# Patient Record
Sex: Male | Born: 1945 | Race: White | Hispanic: No | Smoking: Former smoker
Health system: Southern US, Community
[De-identification: ages and names within clinical notes are randomized; demographics above are authoritative.]

## PROBLEM LIST (undated history)

## (undated) DIAGNOSIS — M109 Gout, unspecified: Secondary | ICD-10-CM

## (undated) DIAGNOSIS — I4891 Unspecified atrial fibrillation: Secondary | ICD-10-CM

## (undated) DIAGNOSIS — F32A Depression, unspecified: Secondary | ICD-10-CM

## (undated) DIAGNOSIS — N400 Enlarged prostate without lower urinary tract symptoms: Secondary | ICD-10-CM

## (undated) DIAGNOSIS — D638 Anemia in other chronic diseases classified elsewhere: Secondary | ICD-10-CM

## (undated) DIAGNOSIS — J9610 Chronic respiratory failure, unspecified whether with hypoxia or hypercapnia: Secondary | ICD-10-CM

## (undated) DIAGNOSIS — G4733 Obstructive sleep apnea (adult) (pediatric): Secondary | ICD-10-CM

## (undated) DIAGNOSIS — R5381 Other malaise: Secondary | ICD-10-CM

## (undated) DIAGNOSIS — J449 Chronic obstructive pulmonary disease, unspecified: Secondary | ICD-10-CM

## (undated) DIAGNOSIS — E1142 Type 2 diabetes mellitus with diabetic polyneuropathy: Secondary | ICD-10-CM

## (undated) DIAGNOSIS — L02215 Cutaneous abscess of perineum: Secondary | ICD-10-CM

## (undated) DIAGNOSIS — F329 Major depressive disorder, single episode, unspecified: Secondary | ICD-10-CM

## (undated) DIAGNOSIS — N179 Acute kidney failure, unspecified: Secondary | ICD-10-CM

## (undated) DIAGNOSIS — I5042 Chronic combined systolic (congestive) and diastolic (congestive) heart failure: Secondary | ICD-10-CM

## (undated) DIAGNOSIS — R339 Retention of urine, unspecified: Secondary | ICD-10-CM

## (undated) DIAGNOSIS — E119 Type 2 diabetes mellitus without complications: Secondary | ICD-10-CM

## (undated) DIAGNOSIS — R9431 Abnormal electrocardiogram [ECG] [EKG]: Secondary | ICD-10-CM

## (undated) DIAGNOSIS — K219 Gastro-esophageal reflux disease without esophagitis: Secondary | ICD-10-CM

## (undated) DIAGNOSIS — J189 Pneumonia, unspecified organism: Secondary | ICD-10-CM

## (undated) DIAGNOSIS — J45909 Unspecified asthma, uncomplicated: Secondary | ICD-10-CM

## (undated) DIAGNOSIS — R55 Syncope and collapse: Secondary | ICD-10-CM

## (undated) DIAGNOSIS — A419 Sepsis, unspecified organism: Secondary | ICD-10-CM

## (undated) DIAGNOSIS — E785 Hyperlipidemia, unspecified: Secondary | ICD-10-CM

## (undated) HISTORY — DX: Hyperlipidemia, unspecified: E78.5

## (undated) HISTORY — DX: Benign prostatic hyperplasia without lower urinary tract symptoms: N40.0

## (undated) HISTORY — DX: Major depressive disorder, single episode, unspecified: F32.9

## (undated) HISTORY — DX: Chronic obstructive pulmonary disease, unspecified: J44.9

## (undated) HISTORY — DX: Type 2 diabetes mellitus without complications: E11.9

## (undated) HISTORY — PX: OTHER SURGICAL HISTORY: SHX169

## (undated) HISTORY — DX: Abnormal electrocardiogram (ECG) (EKG): R94.31

## (undated) HISTORY — DX: Gastro-esophageal reflux disease without esophagitis: K21.9

## (undated) HISTORY — DX: Obstructive sleep apnea (adult) (pediatric): G47.33

## (undated) HISTORY — DX: Retention of urine, unspecified: R33.9

## (undated) HISTORY — DX: Pneumonia, unspecified organism: J18.9

## (undated) HISTORY — DX: Cutaneous abscess of perineum: L02.215

## (undated) HISTORY — DX: Type 2 diabetes mellitus with diabetic polyneuropathy: E11.42

## (undated) HISTORY — DX: Chronic combined systolic (congestive) and diastolic (congestive) heart failure: I50.42

## (undated) HISTORY — DX: Other malaise: R53.81

## (undated) HISTORY — DX: Chronic respiratory failure, unspecified whether with hypoxia or hypercapnia: J96.10

## (undated) HISTORY — DX: Acute kidney failure, unspecified: N17.9

## (undated) HISTORY — DX: Anemia in other chronic diseases classified elsewhere: D63.8

## (undated) HISTORY — DX: Gout, unspecified: M10.9

## (undated) HISTORY — DX: Unspecified atrial fibrillation: I48.91

## (undated) HISTORY — DX: Syncope and collapse: R55

## (undated) HISTORY — PX: RHINOPLASTY: SUR1284

## (undated) HISTORY — DX: Sepsis, unspecified organism: A41.9

## (undated) HISTORY — DX: Depression, unspecified: F32.A

## (undated) HISTORY — DX: Unspecified asthma, uncomplicated: J45.909

---

## 2005-01-30 ENCOUNTER — Ambulatory Visit: Payer: Self-pay | Admitting: Family Medicine

## 2005-02-04 ENCOUNTER — Ambulatory Visit: Payer: Self-pay | Admitting: Family Medicine

## 2005-02-09 ENCOUNTER — Ambulatory Visit: Payer: Self-pay | Admitting: Family Medicine

## 2005-02-18 ENCOUNTER — Ambulatory Visit: Payer: Self-pay | Admitting: Family Medicine

## 2008-02-26 ENCOUNTER — Other Ambulatory Visit: Payer: Self-pay

## 2008-02-26 ENCOUNTER — Emergency Department: Payer: Self-pay | Admitting: Emergency Medicine

## 2009-01-20 ENCOUNTER — Inpatient Hospital Stay: Payer: Self-pay | Admitting: Internal Medicine

## 2009-03-21 ENCOUNTER — Inpatient Hospital Stay: Payer: Self-pay | Admitting: Internal Medicine

## 2010-10-01 ENCOUNTER — Other Ambulatory Visit: Payer: Self-pay | Admitting: Family Medicine

## 2010-10-01 ENCOUNTER — Ambulatory Visit: Payer: Self-pay | Admitting: Family Medicine

## 2012-05-02 ENCOUNTER — Inpatient Hospital Stay: Payer: Self-pay | Admitting: Internal Medicine

## 2012-05-02 LAB — COMPREHENSIVE METABOLIC PANEL
Bilirubin,Total: 0.7 mg/dL (ref 0.2–1.0)
Chloride: 96 mmol/L — ABNORMAL LOW (ref 98–107)
Co2: 34 mmol/L — ABNORMAL HIGH (ref 21–32)
Creatinine: 1.33 mg/dL — ABNORMAL HIGH (ref 0.60–1.30)
EGFR (African American): >60
EGFR (Non-African Amer.): 56 — ABNORMAL LOW
Osmolality: 282 (ref 275–301)
SGOT(AST): 13 U/L — ABNORMAL LOW (ref 15–37)
SGPT (ALT): 25 U/L (ref 12–78)
Sodium: 135 mmol/L — ABNORMAL LOW (ref 136–145)

## 2012-05-02 LAB — URINALYSIS, COMPLETE
Bilirubin,UR: NEGATIVE
Blood: NEGATIVE
Ketone: NEGATIVE
Leukocyte Esterase: NEGATIVE
Ph: 5 (ref 4.5–8.0)
Protein: 30
Specific Gravity: 1.02 (ref 1.003–1.030)
Squamous Epithelial: <1

## 2012-05-02 LAB — CBC
HCT: 49.9 % (ref 40.0–52.0)
HGB: 16.1 g/dL (ref 13.0–18.0)
MCV: 88 fL (ref 80–100)
RBC: 5.67 10*6/uL (ref 4.40–5.90)
WBC: 16.4 10*3/uL — ABNORMAL HIGH (ref 3.8–10.6)

## 2012-05-02 LAB — CK TOTAL AND CKMB (NOT AT ARMC)
CK, Total: 20 U/L — ABNORMAL LOW (ref 35–232)
CK, Total: 70 U/L (ref 35–232)
CK-MB: 1.8 ng/mL (ref 0.5–3.6)

## 2012-05-02 LAB — HEMOGLOBIN A1C: Hemoglobin A1C: 7.7 % — ABNORMAL HIGH (ref 4.2–6.3)

## 2012-05-02 LAB — TROPONIN I: Troponin-I: 0.04 ng/mL

## 2012-05-02 LAB — PRO B NATRIURETIC PEPTIDE: B-Type Natriuretic Peptide: 5784 pg/mL — ABNORMAL HIGH (ref 0–125)

## 2012-05-03 LAB — BASIC METABOLIC PANEL
Anion Gap: 6 — ABNORMAL LOW (ref 7–16)
BUN: 25 mg/dL — ABNORMAL HIGH (ref 7–18)
Calcium, Total: 9.4 mg/dL (ref 8.5–10.1)
Chloride: 99 mmol/L (ref 98–107)
Co2: 31 mmol/L (ref 21–32)
EGFR (African American): >60
EGFR (Non-African Amer.): >60
Glucose: 242 mg/dL — ABNORMAL HIGH (ref 65–99)
Osmolality: 284 (ref 275–301)
Potassium: 4.4 mmol/L (ref 3.5–5.1)
Sodium: 136 mmol/L (ref 136–145)

## 2012-05-03 LAB — CBC WITH DIFFERENTIAL/PLATELET
Basophil #: 0 10*3/uL (ref 0.0–0.1)
Eosinophil #: 0 10*3/uL (ref 0.0–0.7)
Eosinophil %: 0 %
HGB: 14.6 g/dL (ref 13.0–18.0)
MCH: 28.1 pg (ref 26.0–34.0)
Monocyte #: 0.2 x10 3/mm (ref 0.2–1.0)
Monocyte %: 1.4 %
Neutrophil %: 95.1 %
Platelet: 216 10*3/uL (ref 150–440)
RBC: 5.21 10*6/uL (ref 4.40–5.90)
RDW: 17.5 % — ABNORMAL HIGH (ref 11.5–14.5)

## 2012-05-05 LAB — CBC WITH DIFFERENTIAL/PLATELET
Basophil %: 0.1 %
Eosinophil #: 0 10*3/uL (ref 0.0–0.7)
Eosinophil %: 0 %
HCT: 48.3 % (ref 40.0–52.0)
HGB: 15.2 g/dL (ref 13.0–18.0)
Lymphocyte #: 0.3 10*3/uL — ABNORMAL LOW (ref 1.0–3.6)
MCV: 87 fL (ref 80–100)
Monocyte %: 2.4 %
Neutrophil #: 16.7 10*3/uL — ABNORMAL HIGH (ref 1.4–6.5)
Platelet: 204 10*3/uL (ref 150–440)
RBC: 5.54 10*6/uL (ref 4.40–5.90)
WBC: 17.4 10*3/uL — ABNORMAL HIGH (ref 3.8–10.6)

## 2012-05-05 LAB — BASIC METABOLIC PANEL
BUN: 36 mg/dL — ABNORMAL HIGH (ref 7–18)
Calcium, Total: 8.7 mg/dL (ref 8.5–10.1)
Chloride: 101 mmol/L (ref 98–107)
Co2: 29 mmol/L (ref 21–32)
Creatinine: 1.08 mg/dL (ref 0.60–1.30)
EGFR (African American): >60
EGFR (Non-African Amer.): >60
Glucose: 211 mg/dL — ABNORMAL HIGH (ref 65–99)
Osmolality: 287 (ref 275–301)
Potassium: 4.6 mmol/L (ref 3.5–5.1)
Sodium: 136 mmol/L (ref 136–145)

## 2012-05-07 LAB — CBC WITH DIFFERENTIAL/PLATELET
Basophil #: 0 10*3/uL (ref 0.0–0.1)
Basophil %: 0.3 %
Eosinophil %: 0.3 %
HCT: 49.3 % (ref 40.0–52.0)
HGB: 16 g/dL (ref 13.0–18.0)
Lymphocyte %: 2.1 %
MCH: 28.2 pg (ref 26.0–34.0)
Monocyte %: 1.3 %
Neutrophil #: 9.9 10*3/uL — ABNORMAL HIGH (ref 1.4–6.5)
Neutrophil %: 96 %
RBC: 5.67 10*6/uL (ref 4.40–5.90)
RDW: 16.9 % — ABNORMAL HIGH (ref 11.5–14.5)

## 2012-05-07 LAB — CULTURE, BLOOD (SINGLE)

## 2012-07-12 ENCOUNTER — Emergency Department: Payer: Self-pay | Admitting: Emergency Medicine

## 2013-06-12 ENCOUNTER — Inpatient Hospital Stay: Payer: Self-pay | Admitting: Internal Medicine

## 2013-06-12 LAB — URINALYSIS, COMPLETE
Bilirubin,UR: NEGATIVE
Ketone: NEGATIVE
Protein: 30
Squamous Epithelial: 1
WBC UR: 2 /HPF (ref 0–5)

## 2013-06-12 LAB — CK TOTAL AND CKMB (NOT AT ARMC): CK-MB: 1.9 ng/mL (ref 0.5–3.6)

## 2013-06-12 LAB — COMPREHENSIVE METABOLIC PANEL
Alkaline Phosphatase: 135 U/L (ref 50–136)
Anion Gap: 12 (ref 7–16)
BUN: 18 mg/dL (ref 7–18)
Bilirubin,Total: 0.3 mg/dL (ref 0.2–1.0)
Calcium, Total: 9 mg/dL (ref 8.5–10.1)
Chloride: 99 mmol/L (ref 98–107)
Co2: 23 mmol/L (ref 21–32)
Creatinine: 1.29 mg/dL (ref 0.60–1.30)
Glucose: 165 mg/dL — ABNORMAL HIGH (ref 65–99)
Osmolality: 274 (ref 275–301)
Potassium: 3.9 mmol/L (ref 3.5–5.1)
Sodium: 134 mmol/L — ABNORMAL LOW (ref 136–145)

## 2013-06-12 LAB — IRON AND TIBC
Iron Bind.Cap.(Total): 439 ug/dL (ref 250–450)
Iron Saturation: 13 %
Unbound Iron-Bind.Cap.: 380 ug/dL

## 2013-06-12 LAB — CBC
HCT: 28.3 % — ABNORMAL LOW (ref 40.0–52.0)
HGB: 9.1 g/dL — ABNORMAL LOW (ref 13.0–18.0)
MCH: 26.5 pg (ref 26.0–34.0)
MCHC: 32.2 g/dL (ref 32.0–36.0)
RBC: 3.44 10*6/uL — ABNORMAL LOW (ref 4.40–5.90)
RDW: 15.9 % — ABNORMAL HIGH (ref 11.5–14.5)
WBC: 18.4 10*3/uL — ABNORMAL HIGH (ref 3.8–10.6)

## 2013-06-12 LAB — TROPONIN I: Troponin-I: <0.02 ng/mL

## 2013-06-12 LAB — FERRITIN: Ferritin (ARMC): 33 ng/mL (ref 8–388)

## 2013-06-13 LAB — CBC WITH DIFFERENTIAL/PLATELET
Basophil #: 0.1 10*3/uL (ref 0.0–0.1)
Basophil %: 0.7 %
Eosinophil %: 0.7 %
HCT: 23.4 % — ABNORMAL LOW (ref 40.0–52.0)
HGB: 7.6 g/dL — ABNORMAL LOW (ref 13.0–18.0)
Lymphocyte #: 1.1 10*3/uL (ref 1.0–3.6)
Lymphocyte %: 10.1 %
MCH: 26.5 pg (ref 26.0–34.0)
MCHC: 32.6 g/dL (ref 32.0–36.0)
Monocyte %: 5.8 %
Neutrophil #: 9.1 10*3/uL — ABNORMAL HIGH (ref 1.4–6.5)
Platelet: 256 10*3/uL (ref 150–440)
WBC: 11 10*3/uL — ABNORMAL HIGH (ref 3.8–10.6)

## 2013-06-13 LAB — HEMOGLOBIN: HGB: 7.8 g/dL — ABNORMAL LOW (ref 13.0–18.0)

## 2013-06-13 LAB — CK TOTAL AND CKMB (NOT AT ARMC): CK-MB: 1.7 ng/mL (ref 0.5–3.6)

## 2013-06-15 LAB — CBC WITH DIFFERENTIAL/PLATELET
Eosinophil #: 0 10*3/uL (ref 0.0–0.7)
Eosinophil %: 0.4 %
HCT: 22.8 % — ABNORMAL LOW (ref 40.0–52.0)
HGB: 7.3 g/dL — ABNORMAL LOW (ref 13.0–18.0)
Lymphocyte %: 11.7 %
MCH: 26.2 pg (ref 26.0–34.0)
MCV: 82 fL (ref 80–100)
Monocyte #: 0.5 x10 3/mm (ref 0.2–1.0)
Monocyte %: 5.8 %
Neutrophil #: 7.5 10*3/uL — ABNORMAL HIGH (ref 1.4–6.5)
RBC: 2.79 10*6/uL — ABNORMAL LOW (ref 4.40–5.90)
RDW: 15.9 % — ABNORMAL HIGH (ref 11.5–14.5)
WBC: 9.2 10*3/uL (ref 3.8–10.6)

## 2013-06-15 LAB — COMPREHENSIVE METABOLIC PANEL
Albumin: 2.8 g/dL — ABNORMAL LOW (ref 3.4–5.0)
Alkaline Phosphatase: 103 U/L (ref 50–136)
Anion Gap: 5 — ABNORMAL LOW (ref 7–16)
Creatinine: 1.26 mg/dL (ref 0.60–1.30)
EGFR (African American): >60
SGOT(AST): 12 U/L — ABNORMAL LOW (ref 15–37)
SGPT (ALT): 11 U/L — ABNORMAL LOW (ref 12–78)
Total Protein: 5.7 g/dL — ABNORMAL LOW (ref 6.4–8.2)

## 2013-06-16 LAB — HEMOGLOBIN: HGB: 7.2 g/dL — ABNORMAL LOW (ref 13.0–18.0)

## 2013-06-18 ENCOUNTER — Other Ambulatory Visit: Payer: Self-pay

## 2013-06-18 LAB — BASIC METABOLIC PANEL
Anion Gap: 6 — ABNORMAL LOW (ref 7–16)
BUN: 14 mg/dL (ref 7–18)
Chloride: 102 mmol/L (ref 98–107)
Creatinine: 1.43 mg/dL — ABNORMAL HIGH (ref 0.60–1.30)
EGFR (Non-African Amer.): 51 — ABNORMAL LOW
Glucose: 180 mg/dL — ABNORMAL HIGH (ref 65–99)
Osmolality: 281 (ref 275–301)
Potassium: 3.7 mmol/L (ref 3.5–5.1)
Sodium: 138 mmol/L (ref 136–145)

## 2013-06-19 ENCOUNTER — Other Ambulatory Visit: Payer: Self-pay | Admitting: *Deleted

## 2013-06-19 ENCOUNTER — Encounter: Payer: Self-pay | Admitting: Internal Medicine

## 2013-06-19 ENCOUNTER — Non-Acute Institutional Stay (SKILLED_NURSING_FACILITY): Payer: Medicare Other | Admitting: Internal Medicine

## 2013-06-19 DIAGNOSIS — K219 Gastro-esophageal reflux disease without esophagitis: Secondary | ICD-10-CM

## 2013-06-19 DIAGNOSIS — N400 Enlarged prostate without lower urinary tract symptoms: Secondary | ICD-10-CM

## 2013-06-19 DIAGNOSIS — E1142 Type 2 diabetes mellitus with diabetic polyneuropathy: Secondary | ICD-10-CM

## 2013-06-19 DIAGNOSIS — J441 Chronic obstructive pulmonary disease with (acute) exacerbation: Secondary | ICD-10-CM | POA: Insufficient documentation

## 2013-06-19 DIAGNOSIS — J9610 Chronic respiratory failure, unspecified whether with hypoxia or hypercapnia: Secondary | ICD-10-CM

## 2013-06-19 DIAGNOSIS — F32A Depression, unspecified: Secondary | ICD-10-CM

## 2013-06-19 DIAGNOSIS — F329 Major depressive disorder, single episode, unspecified: Secondary | ICD-10-CM

## 2013-06-19 DIAGNOSIS — J4489 Other specified chronic obstructive pulmonary disease: Secondary | ICD-10-CM

## 2013-06-19 DIAGNOSIS — R5381 Other malaise: Secondary | ICD-10-CM

## 2013-06-19 DIAGNOSIS — E1149 Type 2 diabetes mellitus with other diabetic neurological complication: Secondary | ICD-10-CM

## 2013-06-19 DIAGNOSIS — F3289 Other specified depressive episodes: Secondary | ICD-10-CM

## 2013-06-19 DIAGNOSIS — M109 Gout, unspecified: Secondary | ICD-10-CM | POA: Insufficient documentation

## 2013-06-19 DIAGNOSIS — D62 Acute posthemorrhagic anemia: Secondary | ICD-10-CM

## 2013-06-19 DIAGNOSIS — F411 Generalized anxiety disorder: Secondary | ICD-10-CM

## 2013-06-19 DIAGNOSIS — J449 Chronic obstructive pulmonary disease, unspecified: Secondary | ICD-10-CM

## 2013-06-19 HISTORY — DX: Type 2 diabetes mellitus with diabetic polyneuropathy: E11.42

## 2013-06-19 HISTORY — DX: Other malaise: R53.81

## 2013-06-19 HISTORY — DX: Depression, unspecified: F32.A

## 2013-06-19 HISTORY — DX: Chronic respiratory failure, unspecified whether with hypoxia or hypercapnia: J96.10

## 2013-06-19 HISTORY — DX: Benign prostatic hyperplasia without lower urinary tract symptoms: N40.0

## 2013-06-19 HISTORY — DX: Gastro-esophageal reflux disease without esophagitis: K21.9

## 2013-06-19 MED ORDER — TRAMADOL HCL 50 MG PO TABS: ORAL_TABLET | ORAL | Status: AC

## 2013-06-19 NOTE — Progress Notes (Signed)
Patient ID: Bill [name], male   DOB: 14-Jan-1946, 67 y.o.   MRN: [id number]  [facility]  Code status- DNR  Allergies  Allergen Reactions  . Penicillins    Chief Complaint  Patient presents with  . Hospitalization Follow-up    new admit   HPI 67 y/o male patient is here for STR after hospital admission from 10/13-10/17 after a syncopal episode. He has hx of chronic COPD, chf, HTN among others. His syncope was though to be from acute anemia.  CTA chest ruled out pulmonary embolism. GI was consulted and patient refused EGD or colonoscopy. His hemoglobin stablisied at 7-7.5 and he was started on iron supplement. He was also put on PPI bid.  He was seen in his room today and complaints of severe pain in his left elbow that woke him up this am. He mentions having hx of gout and being on diclofenac sodium. He also complaints of pain in his left ankle joint. No other complaints form patient or staff. See ROS  Review of Systems  Constitutional: Positive for malaise/fatigue. Negative for fever, chills and weight loss.  HENT: Negative for congestion, hearing loss and sore throat.   Eyes: Negative for blurred vision.  Respiratory: Negative for cough, shortness of breath and wheezing.   Cardiovascular: Negative for chest pain, palpitations and leg swelling.  Gastrointestinal: Negative for heartburn, nausea, vomiting, abdominal pain, diarrhea and constipation.  Genitourinary: Negative for dysuria and flank pain.  Musculoskeletal: Positive for joint pain. Negative for falls.  Skin: Negative for itching and rash.  Neurological: Negative for dizziness, tingling, tremors, sensory change, speech change, focal weakness, seizures, loss of consciousness, weakness and headaches.  Psychiatric/Behavioral: Negative for depression, suicidal ideas and memory loss. The patient does not have insomnia.    Past Medical History  Diagnosis Date  . COPD (chronic obstructive pulmonary disease)   . Diabetes mellitus  without complication   . Hyperlipidemia   . Depression   . CHF (congestive heart failure)   . Gout   . OSA (obstructive sleep apnea)    Family History  Problem Relation Age of Onset  . Diabetes Mother   . Heart disease Mother    History   Social History  . Marital Status: Unknown    Spouse Name: N/A    Number of Children: N/A  . Years of Education: N/A   Occupational History  . Not on file.   Social History Main Topics  . Smoking status: Former Games developer  . Smokeless tobacco: Not on file  . Alcohol Use: No  . Drug Use: No  . Sexual Activity: Not on file   Other Topics Concern  . Not on file   Social History Narrative   Lives alone   Medication reviewed. See MAR  BP 146/69  Pulse 98  Temp(Src) 97.5 F (36.4 C)  Resp 18  Ht 5\' 11"  (1.803 m)  Wt 279 lb (126.554 kg)  BMI 38.93 kg/m2  SpO2 96%  Physical Exam  Constitutional: He is oriented to person, place, and time.  Obese, in no acute distress  HENT:  Head: Normocephalic and atraumatic.  Eyes: Conjunctivae and EOM are normal. Pupils are equal, round, and reactive to light.  Neck: Normal range of motion. Neck supple. No JVD present. No thyromegaly present.  Cardiovascular: Normal rate and regular rhythm.   Pulmonary/Chest: Effort normal and breath sounds normal. No respiratory distress. He has no wheezes. He has no rales. He exhibits no tenderness.  Abdominal: Soft. Bowel sounds are  normal. He exhibits no distension and no mass. There is no tenderness. There is no rebound and no guarding.  Musculoskeletal: Normal range of motion.  Generalized weakness present, left elbow is swollen, tender, erythematous and warm to touch. Also has swelling and tenderness in left ankle but no erythema.   Lymphadenopathy:    He has no cervical adenopathy.  Neurological: He is alert and oriented to person, place, and time. No cranial nerve deficit.  Skin: Skin is warm and dry. No rash noted. There is pallor.  Psychiatric: He has  a normal mood and affect. His behavior is normal. Judgment and thought content normal.   Labs reviewed from hospital 06/12/13 glu 165, bnp 356, bun 18, cr 1.29, na 134, k 3.9, normal lft, wbc 18.4, hb 9.1, plt 286, mcv 82  Ct head and ct chest with PE protocol were negative  Assessment/plan  Deconditioning- in setting of recent syncope,anemia. Will have him work with PT and OT. His chronic respiratory disease can cause limitation along with his recent gout flare up.   Acute blood loss anemia- monitor h/h periodically. Continue iron supplement 325 mg tid and also continue PPI for now  Acute gout flare- acute flare up of his chronic gout. Continue allopurinol 300 mg bid. Will have him on colchicine 1.2 mg x1 now and 0.6 mg in 2 hours if no improvement and reassess. Will also have him on tramadol 50 mg q12h for 5 days and then prn for pain  Diabetes mellitus type 2- will monitor his cbg. To continue janumet 50/500 bid  Copd- chronic and on o2 by nasal canula. Continue spiriva, advair and albuterol inhalers/ nebs. Will continue his chronic prednsione 10 mg daily  Depression- mood stable currently, will continue his citalopram 40 mg daily and olanzapine 15 mg daily. Will continue his ambien to help aid him sleep  Peripheral neuropathy- to keep cbg under control and continue gabapentin 100 mg bid for now  Hypertension- bp under control at present. Continue amlodipine 10 mg daily and lasix 80 mg daily. Monitor bp readings. Continue kcl supplement. Check bmp  Anxiety- calm this visit. Continue klonopin prn for now and wean prior to discharge from facility if possible  GERD- continue PPI for now protonix 40 mg bid  BPH- continue his home regimen flomax for now  Essential tremors- continue primidone 250 mg bid for now and alos on anxiety medications  Labs ordered- cbc, bmp  Reviewed care plan with patient and nursing supervisor.

## 2013-06-19 NOTE — Telephone Encounter (Signed)
rx refilled per protocol  

## 2013-07-12 ENCOUNTER — Emergency Department (HOSPITAL_COMMUNITY)
Admission: EM | Admit: 2013-07-12 | Discharge: 2013-07-12 | Disposition: A | Discharge status: Skilled Nursing Facility | Payer: Medicare Other | Attending: Emergency Medicine | Admitting: Emergency Medicine

## 2013-07-12 ENCOUNTER — Emergency Department (HOSPITAL_COMMUNITY): Payer: Medicare Other

## 2013-07-12 ENCOUNTER — Encounter (HOSPITAL_COMMUNITY): Payer: Self-pay | Admitting: Emergency Medicine

## 2013-07-12 DIAGNOSIS — M109 Gout, unspecified: Secondary | ICD-10-CM | POA: Insufficient documentation

## 2013-07-12 DIAGNOSIS — R0600 Dyspnea, unspecified: Secondary | ICD-10-CM

## 2013-07-12 DIAGNOSIS — F3289 Other specified depressive episodes: Secondary | ICD-10-CM | POA: Insufficient documentation

## 2013-07-12 DIAGNOSIS — Z9981 Dependence on supplemental oxygen: Secondary | ICD-10-CM | POA: Insufficient documentation

## 2013-07-12 DIAGNOSIS — Z79899 Other long term (current) drug therapy: Secondary | ICD-10-CM | POA: Insufficient documentation

## 2013-07-12 DIAGNOSIS — I10 Essential (primary) hypertension: Secondary | ICD-10-CM | POA: Insufficient documentation

## 2013-07-12 DIAGNOSIS — I509 Heart failure, unspecified: Secondary | ICD-10-CM | POA: Insufficient documentation

## 2013-07-12 DIAGNOSIS — G4733 Obstructive sleep apnea (adult) (pediatric): Secondary | ICD-10-CM | POA: Insufficient documentation

## 2013-07-12 DIAGNOSIS — Z794 Long term (current) use of insulin: Secondary | ICD-10-CM | POA: Insufficient documentation

## 2013-07-12 DIAGNOSIS — J441 Chronic obstructive pulmonary disease with (acute) exacerbation: Secondary | ICD-10-CM | POA: Insufficient documentation

## 2013-07-12 DIAGNOSIS — E119 Type 2 diabetes mellitus without complications: Secondary | ICD-10-CM | POA: Insufficient documentation

## 2013-07-12 DIAGNOSIS — Z880 Allergy status to penicillin: Secondary | ICD-10-CM | POA: Insufficient documentation

## 2013-07-12 DIAGNOSIS — F329 Major depressive disorder, single episode, unspecified: Secondary | ICD-10-CM | POA: Insufficient documentation

## 2013-07-12 DIAGNOSIS — Z87891 Personal history of nicotine dependence: Secondary | ICD-10-CM | POA: Insufficient documentation

## 2013-07-12 LAB — URINALYSIS, ROUTINE W REFLEX MICROSCOPIC
Glucose, UA: NEGATIVE mg/dL
Hgb urine dipstick: NEGATIVE
Ketones, ur: NEGATIVE mg/dL
Nitrite: NEGATIVE
Protein, ur: NEGATIVE mg/dL
Specific Gravity, Urine: 1.015 (ref 1.005–1.030)
Urobilinogen, UA: 0.2 mg/dL (ref 0.0–1.0)

## 2013-07-12 LAB — CBC WITH DIFFERENTIAL/PLATELET
Basophils Absolute: 0 10*3/uL (ref 0.0–0.1)
Basophils Relative: 0 % (ref 0–1)
Eosinophils Absolute: 0.4 10*3/uL (ref 0.0–0.7)
Hemoglobin: 8.9 g/dL — ABNORMAL LOW (ref 13.0–17.0)
MCH: 25.9 pg — ABNORMAL LOW (ref 26.0–34.0)
MCHC: 30.2 g/dL (ref 30.0–36.0)
Monocytes Absolute: 0.7 10*3/uL (ref 0.1–1.0)
Monocytes Relative: 7 % (ref 3–12)
Neutro Abs: 7.7 10*3/uL (ref 1.7–7.7)
Neutrophils Relative %: 78 % — ABNORMAL HIGH (ref 43–77)
RDW: 19.2 % — ABNORMAL HIGH (ref 11.5–15.5)
WBC: 9.8 10*3/uL (ref 4.0–10.5)

## 2013-07-12 LAB — BASIC METABOLIC PANEL
BUN: 18 mg/dL (ref 6–23)
CO2: 28 mEq/L (ref 19–32)
Calcium: 8.8 mg/dL (ref 8.4–10.5)
Chloride: 97 mEq/L (ref 96–112)
Creatinine, Ser: 1.09 mg/dL (ref 0.50–1.35)
Glucose, Bld: 161 mg/dL — ABNORMAL HIGH (ref 70–99)
Sodium: 136 mEq/L (ref 135–145)

## 2013-07-12 LAB — PRO B NATRIURETIC PEPTIDE: Pro B Natriuretic peptide (BNP): 102.7 pg/mL (ref 0–125)

## 2013-07-12 MED ORDER — IPRATROPIUM BROMIDE 0.02 % IN SOLN
0.5000 mg | RESPIRATORY_TRACT | Status: DC
  Administered 2013-07-12: 0.5 mg via RESPIRATORY_TRACT
  Filled 2013-07-12: qty 2.5

## 2013-07-12 MED ORDER — ALBUTEROL (5 MG/ML) CONTINUOUS INHALATION SOLN
10.0000 mg/h | INHALATION_SOLUTION | Freq: Once | RESPIRATORY_TRACT | Status: AC
  Administered 2013-07-12: 10 mg/h via RESPIRATORY_TRACT
  Filled 2013-07-12: qty 20

## 2013-07-12 MED ORDER — ALBUTEROL SULFATE (5 MG/ML) 0.5% IN NEBU
5.0000 mg | INHALATION_SOLUTION | RESPIRATORY_TRACT | Status: DC
  Administered 2013-07-12: 5 mg via RESPIRATORY_TRACT
  Filled 2013-07-12: qty 1

## 2013-07-12 MED ORDER — ALBUTEROL SULFATE (5 MG/ML) 0.5% IN NEBU
5.0000 mg | INHALATION_SOLUTION | Freq: Once | RESPIRATORY_TRACT | Status: AC
  Administered 2013-07-12: 5 mg via RESPIRATORY_TRACT
  Filled 2013-07-12: qty 1

## 2013-07-12 MED ORDER — PREDNISONE 10 MG PO TABS: 20.0000 mg | ORAL_TABLET | Freq: Two times a day (BID) | ORAL | Status: DC

## 2013-07-12 MED ORDER — IPRATROPIUM BROMIDE 0.02 % IN SOLN
0.5000 mg | Freq: Once | RESPIRATORY_TRACT | Status: AC
  Administered 2013-07-12: 0.5 mg via RESPIRATORY_TRACT
  Filled 2013-07-12: qty 2.5

## 2013-07-12 MED ORDER — METHYLPREDNISOLONE SODIUM SUCC 125 MG IJ SOLR
125.0000 mg | Freq: Once | INTRAMUSCULAR | Status: AC
  Administered 2013-07-12: 125 mg via INTRAVENOUS
  Filled 2013-07-12: qty 2

## 2013-07-12 NOTE — ED Notes (Signed)
Called nursing home.   Care transferred and report given to Chyrl Civatte, RN (ADON).

## 2013-07-12 NOTE — ED Provider Notes (Signed)
Care soon from Dr. Rhunette Croft at shift change. Patient has a history of COPD and CHF. He is currently at a rehabilitation facility. He was recently admitted to [facility] for low blood count and dark stools. He was assumed to have had a GI bleed, however he did not feel as though his respiratory status was stable enough to pursue endoscopy. Family states that his hemoglobin was 7, now it is 8.9. Remainder of workup reveals chronic changes on the chest x-ray and stable BNP and EKG. He is feeling markedly improved after breathing treatments in the ER. I suspect that his symptoms are more related to an exacerbation of COPD than fluid retention, however he does not believe he is receiving his Lasix at the rehabilitation facility. He wants to go home and he appears to be saturating adequately on his normal level of supplemental oxygen. I feel as though this is a reasonable course of action and we'll make sure he is receiving his Lasix and albuterol treatments.  Geoffery Lyons, MD 07/12/13 [phone]

## 2013-07-12 NOTE — ED Notes (Signed)
Patient presents to ED via [facility]. Pt is from [facility] where staff and patient report increased shortness of breath onset this am. Pt states that he normally is able to do most things on his own but the past few days has needed more help due to becoming more short of breath. Pt normally wears 3L O2 via [place], currently on 4L- O2 sat 100%. Lungs clear, slightly diminished in bases. Pt is slight pale and diaphoretic upon arrival. A&Ox4.

## 2013-07-12 NOTE — ED Notes (Signed)
Dr. Judd Lien at bedside to speak with patient.

## 2013-07-19 NOTE — ED Provider Notes (Signed)
CSN: [id number]     Arrival date & time 07/12/13  0247 History   First MD Initiated Contact with Patient 07/12/13 0330     Chief Complaint  Patient presents with  . Shortness of Breath   (Consider location/radiation/quality/duration/timing/severity/associated sxs/prior Treatment) HPI Comments: Patient presents to ED via [facility]. Pt is from [facility] where staff and patient report increased shortness of breath onset this am. PMHx of COPD, DM, CHF, HTN. Pt states that he normally is able to do most things on his own but the past few days has needed more help due to becoming more short of breath. Pt normally wears 3L O2 via [place], currently on 4L- O2 sat 100%. Pt was given some tretment by EMS and feels a lot better. No n/v/f/c. No new cough. Has hx of gi bleeds and transfusion.  Patient is a 67 y.o. male presenting with shortness of breath. The history is provided by the patient.  Shortness of Breath Associated symptoms: no abdominal pain, no chest pain and no cough     Past Medical History  Diagnosis Date  . COPD (chronic obstructive pulmonary disease)   . Diabetes mellitus without complication   . Hyperlipidemia   . Depression   . CHF (congestive heart failure)   . Gout   . OSA (obstructive sleep apnea)    History reviewed. No pertinent past surgical history. Family History  Problem Relation Age of Onset  . Diabetes Mother   . Heart disease Mother    History  Substance Use Topics  . Smoking status: Former Games developer  . Smokeless tobacco: Not on file  . Alcohol Use: No    Review of Systems  Constitutional: Negative for activity change and appetite change.  Respiratory: Positive for shortness of breath. Negative for cough.   Cardiovascular: Negative for chest pain.  Gastrointestinal: Negative for abdominal pain.  Genitourinary: Negative for dysuria.    Allergies  Penicillins  Home Medications   Current Outpatient Rx  Name  Route  Sig  Dispense  Refill  . acetaminophen  (TYLENOL) 325 MG tablet   Oral   Take 650 mg by mouth every 4 (four) hours as needed for mild pain.         Marland Kitchen albuterol (PROVENTIL HFA;VENTOLIN HFA) 108 (90 BASE) MCG/ACT inhaler   Inhalation   Inhale 1 puff into the lungs every 6 (six) hours as needed for wheezing or shortness of breath.         Marland Kitchen albuterol (PROVENTIL) (2.5 MG/3ML) 0.083% nebulizer solution   Nebulization   Take 2.5 mg by nebulization every 6 (six) hours as needed for wheezing or shortness of breath.         . allopurinol (ZYLOPRIM) 300 MG tablet   Oral   Take 300 mg by mouth 2 (two) times daily.         Marland Kitchen amLODipine (NORVASC) 10 MG tablet   Oral   Take 10 mg by mouth daily.         . budesonide-formoterol (SYMBICORT) 160-4.5 MCG/ACT inhaler   Inhalation   Inhale 2 puffs into the lungs 2 (two) times daily.         . citalopram (CELEXA) 40 MG tablet   Oral   Take 40 mg by mouth daily.         . clonazePAM (KLONOPIN) 2 MG tablet   Oral   Take 2 mg by mouth 3 (three) times daily as needed for anxiety.         Marland Kitchen  ferrous sulfate 325 (65 FE) MG tablet   Oral   Take 325 mg by mouth 3 (three) times daily with meals.         . furosemide (LASIX) 80 MG tablet   Oral   Take 80 mg by mouth daily.         Marland Kitchen gabapentin (NEURONTIN) 100 MG capsule   Oral   Take 100 mg by mouth 2 (two) times daily.         . insulin lispro (HUMALOG) 100 UNIT/ML injection   Subcutaneous   Inject 2-10 Units into the skin 3 (three) times daily before meals. Sliding scale         . OLANZapine (ZYPREXA) 10 MG tablet   Oral   Take 10 mg by mouth daily.         . pantoprazole (PROTONIX) 40 MG tablet   Oral   Take 40 mg by mouth 2 (two) times daily.         . potassium chloride SA (K-DUR,KLOR-CON) 20 MEQ tablet   Oral   Take 20 mEq by mouth daily.         . predniSONE (DELTASONE) 10 MG tablet   Oral   Take 10 mg by mouth daily with breakfast.         . primidone (MYSOLINE) 250 MG tablet    Oral   Take 250 mg by mouth 2 (two) times daily.         . sitaGLIPtin-metformin (JANUMET) 50-500 MG per tablet   Oral   Take 1 tablet by mouth 2 (two) times daily with a meal.         . tamsulosin (FLOMAX) 0.4 MG CAPS capsule   Oral   Take 0.4 mg by mouth daily.         Marland Kitchen tiotropium (SPIRIVA) 18 MCG inhalation capsule   Inhalation   Place 18 mcg into inhaler and inhale daily.         . traMADol (ULTRAM) 50 MG tablet      Take 1 tablet by mouth every 12 hours as needed for pain.   60 tablet   0   . zolpidem (AMBIEN) 10 MG tablet   Oral   Take 10 mg by mouth at bedtime as needed for sleep.         . predniSONE (DELTASONE) 10 MG tablet   Oral   Take 2 tablets (20 mg total) by mouth 2 (two) times daily.   20 tablet   0    BP 129/72  Pulse 106  Temp(Src) 97.9 F (36.6 C) (Oral)  Resp 20  Ht 5\' 11"  (1.803 m)  Wt 278 lb (126.1 kg)  BMI 38.79 kg/m2  SpO2 96% Physical Exam  Constitutional: He is oriented to person, place, and time. He appears well-developed.  HENT:  Head: Normocephalic and atraumatic.  Eyes: Conjunctivae and EOM are normal. Pupils are equal, round, and reactive to light.  Neck: Normal range of motion. Neck supple.  Cardiovascular: Normal rate and regular rhythm.   Pulmonary/Chest: Effort normal. He has wheezes.  Abdominal: Soft. Bowel sounds are normal. He exhibits no distension. There is no tenderness. There is no rebound and no guarding.  Neurological: He is alert and oriented to person, place, and time.  Skin: Skin is warm.    ED Course  Procedures (including critical care time) Labs Review Labs Reviewed  CBC WITH DIFFERENTIAL - Abnormal; Notable for the following:    RBC 3.43 (*)  Hemoglobin 8.9 (*)    HCT 29.5 (*)    MCH 25.9 (*)    RDW 19.2 (*)    Neutrophils Relative % 78 (*)    Lymphocytes Relative 11 (*)    All other components within normal limits  BASIC METABOLIC PANEL - Abnormal; Notable for the following:     Glucose, Bld 161 (*)    GFR calc non Af Amer 69 (*)    GFR calc Af Amer 80 (*)    All other components within normal limits  PRO B NATRIURETIC PEPTIDE  URINALYSIS, ROUTINE W REFLEX MICROSCOPIC  TROPONIN I  APTT  PROTIME-INR   Imaging Review No results found.  EKG Interpretation    Date/Time:    Ventricular Rate:  92 PR Interval:  175 QRS Duration: 125 QT Interval:  387 QTC Calculation: 479 R Axis:   -114 Text Interpretation:  Age not entered, assumed to be  67 years old for purpose of ECG interpretation Sinus tachycardia Supraventricular bigeminy Consider dextrocardia ED PHYSICIAN INTERPRETATION AVAILABLE IN [facility]            MDM   1. Dyspnea   2. COPD exacerbation    Pt comes in with cc of dyspnea on exertion. Appears comfortable here. Labs here are WNL. CXR is clear. EKG shows no acute findings. Signout to Dr. Judd Lien - if not better- pt to be admitted, otherwise d.c to rehab. Pt wants to return as well.  Derwood Kaplan, MD 07/19/13 [phone]

## 2013-07-21 ENCOUNTER — Non-Acute Institutional Stay (SKILLED_NURSING_FACILITY): Payer: Medicare Other | Admitting: Nurse Practitioner

## 2013-07-21 DIAGNOSIS — E1142 Type 2 diabetes mellitus with diabetic polyneuropathy: Secondary | ICD-10-CM

## 2013-07-21 DIAGNOSIS — R5381 Other malaise: Secondary | ICD-10-CM

## 2013-07-21 DIAGNOSIS — D62 Acute posthemorrhagic anemia: Secondary | ICD-10-CM

## 2013-07-21 DIAGNOSIS — R609 Edema, unspecified: Secondary | ICD-10-CM

## 2013-07-21 DIAGNOSIS — J9610 Chronic respiratory failure, unspecified whether with hypoxia or hypercapnia: Secondary | ICD-10-CM

## 2013-07-21 DIAGNOSIS — J449 Chronic obstructive pulmonary disease, unspecified: Secondary | ICD-10-CM

## 2013-07-21 DIAGNOSIS — E1149 Type 2 diabetes mellitus with other diabetic neurological complication: Secondary | ICD-10-CM

## 2013-07-26 ENCOUNTER — Non-Acute Institutional Stay (SKILLED_NURSING_FACILITY): Payer: Medicare Other | Admitting: Nurse Practitioner

## 2013-07-26 ENCOUNTER — Inpatient Hospital Stay (HOSPITAL_COMMUNITY)
Admission: EM | Admit: 2013-07-26 | Discharge: 2013-07-29 | DRG: 871 | Disposition: A | Discharge status: Skilled Nursing Facility | Payer: Medicare Other | Attending: Internal Medicine | Admitting: Internal Medicine

## 2013-07-26 ENCOUNTER — Emergency Department (HOSPITAL_COMMUNITY): Payer: Medicare Other

## 2013-07-26 ENCOUNTER — Encounter (HOSPITAL_COMMUNITY): Payer: Self-pay | Admitting: Emergency Medicine

## 2013-07-26 DIAGNOSIS — N179 Acute kidney failure, unspecified: Secondary | ICD-10-CM | POA: Diagnosis present

## 2013-07-26 DIAGNOSIS — J9610 Chronic respiratory failure, unspecified whether with hypoxia or hypercapnia: Secondary | ICD-10-CM

## 2013-07-26 DIAGNOSIS — G4733 Obstructive sleep apnea (adult) (pediatric): Secondary | ICD-10-CM | POA: Diagnosis present

## 2013-07-26 DIAGNOSIS — J9600 Acute respiratory failure, unspecified whether with hypoxia or hypercapnia: Secondary | ICD-10-CM | POA: Diagnosis present

## 2013-07-26 DIAGNOSIS — E1149 Type 2 diabetes mellitus with other diabetic neurological complication: Secondary | ICD-10-CM | POA: Diagnosis present

## 2013-07-26 DIAGNOSIS — J441 Chronic obstructive pulmonary disease with (acute) exacerbation: Secondary | ICD-10-CM | POA: Diagnosis present

## 2013-07-26 DIAGNOSIS — A419 Sepsis, unspecified organism: Secondary | ICD-10-CM | POA: Diagnosis present

## 2013-07-26 DIAGNOSIS — N400 Enlarged prostate without lower urinary tract symptoms: Secondary | ICD-10-CM

## 2013-07-26 DIAGNOSIS — J189 Pneumonia, unspecified organism: Secondary | ICD-10-CM

## 2013-07-26 DIAGNOSIS — E785 Hyperlipidemia, unspecified: Secondary | ICD-10-CM | POA: Diagnosis present

## 2013-07-26 DIAGNOSIS — J9620 Acute and chronic respiratory failure, unspecified whether with hypoxia or hypercapnia: Secondary | ICD-10-CM | POA: Diagnosis present

## 2013-07-26 DIAGNOSIS — Z794 Long term (current) use of insulin: Secondary | ICD-10-CM

## 2013-07-26 DIAGNOSIS — Z66 Do not resuscitate: Secondary | ICD-10-CM | POA: Diagnosis present

## 2013-07-26 DIAGNOSIS — F329 Major depressive disorder, single episode, unspecified: Secondary | ICD-10-CM | POA: Diagnosis present

## 2013-07-26 DIAGNOSIS — J449 Chronic obstructive pulmonary disease, unspecified: Secondary | ICD-10-CM

## 2013-07-26 DIAGNOSIS — R5381 Other malaise: Secondary | ICD-10-CM

## 2013-07-26 DIAGNOSIS — E1142 Type 2 diabetes mellitus with diabetic polyneuropathy: Secondary | ICD-10-CM

## 2013-07-26 DIAGNOSIS — K219 Gastro-esophageal reflux disease without esophagitis: Secondary | ICD-10-CM

## 2013-07-26 DIAGNOSIS — I10 Essential (primary) hypertension: Secondary | ICD-10-CM | POA: Diagnosis present

## 2013-07-26 DIAGNOSIS — I509 Heart failure, unspecified: Secondary | ICD-10-CM | POA: Diagnosis present

## 2013-07-26 DIAGNOSIS — D72829 Elevated white blood cell count, unspecified: Secondary | ICD-10-CM | POA: Diagnosis present

## 2013-07-26 DIAGNOSIS — F411 Generalized anxiety disorder: Secondary | ICD-10-CM

## 2013-07-26 DIAGNOSIS — D62 Acute posthemorrhagic anemia: Secondary | ICD-10-CM

## 2013-07-26 DIAGNOSIS — F3289 Other specified depressive episodes: Secondary | ICD-10-CM | POA: Diagnosis present

## 2013-07-26 DIAGNOSIS — M109 Gout, unspecified: Secondary | ICD-10-CM | POA: Diagnosis present

## 2013-07-26 DIAGNOSIS — Z79899 Other long term (current) drug therapy: Secondary | ICD-10-CM

## 2013-07-26 DIAGNOSIS — Z87891 Personal history of nicotine dependence: Secondary | ICD-10-CM

## 2013-07-26 HISTORY — DX: Pneumonia, unspecified organism: J18.9

## 2013-07-26 LAB — COMPREHENSIVE METABOLIC PANEL
ALT: 22 U/L (ref 0–53)
AST: 19 U/L (ref 0–37)
Alkaline Phosphatase: 131 U/L — ABNORMAL HIGH (ref 39–117)
CO2: 25 mEq/L (ref 19–32)
Calcium: 8.8 mg/dL (ref 8.4–10.5)
Chloride: 96 mEq/L (ref 96–112)
GFR calc non Af Amer: 46 mL/min — ABNORMAL LOW (ref >90)
Potassium: 3.8 mEq/L (ref 3.5–5.1)
Sodium: 137 mEq/L (ref 135–145)
Total Bilirubin: 0.7 mg/dL (ref 0.3–1.2)

## 2013-07-26 LAB — CBC
HCT: 36.4 % — ABNORMAL LOW (ref 39.0–52.0)
Hemoglobin: 11.3 g/dL — ABNORMAL LOW (ref 13.0–17.0)
MCHC: 31 g/dL (ref 30.0–36.0)
RBC: 4.31 MIL/uL (ref 4.22–5.81)
WBC: 26.8 10*3/uL — ABNORMAL HIGH (ref 4.0–10.5)

## 2013-07-26 LAB — POCT I-STAT TROPONIN I: Troponin i, poc: 0.05 ng/mL (ref 0.00–0.08)

## 2013-07-26 LAB — PRO B NATRIURETIC PEPTIDE: Pro B Natriuretic peptide (BNP): 695.5 pg/mL — ABNORMAL HIGH (ref 0–125)

## 2013-07-26 MED ORDER — IPRATROPIUM BROMIDE 0.02 % IN SOLN
0.5000 mg | RESPIRATORY_TRACT | Status: DC
  Administered 2013-07-26 – 2013-07-27 (×2): 0.5 mg via RESPIRATORY_TRACT
  Filled 2013-07-26 (×2): qty 2.5

## 2013-07-26 MED ORDER — VANCOMYCIN HCL 10 G IV SOLR
2500.0000 mg | Freq: Once | INTRAVENOUS | Status: AC
  Administered 2013-07-26: 2500 mg via INTRAVENOUS
  Filled 2013-07-26: qty 2500

## 2013-07-26 MED ORDER — ACETAMINOPHEN 650 MG RE SUPP
650.0000 mg | RECTAL | Status: DC | PRN
  Administered 2013-07-26: 650 mg via RECTAL
  Filled 2013-07-26: qty 1

## 2013-07-26 MED ORDER — DEXTROSE 5 % IV SOLN
2.0000 g | Freq: Once | INTRAVENOUS | Status: AC
  Administered 2013-07-26: 2 g via INTRAVENOUS

## 2013-07-26 MED ORDER — SODIUM CHLORIDE 0.9 % IV SOLN
INTRAVENOUS | Status: DC
  Administered 2013-07-26: 75 mL/h via INTRAVENOUS

## 2013-07-26 MED ORDER — ALBUTEROL SULFATE (5 MG/ML) 0.5% IN NEBU
2.5000 mg | INHALATION_SOLUTION | RESPIRATORY_TRACT | Status: DC
  Administered 2013-07-26 – 2013-07-27 (×6): 2.5 mg via RESPIRATORY_TRACT
  Filled 2013-07-26 (×5): qty 0.5

## 2013-07-26 NOTE — ED Provider Notes (Signed)
CSN: [id number]     Arrival date & time 07/26/13  1946 History   First MD Initiated Contact with Patient 07/26/13 2001     Chief Complaint  Patient presents with  . Shortness of Breath   (Consider location/radiation/quality/duration/timing/severity/associated sxs/prior Treatment) Patient is a 67 y.o. male presenting with shortness of breath.  Shortness of Breath Severity:  Severe Onset quality:  Gradual Duration:  2 days Timing:  Constant Progression:  Worsening Chronicity:  Chronic Relieved by:  Nothing Worsened by:  Activity, exertion and movement Associated symptoms: fever and wheezing   Associated symptoms: no abdominal pain, no chest pain and no headaches   Risk factors: obesity and tobacco use     Chief complaint breath history provided by patient and EMS  67 year old male past medical history significant for COPD, diabetes, CHF, OSA who presents with short of breath. Patient states he was recently diagnosed bilateral basilar pneumonias. He's had approximately 2 days now of worsening shortness of breath. States has been constant, severe and worsening. He does report is been taking his inhalers and COPD medications with minimal relief. Also developed a fever today which was unmeasured at home. With continued worsening shortness of breath EMS was called and patient was brought to the ED  Past Medical History  Diagnosis Date  . COPD (chronic obstructive pulmonary disease)   . Diabetes mellitus without complication   . Hyperlipidemia   . Depression   . CHF (congestive heart failure)   . Gout   . OSA (obstructive sleep apnea)    History reviewed. No pertinent past surgical history. Family History  Problem Relation Age of Onset  . Diabetes Mother   . Heart disease Mother    History  Substance Use Topics  . Smoking status: Former Games developer  . Smokeless tobacco: Not on file  . Alcohol Use: No    Review of Systems  Constitutional: Positive for fever.  Respiratory:  Positive for shortness of breath and wheezing.   Cardiovascular: Negative for chest pain.  Gastrointestinal: Negative for abdominal pain.  Genitourinary: Negative for dysuria.  Neurological: Negative for headaches.  Psychiatric/Behavioral: Negative for confusion.  All other systems reviewed and are negative.    Allergies  Penicillins  Home Medications   Current Outpatient Rx  Name  Route  Sig  Dispense  Refill  . acetaminophen (TYLENOL) 325 MG tablet   Oral   Take 650 mg by mouth every 4 (four) hours as needed for mild pain.         Marland Kitchen albuterol (PROVENTIL HFA;VENTOLIN HFA) 108 (90 BASE) MCG/ACT inhaler   Inhalation   Inhale 1 puff into the lungs every 6 (six) hours as needed for wheezing or shortness of breath.         Marland Kitchen albuterol (PROVENTIL) (2.5 MG/3ML) 0.083% nebulizer solution   Nebulization   Take 2.5 mg by nebulization every 6 (six) hours as needed for wheezing or shortness of breath.         . allopurinol (ZYLOPRIM) 300 MG tablet   Oral   Take 300 mg by mouth 2 (two) times daily.         Marland Kitchen amLODipine (NORVASC) 10 MG tablet   Oral   Take 10 mg by mouth daily.         . budesonide-formoterol (SYMBICORT) 160-4.5 MCG/ACT inhaler   Inhalation   Inhale 2 puffs into the lungs 2 (two) times daily.         . citalopram (CELEXA) 40 MG tablet  Oral   Take 40 mg by mouth daily.         . clonazePAM (KLONOPIN) 2 MG tablet   Oral   Take 2 mg by mouth 3 (three) times daily as needed for anxiety.         . ferrous sulfate 325 (65 FE) MG tablet   Oral   Take 325 mg by mouth 3 (three) times daily with meals.         . furosemide (LASIX) 80 MG tablet   Oral   Take 80 mg by mouth daily.         Marland Kitchen gabapentin (NEURONTIN) 100 MG capsule   Oral   Take 100 mg by mouth 2 (two) times daily.         . insulin lispro (HUMALOG) 100 UNIT/ML injection   Subcutaneous   Inject 2-10 Units into the skin 3 (three) times daily before meals. Sliding scale          . OLANZapine (ZYPREXA) 10 MG tablet   Oral   Take 10 mg by mouth daily.         . pantoprazole (PROTONIX) 40 MG tablet   Oral   Take 40 mg by mouth 2 (two) times daily.         . potassium chloride SA (K-DUR,KLOR-CON) 20 MEQ tablet   Oral   Take 20 mEq by mouth daily.         . predniSONE (DELTASONE) 10 MG tablet   Oral   Take 10 mg by mouth daily with breakfast.         . predniSONE (DELTASONE) 10 MG tablet   Oral   Take 2 tablets (20 mg total) by mouth 2 (two) times daily.   20 tablet   0   . primidone (MYSOLINE) 250 MG tablet   Oral   Take 250 mg by mouth 2 (two) times daily.         . sitaGLIPtin-metformin (JANUMET) 50-500 MG per tablet   Oral   Take 1 tablet by mouth 2 (two) times daily with a meal.         . tamsulosin (FLOMAX) 0.4 MG CAPS capsule   Oral   Take 0.4 mg by mouth daily.         Marland Kitchen tiotropium (SPIRIVA) 18 MCG inhalation capsule   Inhalation   Place 18 mcg into inhaler and inhale daily.         . traMADol (ULTRAM) 50 MG tablet      Take 1 tablet by mouth every 12 hours as needed for pain.   60 tablet   0   . zolpidem (AMBIEN) 10 MG tablet   Oral   Take 10 mg by mouth at bedtime as needed for sleep.          BP 134/62  Pulse 120  Temp(Src) 102.5 F (39.2 C) (Axillary)  Resp 34  SpO2 99% Physical Exam  Nursing note and vitals reviewed. Constitutional: He is oriented to person, place, and time. He appears well-developed and well-nourished.  Mild respiratory distress  HENT:  Head: Normocephalic and atraumatic.  Eyes: EOM are normal. Pupils are equal, round, and reactive to light.  Neck: Normal range of motion.  Cardiovascular: Regular rhythm and intact distal pulses.   Tachycardic  Pulmonary/Chest: No respiratory distress. He exhibits no tenderness.  Tachypneic, bilateral diffuse wheezing, mild distress. Requiring BiPAP  Abdominal: Soft. He exhibits no distension. There is no tenderness.  Musculoskeletal: Normal  range  of motion.  Neurological: He is alert and oriented to person, place, and time. No cranial nerve deficit. He exhibits normal muscle tone. Coordination normal.  Skin: Skin is warm and dry. No rash noted.  Psychiatric: He has a normal mood and affect.    ED Course  Procedures (including critical care time) Labs Review Labs Reviewed  CULTURE, BLOOD (ROUTINE X 2)  CULTURE, BLOOD (ROUTINE X 2)  CBC  PRO B NATRIURETIC PEPTIDE  COMPREHENSIVE METABOLIC PANEL   Imaging Review No results found.  EKG Interpretation   None       MDM  No diagnosis found.  67 year old male multiple comorbidities arrives in the emergency department Tachycardic, tachypneic and hypoxic in the 80s. Reports being previously diagnosed bibasilar pneumonias. Patient does have elevated temp here. On arrival patient was placed on BiPAP secondary to hypoxia and increased work of breathing. Labs significant for leukocytosis 26.8. Chest x-ray with bibasilar atelectasis or infiltrate. Concerning for pneumonia. No significant elevation of BNP. Chest x-ray shows no effusions. Doubt CHF exacerbation. While in the emergency department patient was weaned off BiPAP placed on nonrebreather. Patient also with COPD. Diffuse wheezing bilaterally. In addition to likely pneumonia, also likely with COPD exacerbation. EMS provided Solu-Medrol 125. Patient was given 2 nebs both with EMS in the emergency department with some some relief of symptoms. Patient does have DNR/DNI and does not wish invasive procedures. Was started on vancomycin and cefepime in the emergency department for presumed pneumonia in conjunction with likely COPD exacerbation..  Medicine consult for admission and will admit to step down unit. On reexamination patient significantly improved condition now on nasal cannula speaking in full senses. Patient denies any chest pain. EKG sinus tach. Initial troponin negative. Not likely ACS. Believe appropriate for step down unit  remained stable in the emergency department until transfer.   Patient discussed with attending Dr. Romeo Apple.        Bridgett Larsson, MD 07/26/13 2239

## 2013-07-26 NOTE — Progress Notes (Signed)
Patient placed on BiPAP per MD order.  BS: wheezing bilaterally and diminished bases.  Sats 99%.  RT will continue to monitor.

## 2013-07-26 NOTE — ED Notes (Signed)
Presents from [facility], with recent DX of bilateral basial pneumonia, upon EMS arrival pt in low 80s on RA, placed on NRB with 94% on 10L. Temp 102.5, pt with +4 pitting edema to lower extremities, duoneb and Solumedrol 125mg  given by EMS. DNR at bedside. Using accerssory muscles at rest to breathe. EKG shows PACs, Sinus tach.

## 2013-07-27 ENCOUNTER — Encounter (HOSPITAL_COMMUNITY): Payer: Self-pay | Admitting: Internal Medicine

## 2013-07-27 DIAGNOSIS — J9600 Acute respiratory failure, unspecified whether with hypoxia or hypercapnia: Secondary | ICD-10-CM

## 2013-07-27 DIAGNOSIS — J449 Chronic obstructive pulmonary disease, unspecified: Secondary | ICD-10-CM

## 2013-07-27 DIAGNOSIS — E1149 Type 2 diabetes mellitus with other diabetic neurological complication: Secondary | ICD-10-CM

## 2013-07-27 DIAGNOSIS — E1142 Type 2 diabetes mellitus with diabetic polyneuropathy: Secondary | ICD-10-CM

## 2013-07-27 DIAGNOSIS — A419 Sepsis, unspecified organism: Secondary | ICD-10-CM

## 2013-07-27 DIAGNOSIS — N179 Acute kidney failure, unspecified: Secondary | ICD-10-CM

## 2013-07-27 DIAGNOSIS — J9620 Acute and chronic respiratory failure, unspecified whether with hypoxia or hypercapnia: Secondary | ICD-10-CM

## 2013-07-27 DIAGNOSIS — J189 Pneumonia, unspecified organism: Secondary | ICD-10-CM

## 2013-07-27 DIAGNOSIS — J441 Chronic obstructive pulmonary disease with (acute) exacerbation: Secondary | ICD-10-CM

## 2013-07-27 HISTORY — DX: Sepsis, unspecified organism: A41.9

## 2013-07-27 HISTORY — DX: Acute kidney failure, unspecified: N17.9

## 2013-07-27 LAB — BLOOD GAS, ARTERIAL
Acid-base deficit: 0.6 mmol/L (ref 0.0–2.0)
Bicarbonate: 25 mEq/L — ABNORMAL HIGH (ref 20.0–24.0)
FIO2: 0.28 %
Patient temperature: 98.6
TCO2: 26.6 mmol/L (ref 0–100)
pCO2 arterial: 52.3 mmHg — ABNORMAL HIGH (ref 35.0–45.0)
pH, Arterial: 7.3 — ABNORMAL LOW (ref 7.350–7.450)

## 2013-07-27 LAB — CBC WITH DIFFERENTIAL/PLATELET
Basophils Absolute: 0 10*3/uL (ref 0.0–0.1)
Basophils Relative: 0 % (ref 0–1)
Eosinophils Absolute: 0 10*3/uL (ref 0.0–0.7)
Eosinophils Relative: 0 % (ref 0–5)
Lymphs Abs: 0.4 10*3/uL — ABNORMAL LOW (ref 0.7–4.0)
MCH: 26.1 pg (ref 26.0–34.0)
MCHC: 30.3 g/dL (ref 30.0–36.0)
MCV: 86.1 fL (ref 78.0–100.0)
Monocytes Relative: 2 % — ABNORMAL LOW (ref 3–12)
Neutro Abs: 22.3 10*3/uL — ABNORMAL HIGH (ref 1.7–7.7)
Platelets: 234 10*3/uL (ref 150–400)
RBC: 3.8 MIL/uL — ABNORMAL LOW (ref 4.22–5.81)
RDW: 18.3 % — ABNORMAL HIGH (ref 11.5–15.5)
WBC: 23.1 10*3/uL — ABNORMAL HIGH (ref 4.0–10.5)

## 2013-07-27 LAB — INFLUENZA PANEL BY PCR (TYPE A & B): Influenza A By PCR: NEGATIVE

## 2013-07-27 LAB — URINALYSIS, ROUTINE W REFLEX MICROSCOPIC
Bilirubin Urine: NEGATIVE
Glucose, UA: >1000 mg/dL — AB
Ketones, ur: NEGATIVE mg/dL
Protein, ur: NEGATIVE mg/dL
Urobilinogen, UA: 0.2 mg/dL (ref 0.0–1.0)
pH: 5.5 (ref 5.0–8.0)

## 2013-07-27 LAB — COMPREHENSIVE METABOLIC PANEL
ALT: 16 U/L (ref 0–53)
AST: 17 U/L (ref 0–37)
Albumin: 2.8 g/dL — ABNORMAL LOW (ref 3.5–5.2)
Alkaline Phosphatase: 121 U/L — ABNORMAL HIGH (ref 39–117)
BUN: 36 mg/dL — ABNORMAL HIGH (ref 6–23)
CO2: 24 mEq/L (ref 19–32)
Chloride: 98 mEq/L (ref 96–112)
Creatinine, Ser: 1.67 mg/dL — ABNORMAL HIGH (ref 0.50–1.35)
GFR calc non Af Amer: 41 mL/min — ABNORMAL LOW (ref >90)
Potassium: 4.3 mEq/L (ref 3.5–5.1)
Sodium: 137 mEq/L (ref 135–145)
Total Bilirubin: 0.4 mg/dL (ref 0.3–1.2)

## 2013-07-27 LAB — GLUCOSE, CAPILLARY: Glucose-Capillary: 216 mg/dL — ABNORMAL HIGH (ref 70–99)

## 2013-07-27 LAB — HEMOGLOBIN A1C
Hgb A1c MFr Bld: 7.2 % — ABNORMAL HIGH (ref <5.7)
Mean Plasma Glucose: 160 mg/dL — ABNORMAL HIGH (ref <117)

## 2013-07-27 LAB — TROPONIN I: Troponin I: <0.3 ng/mL (ref <0.30)

## 2013-07-27 LAB — CREATININE, URINE, RANDOM: Creatinine, Urine: 60.15 mg/dL

## 2013-07-27 LAB — URINE MICROSCOPIC-ADD ON

## 2013-07-27 LAB — SODIUM, URINE, RANDOM: Sodium, Ur: 12 mEq/L

## 2013-07-27 LAB — TSH: TSH: 0.952 u[IU]/mL (ref 0.350–4.500)

## 2013-07-27 LAB — PRO B NATRIURETIC PEPTIDE: Pro B Natriuretic peptide (BNP): 1093 pg/mL — ABNORMAL HIGH (ref 0–125)

## 2013-07-27 MED ORDER — AMLODIPINE BESYLATE 10 MG PO TABS
10.0000 mg | ORAL_TABLET | Freq: Every day | ORAL | Status: DC
  Administered 2013-07-27 – 2013-07-29 (×3): 10 mg via ORAL
  Filled 2013-07-27 (×3): qty 1

## 2013-07-27 MED ORDER — SODIUM CHLORIDE 0.9 % IJ SOLN: 3.0000 mL | Freq: Two times a day (BID) | INTRAMUSCULAR | Status: DC

## 2013-07-27 MED ORDER — BUDESONIDE 0.25 MG/2ML IN SUSP
0.2500 mg | Freq: Two times a day (BID) | RESPIRATORY_TRACT | Status: DC
  Filled 2013-07-27 (×3): qty 2

## 2013-07-27 MED ORDER — BUDESONIDE-FORMOTEROL FUMARATE 160-4.5 MCG/ACT IN AERO
2.0000 | INHALATION_SPRAY | Freq: Two times a day (BID) | RESPIRATORY_TRACT | Status: DC
  Administered 2013-07-27 – 2013-07-29 (×5): 2 via RESPIRATORY_TRACT
  Filled 2013-07-27: qty 6

## 2013-07-27 MED ORDER — POTASSIUM CHLORIDE CRYS ER 20 MEQ PO TBCR
20.0000 meq | EXTENDED_RELEASE_TABLET | Freq: Every day | ORAL | Status: DC
  Administered 2013-07-27 – 2013-07-29 (×3): 20 meq via ORAL
  Filled 2013-07-27 (×3): qty 1

## 2013-07-27 MED ORDER — CITALOPRAM HYDROBROMIDE 40 MG PO TABS
40.0000 mg | ORAL_TABLET | Freq: Every day | ORAL | Status: DC
  Administered 2013-07-27 – 2013-07-29 (×3): 40 mg via ORAL
  Filled 2013-07-27 (×3): qty 1

## 2013-07-27 MED ORDER — METHYLPREDNISOLONE SODIUM SUCC 40 MG IJ SOLR
40.0000 mg | Freq: Two times a day (BID) | INTRAMUSCULAR | Status: DC
  Administered 2013-07-27 – 2013-07-29 (×5): 40 mg via INTRAVENOUS
  Filled 2013-07-27 (×9): qty 1

## 2013-07-27 MED ORDER — ONDANSETRON HCL 4 MG/2ML IJ SOLN: 4.0000 mg | Freq: Four times a day (QID) | INTRAMUSCULAR | Status: DC | PRN

## 2013-07-27 MED ORDER — METHYLPREDNISOLONE SODIUM SUCC 40 MG IJ SOLR
40.0000 mg | Freq: Every day | INTRAMUSCULAR | Status: DC
  Filled 2013-07-27: qty 1

## 2013-07-27 MED ORDER — VANCOMYCIN HCL 10 G IV SOLR
1250.0000 mg | Freq: Two times a day (BID) | INTRAVENOUS | Status: DC
  Administered 2013-07-27: 1250 mg via INTRAVENOUS
  Filled 2013-07-27 (×2): qty 1250

## 2013-07-27 MED ORDER — ACETAMINOPHEN 650 MG RE SUPP: 650.0000 mg | Freq: Four times a day (QID) | RECTAL | Status: DC | PRN

## 2013-07-27 MED ORDER — TIOTROPIUM BROMIDE MONOHYDRATE 18 MCG IN CAPS
18.0000 ug | ORAL_CAPSULE | Freq: Every day | RESPIRATORY_TRACT | Status: DC
  Administered 2013-07-27 – 2013-07-29 (×3): 18 ug via RESPIRATORY_TRACT
  Filled 2013-07-27: qty 5

## 2013-07-27 MED ORDER — INSULIN ASPART 100 UNIT/ML ~~LOC~~ SOLN
6.0000 [IU] | Freq: Three times a day (TID) | SUBCUTANEOUS | Status: DC
  Administered 2013-07-28 (×3): 6 [IU] via SUBCUTANEOUS

## 2013-07-27 MED ORDER — SODIUM CHLORIDE 0.9 % IV SOLN
INTRAVENOUS | Status: AC
  Administered 2013-07-27: 09:00:00 via INTRAVENOUS

## 2013-07-27 MED ORDER — INSULIN ASPART 100 UNIT/ML ~~LOC~~ SOLN: 0.0000 [IU] | Freq: Three times a day (TID) | SUBCUTANEOUS | Status: DC

## 2013-07-27 MED ORDER — GABAPENTIN 100 MG PO CAPS
100.0000 mg | ORAL_CAPSULE | Freq: Two times a day (BID) | ORAL | Status: DC
  Administered 2013-07-27 – 2013-07-29 (×5): 100 mg via ORAL
  Filled 2013-07-27 (×6): qty 1

## 2013-07-27 MED ORDER — SODIUM CHLORIDE 0.9 % IJ SOLN
3.0000 mL | Freq: Two times a day (BID) | INTRAMUSCULAR | Status: DC
  Administered 2013-07-27: 3 mL via INTRAVENOUS

## 2013-07-27 MED ORDER — ACETAMINOPHEN 325 MG PO TABS
650.0000 mg | ORAL_TABLET | Freq: Four times a day (QID) | ORAL | Status: DC | PRN
  Administered 2013-07-27 – 2013-07-28 (×2): 650 mg via ORAL
  Filled 2013-07-27 (×2): qty 2

## 2013-07-27 MED ORDER — INSULIN DETEMIR 100 UNIT/ML ~~LOC~~ SOLN
40.0000 [IU] | Freq: Every day | SUBCUTANEOUS | Status: DC
  Administered 2013-07-27 – 2013-07-29 (×3): 40 [IU] via SUBCUTANEOUS
  Filled 2013-07-27 (×4): qty 0.4

## 2013-07-27 MED ORDER — PRIMIDONE 250 MG PO TABS
250.0000 mg | ORAL_TABLET | Freq: Two times a day (BID) | ORAL | Status: DC
  Administered 2013-07-27 – 2013-07-29 (×5): 250 mg via ORAL
  Filled 2013-07-27 (×6): qty 1

## 2013-07-27 MED ORDER — TRAMADOL HCL 50 MG PO TABS
50.0000 mg | ORAL_TABLET | Freq: Four times a day (QID) | ORAL | Status: DC | PRN
  Administered 2013-07-28 – 2013-07-29 (×4): 50 mg via ORAL
  Filled 2013-07-27 (×4): qty 1

## 2013-07-27 MED ORDER — ALBUTEROL SULFATE (5 MG/ML) 0.5% IN NEBU
2.5000 mg | INHALATION_SOLUTION | RESPIRATORY_TRACT | Status: DC | PRN
  Administered 2013-07-27 – 2013-07-29 (×2): 2.5 mg via RESPIRATORY_TRACT
  Filled 2013-07-27 (×2): qty 0.5

## 2013-07-27 MED ORDER — INSULIN ASPART 100 UNIT/ML ~~LOC~~ SOLN
0.0000 [IU] | Freq: Three times a day (TID) | SUBCUTANEOUS | Status: DC
  Administered 2013-07-27: 13:00:00 20 [IU] via SUBCUTANEOUS
  Administered 2013-07-27: 11 [IU] via SUBCUTANEOUS
  Administered 2013-07-27: 20 [IU] via SUBCUTANEOUS
  Administered 2013-07-28: 08:00:00 11 [IU] via SUBCUTANEOUS
  Administered 2013-07-28: 18:00:00 7 [IU] via SUBCUTANEOUS
  Administered 2013-07-28: 13:00:00 4 [IU] via SUBCUTANEOUS

## 2013-07-27 MED ORDER — OLANZAPINE 10 MG PO TABS
10.0000 mg | ORAL_TABLET | Freq: Every day | ORAL | Status: DC
  Administered 2013-07-27 – 2013-07-28 (×2): 10 mg via ORAL
  Filled 2013-07-27 (×3): qty 1

## 2013-07-27 MED ORDER — FERROUS SULFATE 325 (65 FE) MG PO TABS
325.0000 mg | ORAL_TABLET | Freq: Three times a day (TID) | ORAL | Status: DC
  Administered 2013-07-27 – 2013-07-28 (×5): 325 mg via ORAL
  Filled 2013-07-27 (×7): qty 1

## 2013-07-27 MED ORDER — ALLOPURINOL 300 MG PO TABS
300.0000 mg | ORAL_TABLET | Freq: Two times a day (BID) | ORAL | Status: DC
  Administered 2013-07-27 – 2013-07-29 (×5): 300 mg via ORAL
  Filled 2013-07-27 (×6): qty 1

## 2013-07-27 MED ORDER — CLONAZEPAM 1 MG PO TABS
2.0000 mg | ORAL_TABLET | Freq: Three times a day (TID) | ORAL | Status: DC | PRN
  Administered 2013-07-29 (×2): 2 mg via ORAL
  Filled 2013-07-27 (×2): qty 2

## 2013-07-27 MED ORDER — ZOLPIDEM TARTRATE 5 MG PO TABS
5.0000 mg | ORAL_TABLET | Freq: Every evening | ORAL | Status: DC | PRN
  Administered 2013-07-27 – 2013-07-28 (×2): 5 mg via ORAL
  Filled 2013-07-27 (×2): qty 1

## 2013-07-27 MED ORDER — INSULIN ASPART 100 UNIT/ML ~~LOC~~ SOLN
0.0000 [IU] | Freq: Every day | SUBCUTANEOUS | Status: DC
  Administered 2013-07-27: 2 [IU] via SUBCUTANEOUS

## 2013-07-27 MED ORDER — PANTOPRAZOLE SODIUM 40 MG PO TBEC
40.0000 mg | DELAYED_RELEASE_TABLET | Freq: Two times a day (BID) | ORAL | Status: DC
  Administered 2013-07-27 – 2013-07-29 (×5): 40 mg via ORAL
  Filled 2013-07-27 (×6): qty 1

## 2013-07-27 MED ORDER — VANCOMYCIN HCL 10 G IV SOLR
1750.0000 mg | INTRAVENOUS | Status: DC
  Administered 2013-07-28: 09:00:00 1750 mg via INTRAVENOUS
  Filled 2013-07-27: qty 1750

## 2013-07-27 MED ORDER — ONDANSETRON HCL 4 MG PO TABS: 4.0000 mg | ORAL_TABLET | Freq: Four times a day (QID) | ORAL | Status: DC | PRN

## 2013-07-27 MED ORDER — TAMSULOSIN HCL 0.4 MG PO CAPS
0.4000 mg | ORAL_CAPSULE | Freq: Every day | ORAL | Status: DC
  Administered 2013-07-27 – 2013-07-29 (×3): 0.4 mg via ORAL
  Filled 2013-07-27 (×3): qty 1

## 2013-07-27 MED ORDER — FUROSEMIDE 80 MG PO TABS
80.0000 mg | ORAL_TABLET | Freq: Every day | ORAL | Status: DC
  Filled 2013-07-27: qty 1

## 2013-07-27 MED ORDER — DEXTROSE 5 % IV SOLN
1.0000 g | Freq: Three times a day (TID) | INTRAVENOUS | Status: DC
  Administered 2013-07-27 – 2013-07-28 (×4): 1 g via INTRAVENOUS
  Filled 2013-07-27 (×6): qty 1

## 2013-07-27 MED ORDER — ALBUTEROL SULFATE (5 MG/ML) 0.5% IN NEBU
2.5000 mg | INHALATION_SOLUTION | Freq: Four times a day (QID) | RESPIRATORY_TRACT | Status: DC
  Administered 2013-07-28: 2.5 mg via RESPIRATORY_TRACT
  Filled 2013-07-27: qty 0.5

## 2013-07-27 NOTE — ED Provider Notes (Signed)
Medical screening examination/treatment/procedure(s) were conducted as a shared visit with resident physician and myself.  I personally evaluated the patient during the encounter.   EKG Interpretation    Date/Time:  Wednesday July 26 2013 19:54:12 EST Ventricular Rate:  133 PR Interval:  100 QRS Duration: 118 QT Interval:  380 QTC Calculation: 565 R Axis:   -151 Text Interpretation:  Sinus tachycardia with irregular rate Baseline wander in lead(s) V2 No significant change since last tracing Confirmed by Igor Bishop  MD, Naylee Frankowski ([id number]) on 07/27/2013 12:34:38 AM           I interviewed and examined the patient. Bilateral diffuse wheezing, pt is tachypneic. Cardiac exam wnl. Abdomen soft. CXR concerning for pna. Will place on bipap as pt is DNR/DNI and confirmed this to me. He is ok w/ getting bipap. Will plan for admission to stepdown unit.   CRITICAL CARE Performed by: Purvis Sheffield, S Total critical care time: 30 min Critical care time was exclusive of separately billable procedures and treating other patients. Critical care was necessary to treat or prevent imminent or life-threatening deterioration. Critical care was time spent personally by me on the following activities: development of treatment plan with patient and/or surrogate as well as nursing, discussions with consultants, evaluation of patient's response to treatment, examination of patient, obtaining history from patient or surrogate, ordering and performing treatments and interventions, ordering and review of laboratory studies, ordering and review of radiographic studies, pulse oximetry and re-evaluation of patient's condition.   Clinical Impression 1. Pneumonia   2. Acute respiratory failure   3. COPD exacerbation   4. DM type 2 with diabetic peripheral neuropathy   5. Acute and chronic respiratory failure   6. AKI (acute kidney injury)   7. COPD (chronic obstructive pulmonary disease)       Junius Argyle, MD 07/27/13 1435

## 2013-07-27 NOTE — Progress Notes (Signed)
[facility] PROGRESS NOTE Interim History: 67 y.o. male with known history of COPD, hypertension, diabetes, CHF OSA was brought from the nursing home after patient was complaining of shortness of breath. Patient states he's been short of breath for last few days with productive cough. In the ER patient was found to be acutely short of breath and is initially placed on BiPAP and patient had refused and removed it. Later patient was placed on 100% nonrebreather and patient's be improved with nebulizer and steroids and patient has been started on antibiotics for pneumonia as patient also was found to be febrile. Patient at this time is admitted for further management. Patient denies any nausea vomiting abdominal pain chest pain or diarrhea.    Assessment/Plan: Acute and chronic respiratory failure multifactorial due to   COPD exacerbation, OSA,   Pneumonia and possible OHS/Sepsis: - cont inhalers, empiric antibiotics and steroids. - off non rebreather, now on nasal canula 2L O2. - transfer to telemetry floor.  AKI (acute kidney injury) - Urinary sodium pending. - start IV fluids, as he appears dry.   DM type 2 with diabetic peripheral neuropathy - start lantus plus SSI. - Check HbgA1c.    Code Status: full Family Communication: none  Disposition Plan: inpatient   Consultants:  none  Procedures:  CXR  Antibiotics:  vanc cefepime  HPI/Subjective: No complains.  Objective: Filed Vitals:   07/27/13 0100 07/27/13 0149 07/27/13 0150 07/27/13 0402  BP: 124/62  108/56 129/63  Pulse:  87 85   Temp: 99 F (37.2 C)   98.5 F (36.9 C)  TempSrc: Oral   Oral  Resp:  14 15   Height:      Weight:      SpO2:  95% 96% 96%    Intake/Output Summary (Last 24 hours) at 07/27/13 1610 Last data filed at 07/27/13 0650  Gross per 24 hour  Intake      0 ml  Output    900 ml  Net   -900 ml   Filed Weights   07/27/13 0025  Weight: 125.4 kg (276 lb 7.3 oz)     Exam:  General: Alert, awake, oriented x2, in no acute distress.  HEENT: No bruits, no goiter.  Heart: Regular rate and rhythm, without murmurs, rubs, gallops.  Lungs: poor air movement, CTA B/L Abdomen: Soft, nontender, nondistended, positive bowel sounds.  Neuro: Grossly intact, nonfocal.   Data Reviewed: Basic Metabolic Panel:  Recent Labs Lab 07/26/13 2002 07/27/13 0430  NA 137 137  K 3.8 4.3  CL 96 98  CO2 25 24  GLUCOSE 226* 435*  BUN 32* 36*  CREATININE 1.54* 1.67*  CALCIUM 8.8 9.1   Liver Function Tests:  Recent Labs Lab 07/26/13 2002 07/27/13 0430  AST 19 17  ALT 22 16  ALKPHOS 131* 121*  BILITOT 0.7 0.4  PROT 6.7 6.4  ALBUMIN 3.2* 2.8*   No results found for this basename: LIPASE, AMYLASE,  in the last 168 hours No results found for this basename: AMMONIA,  in the last 168 hours CBC:  Recent Labs Lab 07/26/13 2000 07/27/13 0430  WBC 26.8* 23.1*  NEUTROABS  --  22.3*  HGB 11.3* 9.9*  HCT 36.4* 32.7*  MCV 84.5 86.1  PLT 268 234   Cardiac Enzymes:  Recent Labs Lab 07/27/13 0430  TROPONINI <0.30   BNP (last 3 results)  Recent Labs  07/12/13 0315 07/26/13 2000 07/27/13 0430  PROBNP 102.7 695.5* 1093.0*   CBG: No results found for  this basename: GLUCAP,  in the last 168 hours  Recent Results (from the past 240 hour(s))  MRSA PCR SCREENING     Status: None   Collection Time    07/27/13 12:50 AM      Result Value Range Status   MRSA by PCR NEGATIVE  NEGATIVE Final   Comment:            The GeneXpert MRSA Assay (FDA     approved for NASAL specimens     only), is one component of a     comprehensive MRSA colonization     surveillance program. It is not     intended to diagnose MRSA     infection nor to guide or     monitor treatment for     MRSA infections.     Studies: Dg Chest Port 1 View  (if Code Sepsis Called)  07/26/2013   CLINICAL DATA:  Shortness of Breath  EXAM: PORTABLE CHEST - 1 VIEW  COMPARISON:   07/12/2013  FINDINGS: Borderline cardiomegaly. Mild interstitial prominence bilaterally. No convincing pulmonary edema. Poor inspiration with streaky bilateral basilar atelectasis or infiltrate.  IMPRESSION: Poor inspiration with streaky bilateral basilar atelectasis or infiltrate. Mild interstitial prominence bilaterally without convincing pulmonary edema.   Electronically Signed   By: Natasha Mead M.D.   On: 07/26/2013 20:24    Scheduled Meds: . ipratropium  0.5 mg Nebulization Q4H   And  . albuterol  2.5 mg Nebulization Q4H  . allopurinol  300 mg Oral BID  . amLODipine  10 mg Oral Daily  . budesonide (PULMICORT) nebulizer solution  0.25 mg Nebulization BID  . ceFEPime (MAXIPIME) IV  1 g Intravenous Q8H  . citalopram  40 mg Oral Daily  . ferrous sulfate  325 mg Oral TID WC  . gabapentin  100 mg Oral BID  . insulin aspart  0-9 Units Subcutaneous TID WC  . methylPREDNISolone (SOLU-MEDROL) injection  40 mg Intravenous Daily  . OLANZapine  10 mg Oral Daily  . pantoprazole  40 mg Oral BID  . potassium chloride SA  20 mEq Oral Daily  . primidone  250 mg Oral BID  . sodium chloride  3 mL Intravenous Q12H  . sodium chloride  3 mL Intravenous Q12H  . tamsulosin  0.4 mg Oral Daily  . vancomycin  1,250 mg Intravenous Q12H   Continuous Infusions:    Marinda Elk  [facility] Pager [phone]. If 8PM-8AM, please contact night-coverage at [email or web address], password [facility] 07/27/2013, 7:22 AM  LOS: 1 day

## 2013-07-27 NOTE — Progress Notes (Signed)
ANTIBIOTIC CONSULT NOTE - INITIAL  Pharmacy Consult for vancomycin and cefepime Indication: rule out pneumonia  Allergies  Allergen Reactions  . Amoxil [Amoxicillin]     Per MAR  . Penicillins Other (See Comments)    unknown    Patient Measurements: Height: 5\' 11"  (180.3 cm) Weight: 276 lb 7.3 oz (125.4 kg) IBW/kg (Calculated) : 75.3  Vital Signs: Temp: 99 F (37.2 C) (11/27 0100) Temp src: Oral (11/27 0100) BP: 108/56 mmHg (11/27 0150) Pulse Rate: 85 (11/27 0150)  Labs:  Recent Labs  07/26/13 2000 07/26/13 2002  WBC 26.8*  --   HGB 11.3*  --   PLT 268  --   CREATININE  --  1.54*   Estimated Creatinine Clearance: 64.5 ml/min (by C-G formula based on Cr of 1.54).   Microbiology: No results found for this or any previous visit (from the past 720 hour(s)).  Medical History: Past Medical History  Diagnosis Date  . COPD (chronic obstructive pulmonary disease)   . Diabetes mellitus without complication   . Hyperlipidemia   . Depression   . CHF (congestive heart failure)   . Gout   . OSA (obstructive sleep apnea)     Medications:  Prescriptions prior to admission  Medication Sig Dispense Refill  . acetaminophen (TYLENOL) 325 MG tablet Take 650 mg by mouth every 4 (four) hours as needed for mild pain.      Marland Kitchen albuterol (PROVENTIL HFA;VENTOLIN HFA) 108 (90 BASE) MCG/ACT inhaler Inhale 1 puff into the lungs every 6 (six) hours as needed for wheezing or shortness of breath.      Marland Kitchen albuterol (PROVENTIL) (2.5 MG/3ML) 0.083% nebulizer solution Take 2.5 mg by nebulization every 6 (six) hours as needed for wheezing or shortness of breath.      . allopurinol (ZYLOPRIM) 300 MG tablet Take 300 mg by mouth 2 (two) times daily.      Marland Kitchen amLODipine (NORVASC) 10 MG tablet Take 10 mg by mouth daily.      . budesonide-formoterol (SYMBICORT) 160-4.5 MCG/ACT inhaler Inhale 2 puffs into the lungs 2 (two) times daily.      . citalopram (CELEXA) 40 MG tablet Take 40 mg by mouth daily.       . clonazePAM (KLONOPIN) 2 MG tablet Take 2 mg by mouth 3 (three) times daily as needed for anxiety.      . ferrous sulfate 325 (65 FE) MG tablet Take 325 mg by mouth 3 (three) times daily with meals.      . furosemide (LASIX) 80 MG tablet Take 80 mg by mouth daily.      Marland Kitchen gabapentin (NEURONTIN) 100 MG capsule Take 100 mg by mouth 2 (two) times daily.      . insulin lispro (HUMALOG) 100 UNIT/ML injection Inject 2-10 Units into the skin 4 (four) times daily -  before meals and at bedtime. Sliding scale      . OLANZapine (ZYPREXA) 10 MG tablet Take 10 mg by mouth daily.      . pantoprazole (PROTONIX) 40 MG tablet Take 40 mg by mouth 2 (two) times daily.      . potassium chloride SA (K-DUR,KLOR-CON) 20 MEQ tablet Take 20 mEq by mouth daily.      . predniSONE (DELTASONE) 10 MG tablet Take 10 mg by mouth daily with breakfast.      . predniSONE (DELTASONE) 10 MG tablet Take 2 tablets (20 mg total) by mouth 2 (two) times daily.  20 tablet  0  . primidone (MYSOLINE)  250 MG tablet Take 250 mg by mouth 2 (two) times daily.      . sitaGLIPtin-metformin (JANUMET) 50-500 MG per tablet Take 1 tablet by mouth 2 (two) times daily with a meal.      . tamsulosin (FLOMAX) 0.4 MG CAPS capsule Take 0.4 mg by mouth daily.      Marland Kitchen tiotropium (SPIRIVA) 18 MCG inhalation capsule Place 18 mcg into inhaler and inhale daily.      . traMADol (ULTRAM) 50 MG tablet Take 1 tablet by mouth every 12 hours as needed for pain.  60 tablet  0  . zolpidem (AMBIEN) 10 MG tablet Take 10 mg by mouth at bedtime as needed for sleep.       Scheduled:  . ipratropium  0.5 mg Nebulization Q4H   And  . albuterol  2.5 mg Nebulization Q4H  . allopurinol  300 mg Oral BID  . amLODipine  10 mg Oral Daily  . budesonide (PULMICORT) nebulizer solution  0.25 mg Nebulization BID  . ceFEPime (MAXIPIME) IV  1 g Intravenous Q8H  . citalopram  40 mg Oral Daily  . ferrous sulfate  325 mg Oral TID WC  . furosemide  80 mg Oral Daily  . gabapentin  100  mg Oral BID  . insulin aspart  0-9 Units Subcutaneous TID WC  . methylPREDNISolone (SOLU-MEDROL) injection  40 mg Intravenous Daily  . OLANZapine  10 mg Oral Daily  . pantoprazole  40 mg Oral BID  . potassium chloride SA  20 mEq Oral Daily  . primidone  250 mg Oral BID  . sodium chloride  3 mL Intravenous Q12H  . sodium chloride  3 mL Intravenous Q12H  . tamsulosin  0.4 mg Oral Daily  . vancomycin  1,250 mg Intravenous Q12H    Assessment: 67yo male presents from NH w/ recent Dx of PNA, now with worsening SOB and WOB, to begin IV ABX for PNA and COPD exacerbation.  Goal of Therapy:  Vancomycin trough level 15-20 mcg/ml  Plan:  Rec'd vancomycin 2500mg  IV and cefepime 2g in ED; will continue with vancomycin 1250mg  IV Q12H and cefepime 1g IV Q8H and monitor CBC, Cx, levels prn.  Vernard Gambles, PharmD, BCPS  07/27/2013,2:51 AM

## 2013-07-27 NOTE — Progress Notes (Signed)
NURSING PROGRESS NOTE  Bill [name] [id number] Transfer Data: 07/27/2013 1:43 PM Attending Provider: Marinda Elk, MD WUJ:[id number], [facility], MD Code Status: dnr  Bill [name] is a 67 y.o. male patient transferred from 2C -No acute distress noted.  -No complaints of shortness of breath.  -No complaints of chest pain.   Cardiac Monitoring: Box # 18 in place. Cardiac monitor yields:BBB.  Blood pressure 111/50, pulse 81, temperature 97.7 F (36.5 C), temperature source Oral, resp. rate 24, height 5\' 11"  (1.803 m), weight 125.4 kg (276 lb 7.3 oz), SpO2 98.00%.   IV Fluids:  IV in place, occlusive dsg intact without redness, IV cath hand right, condition patent and no redness .   Allergies:  Amoxil and Penicillins  Past Medical History:   has a past medical history of COPD (chronic obstructive pulmonary disease); Diabetes mellitus without complication; Hyperlipidemia; Depression; CHF (congestive heart failure); Gout; and OSA (obstructive sleep apnea).  Past Surgical History:   has no past surgical history on file.  Social History:   reports that he has quit smoking. He does not have any smokeless tobacco history on file. He reports that he does not drink alcohol or use illicit drugs.  Skin: warm dry intact   Patient/Family orientated to room. Information packet given to patient/family. Admission inpatient armband information verified with patient/family to include name and date of birth and placed on patient arm. Side rails up x 2, fall assessment and education completed with patient/family. Patient/family able to verbalize understanding of risk associated with falls and verbalized understanding to call for assistance before getting out of bed. Call light within reach. Patient/family able to voice and demonstrate understanding of unit orientation instructions.    Will continue to evaluate and treat per MD orders.

## 2013-07-27 NOTE — H&P (Addendum)
[facility] History and Physical  KEYSHAUN EXLEY ZOX:[id number] DOB: July 04, 1946 DOA: 07/26/2013  Referring physician: ER physician. PCP: Oneal Grout, MD   Chief Complaint: Shortness of breath.  HPI: Bill [name] is a 67 y.o. male with known history of COPD, hypertension, diabetes, CHF OSA was brought from the nursing home after patient was complaining of shortness of breath. Patient states he's been short of breath for last few days with productive cough. In the ER patient was found to be acutely short of breath and is initially placed on BiPAP and patient had refused and removed it. Later patient was placed on 100% nonrebreather and patient's be improved with nebulizer and steroids and patient has been started on antibiotics for pneumonia as patient also was found to be febrile. Patient at this time is admitted for further management. Patient denies any nausea vomiting abdominal pain chest pain or diarrhea. Patient was admitted last month and another facility for syncope thought to be secondary to GI bleed and patient had refused EGD and colonoscopy at that time. Hemoglobin at that time was found to be around 7-7.5 as per the notes.   Review of Systems: As presented in the history of presenting illness, rest negative.  Past Medical History  Diagnosis Date  . COPD (chronic obstructive pulmonary disease)   . Diabetes mellitus without complication   . Hyperlipidemia   . Depression   . CHF (congestive heart failure)   . Gout   . OSA (obstructive sleep apnea)    History reviewed. No pertinent past surgical history. Social History:  reports that he has quit smoking. He does not have any smokeless tobacco history on file. He reports that he does not drink alcohol or use illicit drugs. Where does patient live nursing home. Can patient participate in ADLs? Yes.  Allergies  Allergen Reactions  . Amoxil [Amoxicillin]     Per MAR  . Penicillins Other (See Comments)    unknown     Family History:  Family History  Problem Relation Age of Onset  . Diabetes Mother   . Heart disease Mother       Prior to Admission medications   Medication Sig Start Date End Date Taking? Authorizing Provider  acetaminophen (TYLENOL) 325 MG tablet Take 650 mg by mouth every 4 (four) hours as needed for mild pain.   Yes Historical Provider, MD  albuterol (PROVENTIL HFA;VENTOLIN HFA) 108 (90 BASE) MCG/ACT inhaler Inhale 1 puff into the lungs every 6 (six) hours as needed for wheezing or shortness of breath.   Yes Historical Provider, MD  albuterol (PROVENTIL) (2.5 MG/3ML) 0.083% nebulizer solution Take 2.5 mg by nebulization every 6 (six) hours as needed for wheezing or shortness of breath.   Yes Historical Provider, MD  allopurinol (ZYLOPRIM) 300 MG tablet Take 300 mg by mouth 2 (two) times daily.   Yes Historical Provider, MD  amLODipine (NORVASC) 10 MG tablet Take 10 mg by mouth daily.   Yes Historical Provider, MD  budesonide-formoterol (SYMBICORT) 160-4.5 MCG/ACT inhaler Inhale 2 puffs into the lungs 2 (two) times daily.   Yes Historical Provider, MD  citalopram (CELEXA) 40 MG tablet Take 40 mg by mouth daily.   Yes Historical Provider, MD  clonazePAM (KLONOPIN) 2 MG tablet Take 2 mg by mouth 3 (three) times daily as needed for anxiety.   Yes Historical Provider, MD  ferrous sulfate 325 (65 FE) MG tablet Take 325 mg by mouth 3 (three) times daily with meals.   Yes Historical Provider,  MD  furosemide (LASIX) 80 MG tablet Take 80 mg by mouth daily.   Yes Historical Provider, MD  gabapentin (NEURONTIN) 100 MG capsule Take 100 mg by mouth 2 (two) times daily.   Yes Historical Provider, MD  insulin lispro (HUMALOG) 100 UNIT/ML injection Inject 2-10 Units into the skin 4 (four) times daily -  before meals and at bedtime. Sliding scale   Yes Historical Provider, MD  OLANZapine (ZYPREXA) 10 MG tablet Take 10 mg by mouth daily.   Yes Historical Provider, MD  pantoprazole (PROTONIX) 40 MG  tablet Take 40 mg by mouth 2 (two) times daily.   Yes Historical Provider, MD  potassium chloride SA (K-DUR,KLOR-CON) 20 MEQ tablet Take 20 mEq by mouth daily.   Yes Historical Provider, MD  predniSONE (DELTASONE) 10 MG tablet Take 10 mg by mouth daily with breakfast.   Yes Historical Provider, MD  predniSONE (DELTASONE) 10 MG tablet Take 2 tablets (20 mg total) by mouth 2 (two) times daily. 07/12/13  Yes Geoffery Lyons, MD  primidone (MYSOLINE) 250 MG tablet Take 250 mg by mouth 2 (two) times daily.   Yes Historical Provider, MD  sitaGLIPtin-metformin (JANUMET) 50-500 MG per tablet Take 1 tablet by mouth 2 (two) times daily with a meal.   Yes Historical Provider, MD  tamsulosin (FLOMAX) 0.4 MG CAPS capsule Take 0.4 mg by mouth daily.   Yes Historical Provider, MD  tiotropium (SPIRIVA) 18 MCG inhalation capsule Place 18 mcg into inhaler and inhale daily.   Yes Historical Provider, MD  traMADol (ULTRAM) 50 MG tablet Take 1 tablet by mouth every 12 hours as needed for pain. 06/19/13   Oneal Grout, MD  zolpidem (AMBIEN) 10 MG tablet Take 10 mg by mouth at bedtime as needed for sleep.    Historical Provider, MD    Physical Exam: Filed Vitals:   07/27/13 0025 07/27/13 0100 07/27/13 0149 07/27/13 0150  BP:  124/62  108/56  Pulse:   87 85  Temp:  99 F (37.2 C)    TempSrc:  Oral    Resp:   14 15  Height: 5\' 11"  (1.803 m)     Weight: 125.4 kg (276 lb 7.3 oz)     SpO2:   95% 96%     General:  Well-developed well-nourished.  Eyes: Anicteric no pallor.  ENT: No discharge from ears eyes nose mouth.  Neck: No mass felt. JVD not appreciated.  Cardiovascular: S1-S2 heard.  Respiratory: Bilateral expiratory wheeze heard no crepitations.  Abdomen: Soft nontender bowel sounds present.  Skin: No rash.  Musculoskeletal: No edema.  Psychiatric: Patient is sleeping at this time but arousable.  Neurologic: Patient is sleeping at this time but arousable and follows commands.  Labs on  Admission:  Basic Metabolic Panel:  Recent Labs Lab 07/26/13 2002  NA 137  K 3.8  CL 96  CO2 25  GLUCOSE 226*  BUN 32*  CREATININE 1.54*  CALCIUM 8.8   Liver Function Tests:  Recent Labs Lab 07/26/13 2002  AST 19  ALT 22  ALKPHOS 131*  BILITOT 0.7  PROT 6.7  ALBUMIN 3.2*   No results found for this basename: LIPASE, AMYLASE,  in the last 168 hours No results found for this basename: AMMONIA,  in the last 168 hours CBC:  Recent Labs Lab 07/26/13 2000  WBC 26.8*  HGB 11.3*  HCT 36.4*  MCV 84.5  PLT 268   Cardiac Enzymes: No results found for this basename: CKTOTAL, CKMB, CKMBINDEX, TROPONINI,  in  the last 168 hours  BNP (last 3 results)  Recent Labs  07/12/13 0315 07/26/13 2000  PROBNP 102.7 695.5*   CBG: No results found for this basename: GLUCAP,  in the last 168 hours  Radiological Exams on Admission: Dg Chest Port 1 View  (if Code Sepsis Called)  07/26/2013   CLINICAL DATA:  Shortness of Breath  EXAM: PORTABLE CHEST - 1 VIEW  COMPARISON:  07/12/2013  FINDINGS: Borderline cardiomegaly. Mild interstitial prominence bilaterally. No convincing pulmonary edema. Poor inspiration with streaky bilateral basilar atelectasis or infiltrate.  IMPRESSION: Poor inspiration with streaky bilateral basilar atelectasis or infiltrate. Mild interstitial prominence bilaterally without convincing pulmonary edema.   Electronically Signed   By: Natasha Mead M.D.   On: 07/26/2013 20:24     Assessment/Plan Principal Problem:   Acute and chronic respiratory failure Active Problems:   COPD (chronic obstructive pulmonary disease)   DM type 2 with diabetic peripheral neuropathy   Pneumonia   Acute respiratory failure   1. Acute respiratory failure - probably secondary to COPD exacerbation and pneumonia. Continue nebulizers steroids Pulmicort and antibiotics. Check influenza PCR. Check ABG. 2. CHF - Holding lasix due to renal failure. Closely follow intake output. EKG is  pending. 3. Renal failure - check UA. Closely follow intake output. Hold Lasix for now. Check urine creatinine and NA to calculate FeNa. 4. Leucocytosis - probably from #1. Follow cultures. 5. Diabetes mellitus type 2 - closely follow CBGs with sliding-scale coverage. 6. History of OSA per chart - patient does not allow to keep BiPAP. 7. History of gout. 8. History of anemia - follow CBC. As per the notes patient had refused EGD and colonoscopy in the previous admission at another facility.    Code Status: DO NOT RESUSCITATE.  Family Communication: None.  Disposition Plan: Admit to inpatient.    Tahlia Deamer N. [facility] Pager [phone].  If 7PM-7AM, please contact night-coverage [email or web address] Password [facility] 07/27/2013, 2:48 AM

## 2013-07-27 NOTE — Progress Notes (Signed)
ANTIBIOTIC CONSULT NOTE   Pharmacy Consult for vancomycin and cefepime Indication: rule out pneumonia  Allergies  Allergen Reactions  . Amoxil [Amoxicillin]     Per MAR  . Penicillins Other (See Comments)    unknown    Patient Measurements: Height: 5\' 11"  (180.3 cm) Weight: 276 lb 7.3 oz (125.4 kg) IBW/kg (Calculated) : 75.3  Vital Signs: Temp: 97.7 F (36.5 C) (11/27 0746) Temp src: Oral (11/27 0746) BP: 111/50 mmHg (11/27 1210) Pulse Rate: 81 (11/27 1100)  Labs:  Recent Labs  07/26/13 2000 07/26/13 2002 07/27/13 0430 07/27/13 0956  WBC 26.8*  --  23.1*  --   HGB 11.3*  --  9.9*  --   PLT 268  --  234  --   LABCREA  --   --   --  60.15  CREATININE  --  1.54* 1.67*  --    Estimated Creatinine Clearance: 59.4 ml/min (by C-G formula based on Cr of 1.67).   Microbiology: Recent Results (from the past 720 hour(s))  MRSA PCR SCREENING     Status: None   Collection Time    07/27/13 12:50 AM      Result Value Range Status   MRSA by PCR NEGATIVE  NEGATIVE Final   Comment:            The GeneXpert MRSA Assay (FDA     approved for NASAL specimens     only), is one component of a     comprehensive MRSA colonization     surveillance program. It is not     intended to diagnose MRSA     infection nor to guide or     monitor treatment for     MRSA infections.     Assessment: 67yo male presents from NH w/ recent Dx of PNA, now with worsening SOB and WOB, to begin IV ABX for PNA and COPD exacerbation.  Creatinine up to 1.67  Goal of Therapy:  Vancomycin trough level 15-20 mcg/ml  Plan:  Change vancomycin to 1750mg  IV q24 Continue cefepime 1g IV q8h  Celedonio Miyamoto, PharmD, BCPS Clinical Pharmacist Pager [phone]   07/27/2013,12:42 PM

## 2013-07-28 LAB — GLUCOSE, CAPILLARY
Glucose-Capillary: 263 mg/dL — ABNORMAL HIGH (ref 70–99)
Glucose-Capillary: 259 mg/dL — ABNORMAL HIGH (ref 70–99)
Glucose-Capillary: 180 mg/dL — ABNORMAL HIGH (ref 70–99)

## 2013-07-28 MED ORDER — LEVOFLOXACIN IN D5W 750 MG/150ML IV SOLN
750.0000 mg | INTRAVENOUS | Status: DC
  Administered 2013-07-28: 750 mg via INTRAVENOUS
  Filled 2013-07-28 (×2): qty 150

## 2013-07-28 MED ORDER — PREDNISONE 10 MG PO TABS: ORAL_TABLET | ORAL | Status: DC

## 2013-07-28 MED ORDER — INSULIN DETEMIR 100 UNIT/ML ~~LOC~~ SOLN: 40.0000 [IU] | Freq: Every day | SUBCUTANEOUS | Status: DC

## 2013-07-28 MED ORDER — LEVOFLOXACIN 750 MG PO TABS: 750.0000 mg | ORAL_TABLET | Freq: Every day | ORAL | Status: DC

## 2013-07-28 MED ORDER — ALBUTEROL SULFATE (5 MG/ML) 0.5% IN NEBU
2.5000 mg | INHALATION_SOLUTION | Freq: Four times a day (QID) | RESPIRATORY_TRACT | Status: DC
  Administered 2013-07-28 – 2013-07-29 (×6): 2.5 mg via RESPIRATORY_TRACT
  Filled 2013-07-28 (×6): qty 0.5

## 2013-07-28 NOTE — Clinical Social Work Note (Signed)
Per MD patient will be ready for DC back to [facility] on 07/29/13.  Roddie Mc, [place], [place], [phone]

## 2013-07-28 NOTE — Progress Notes (Signed)
[facility] PROGRESS NOTE Interim History: 67 y.o. male with known history of COPD, hypertension, diabetes, CHF OSA was brought from the nursing home after patient was complaining of shortness of breath. Patient states he's been short of breath for last few days with productive cough. In the ER patient was found to be acutely short of breath and is initially placed on BiPAP and patient had refused and removed it. Later patient was placed on 100% nonrebreather and patient's be improved with nebulizer and steroids and patient has been started on antibiotics for pneumonia as patient also was found to be febrile. Patient at this time is admitted for further management. Patient denies any nausea vomiting abdominal pain chest pain or diarrhea.    Assessment/Plan: Acute and chronic respiratory failure multifactorial due to   COPD exacerbation, OSA,   Pneumonia and possible OHS/Sepsis: - cont inhalers, empiric antibiotics and steroids. chane steroids and antibiotics to orals. - off non rebreather, now on nasal canula 2L O2. - transfer to telemetry floor. - ambulate pt and check O2.  AKI (acute kidney injury) - Urinary sodium 12 - KVO V fluids.   DM type 2 with diabetic peripheral neuropathy - start lantus plus SSI. - Check HbgA1c.    Code Status: full Family Communication: none  Disposition Plan: inpatient   Consultants:  none  Procedures:  CXR  Antibiotics:  vanc cefepime  HPI/Subjective: No complains.  Objective: Filed Vitals:   07/28/13 0500 07/28/13 0632 07/28/13 0856 07/28/13 1314  BP:  144/78    Pulse:  84    Temp:  97.7 F (36.5 C)    TempSrc:  Oral    Resp:  20    Height:      Weight: 118.48 kg (261 lb 3.2 oz) 120.067 kg (264 lb 11.2 oz)    SpO2:  95% 95% 96%    Intake/Output Summary (Last 24 hours) at 07/28/13 1338 Last data filed at 07/28/13 1208  Gross per 24 hour  Intake    720 ml  Output    950 ml  Net   -230 ml   Filed Weights   07/27/13  0025 07/28/13 0500 07/28/13 1610  Weight: 125.4 kg (276 lb 7.3 oz) 118.48 kg (261 lb 3.2 oz) 120.067 kg (264 lb 11.2 oz)    Exam:  General: Alert, awake, oriented x2, in no acute distress.  HEENT: No bruits, no goiter.  Heart: Regular rate and rhythm, without murmurs, rubs, gallops.  Lungs: poor air movement, CTA B/L Abdomen: Soft, nontender, nondistended, positive bowel sounds.  Neuro: Grossly intact, nonfocal.   Data Reviewed: Basic Metabolic Panel:  Recent Labs Lab 07/26/13 2002 07/27/13 0430  NA 137 137  K 3.8 4.3  CL 96 98  CO2 25 24  GLUCOSE 226* 435*  BUN 32* 36*  CREATININE 1.54* 1.67*  CALCIUM 8.8 9.1   Liver Function Tests:  Recent Labs Lab 07/26/13 2002 07/27/13 0430  AST 19 17  ALT 22 16  ALKPHOS 131* 121*  BILITOT 0.7 0.4  PROT 6.7 6.4  ALBUMIN 3.2* 2.8*   No results found for this basename: LIPASE, AMYLASE,  in the last 168 hours No results found for this basename: AMMONIA,  in the last 168 hours CBC:  Recent Labs Lab 07/26/13 2000 07/27/13 0430  WBC 26.8* 23.1*  NEUTROABS  --  22.3*  HGB 11.3* 9.9*  HCT 36.4* 32.7*  MCV 84.5 86.1  PLT 268 234   Cardiac Enzymes:  Recent Labs Lab 07/27/13 0430  TROPONINI <  0.30   BNP (last 3 results)  Recent Labs  07/12/13 0315 07/26/13 2000 07/27/13 0430  PROBNP 102.7 695.5* 1093.0*   CBG:  Recent Labs Lab 07/27/13 1149 07/27/13 1719 07/27/13 2127 07/28/13 0729 07/28/13 1206  GLUCAP 399* 259* 216* 263* 259*    Recent Results (from the past 240 hour(s))  CULTURE, BLOOD (ROUTINE X 2)     Status: None   Collection Time    07/26/13  8:07 PM      Result Value Range Status   Specimen Description BLOOD ARM LEFT   Final   Special Requests BOTTLES DRAWN AEROBIC ONLY 6CC   Final   Culture  Setup Time     Final   Value: 07/27/2013 11:23     Performed at [facility]   Culture     Final   Value:        BLOOD CULTURE RECEIVED NO GROWTH TO DATE CULTURE WILL BE HELD FOR 5 DAYS  BEFORE ISSUING A FINAL NEGATIVE REPORT     Performed at [facility]   Report Status PENDING   Incomplete  CULTURE, BLOOD (ROUTINE X 2)     Status: None   Collection Time    07/26/13  9:28 PM      Result Value Range Status   Specimen Description BLOOD ARM RIGHT   Final   Special Requests BOTTLES DRAWN AEROBIC AND ANAEROBIC 10CC   Final   Culture  Setup Time     Final   Value: 07/27/2013 11:23     Performed at [facility]   Culture     Final   Value:        BLOOD CULTURE RECEIVED NO GROWTH TO DATE CULTURE WILL BE HELD FOR 5 DAYS BEFORE ISSUING A FINAL NEGATIVE REPORT     Performed at [facility]   Report Status PENDING   Incomplete  MRSA PCR SCREENING     Status: None   Collection Time    07/27/13 12:50 AM      Result Value Range Status   MRSA by PCR NEGATIVE  NEGATIVE Final   Comment:            The GeneXpert MRSA Assay (FDA     approved for NASAL specimens     only), is one component of a     comprehensive MRSA colonization     surveillance program. It is not     intended to diagnose MRSA     infection nor to guide or     monitor treatment for     MRSA infections.     Studies: Dg Chest Port 1 View  (if Code Sepsis Called)  07/26/2013   CLINICAL DATA:  Shortness of Breath  EXAM: PORTABLE CHEST - 1 VIEW  COMPARISON:  07/12/2013  FINDINGS: Borderline cardiomegaly. Mild interstitial prominence bilaterally. No convincing pulmonary edema. Poor inspiration with streaky bilateral basilar atelectasis or infiltrate.  IMPRESSION: Poor inspiration with streaky bilateral basilar atelectasis or infiltrate. Mild interstitial prominence bilaterally without convincing pulmonary edema.   Electronically Signed   By: Natasha Mead M.D.   On: 07/26/2013 20:24    Scheduled Meds: . albuterol  2.5 mg Nebulization QID  . allopurinol  300 mg Oral BID  . amLODipine  10 mg Oral Daily  . budesonide-formoterol  2 puff Inhalation BID  . citalopram  40 mg Oral Daily  . ferrous  sulfate  325 mg Oral TID WC  . gabapentin  100 mg  Oral BID  . insulin aspart  0-20 Units Subcutaneous TID WC  . insulin aspart  0-5 Units Subcutaneous QHS  . insulin aspart  6 Units Subcutaneous TID WC  . insulin detemir  40 Units Subcutaneous Daily  . levofloxacin (LEVAQUIN) IV  750 mg Intravenous Q24H  . methylPREDNISolone (SOLU-MEDROL) injection  40 mg Intravenous Q12H  . OLANZapine  10 mg Oral Daily  . pantoprazole  40 mg Oral BID  . potassium chloride SA  20 mEq Oral Daily  . primidone  250 mg Oral BID  . tamsulosin  0.4 mg Oral Daily  . tiotropium  18 mcg Inhalation Daily   Continuous Infusions:    Marinda Elk  [facility] Pager [phone]. If 8PM-8AM, please contact night-coverage at [email or web address], password [facility] 07/28/2013, 1:38 PM  LOS: 2 days

## 2013-07-28 NOTE — Clinical Social Work Psychosocial (Signed)
Clinical Social Work Department BRIEF PSYCHOSOCIAL ASSESSMENT 07/28/2013  Patient:  Bill [name], Bill [name]     Account Number:  [id number]     Admit date:  07/26/2013  Clinical Social Worker:  Lavell Luster  Date/Time:  07/28/2013 02:49 PM  Referred by:  Physician  Date Referred:  07/28/2013 Referred for  SNF Placement   Other Referral:   Interview type:  Patient Other interview type:   Patient alert and oriented at time of assessment.    PSYCHOSOCIAL DATA Living Status:  FACILITY Admitted from facility:  [name] Level of care:  Skilled Nursing Facility Primary support name:  Purnell Shoemaker [phone] Primary support relationship to patient:  CHILD, ADULT Degree of support available:   Support is good.    CURRENT CONCERNS Current Concerns  Post-Acute Placement   Other Concerns:    SOCIAL WORK ASSESSMENT / PLAN CSW met with patient to confirm his plan to DC back to [facility] once medically ready. Patient is planning on returning to [facility]. Patient reports that he has a daughter who is his main support at this time.   Assessment/plan status:  Psychosocial Support/Ongoing Assessment of Needs Other assessment/ plan:   Complete FL2, Fax   Information/referral to community resources:   CSW contact information given to patient.    PATIENT'S/FAMILY'S RESPONSE TO PLAN OF CARE: Patient is planning to return to [facility] upon DC. Patient was pleasant and appreciative of CSW contact. CSW will facilitate DC back to facility.    Roddie Mc, [place], [employer], [phone]

## 2013-07-28 NOTE — Discharge Summary (Addendum)
Physician Discharge Summary  [name]:[id number] DOB: April 28, 1946 DOA: 07/26/2013  PCP: Bill Grout, MD  Admit date: 07/26/2013 Discharge date: 07/29/2013  Time spent: 45 minutes  Recommendations for Outpatient Follow-up:  1. Follow up with PCP in 2 weeks.  Discharge Diagnoses:  Principal Problem:   Acute and chronic respiratory failure Active Problems:   COPD (chronic obstructive pulmonary disease)   DM type 2 with diabetic peripheral neuropathy   Pneumonia   Acute respiratory failure   AKI (acute kidney injury)   Sepsis   Discharge Condition: stable  Diet recommendation: heart healthy diet  Filed Weights   07/28/13 0500 07/28/13 7829 07/29/13 0515  Weight: 118.48 kg (261 lb 3.2 oz) 120.067 kg (264 lb 11.2 oz) 120.022 kg (264 lb 9.6 oz)    History of present illness:  67 y.o. male with known history of COPD, hypertension, diabetes, CHF OSA was brought from the nursing home after patient was complaining of shortness of breath. Patient states he's been short of breath for last few days with productive cough. In the ER patient was found to be acutely short of breath and is initially placed on BiPAP and patient had refused and removed it. Later patient was placed on 100% nonrebreather and patient's be improved with nebulizer and steroids and patient has been started on antibiotics for pneumonia as patient also was found to be febrile. Patient at this time is admitted for further management. Patient denies any nausea vomiting abdominal pain chest pain or diarrhea   Hospital Course:  Acute and chronic respiratory failure multifactorial due to COPD exacerbation, OSA, Pneumonia and possible OHS/Sepsis:  - started empirically on IV steroids and antibiotics. - cont inhalers, empiric antibiotics and steroids change to orals, once his O2 requirement decrease. - off non rebreather, now on nasal canula 2L O2.  - ambulate pt and check O2.   AKI (acute kidney injury)  -  Urinary sodium 12  - resolved with IV fluids.  DM type 2 with diabetic peripheral neuropathy  - start lantus plus SSI.  - HbgA1c 7.2.    Procedures:  CXR  Consultations:  none  Discharge Exam: Filed Vitals:   07/29/13 0515  BP: 161/78  Pulse: 104  Temp: 99.1 F (37.3 C)  Resp: 22    General: A&O x3 Cardiovascular: RRR Respiratory: good air movement CTA B/L  Discharge Instructions      Discharge Orders   Future Orders Complete By Expires   Diet - low sodium heart healthy  As directed    Increase activity slowly  As directed        Medication List         acetaminophen 325 MG tablet  Commonly known as:  TYLENOL  Take 650 mg by mouth every 4 (four) hours as needed for mild pain.     albuterol 108 (90 BASE) MCG/ACT inhaler  Commonly known as:  PROVENTIL HFA;VENTOLIN HFA  Inhale 1 puff into the lungs every 6 (six) hours as needed for wheezing or shortness of breath.     albuterol (2.5 MG/3ML) 0.083% nebulizer solution  Commonly known as:  PROVENTIL  Take 2.5 mg by nebulization every 6 (six) hours as needed for wheezing or shortness of breath.     allopurinol 300 MG tablet  Commonly known as:  ZYLOPRIM  Take 300 mg by mouth 2 (two) times daily.     amLODipine 10 MG tablet  Commonly known as:  NORVASC  Take 10 mg by mouth daily.  budesonide-formoterol 160-4.5 MCG/ACT inhaler  Commonly known as:  SYMBICORT  Inhale 2 puffs into the lungs 2 (two) times daily.     citalopram 40 MG tablet  Commonly known as:  CELEXA  Take 40 mg by mouth daily.     clonazePAM 2 MG tablet  Commonly known as:  KLONOPIN  Take 2 mg by mouth 3 (three) times daily as needed for anxiety.     ferrous sulfate 325 (65 FE) MG tablet  Take 325 mg by mouth 3 (three) times daily with meals.     furosemide 80 MG tablet  Commonly known as:  LASIX  Take 80 mg by mouth daily.     gabapentin 100 MG capsule  Commonly known as:  NEURONTIN  Take 100 mg by mouth 2 (two) times  daily.     insulin detemir 100 UNIT/ML injection  Commonly known as:  LEVEMIR  Inject 0.4 mLs (40 Units total) into the skin daily.     insulin lispro 100 UNIT/ML injection  Commonly known as:  HUMALOG  Inject 2-10 Units into the skin 4 (four) times daily -  before meals and at bedtime. Sliding scale     levofloxacin 750 MG tablet  Commonly known as:  LEVAQUIN  Take 1 tablet (750 mg total) by mouth daily.     OLANZapine 10 MG tablet  Commonly known as:  ZYPREXA  Take 10 mg by mouth daily.     pantoprazole 40 MG tablet  Commonly known as:  PROTONIX  Take 40 mg by mouth 2 (two) times daily.     potassium chloride SA 20 MEQ tablet  Commonly known as:  K-DUR,KLOR-CON  Take 20 mEq by mouth daily.     predniSONE 10 MG tablet  Commonly known as:  DELTASONE  Takes 6 tablets for 2 days, then 5 tablets for 2 days, then 4 tablets for 2 days, then 3 tablets for 2 days, then 2 tabs for 2 days, then 1 tab for 2 days, and then stop.     predniSONE 10 MG tablet  Commonly known as:  DELTASONE  Take 10 mg by mouth daily with breakfast.     primidone 250 MG tablet  Commonly known as:  MYSOLINE  Take 250 mg by mouth 2 (two) times daily.     sitaGLIPtin-metformin 50-500 MG per tablet  Commonly known as:  JANUMET  Take 1 tablet by mouth 2 (two) times daily with a meal.     tamsulosin 0.4 MG Caps capsule  Commonly known as:  FLOMAX  Take 0.4 mg by mouth daily.     tiotropium 18 MCG inhalation capsule  Commonly known as:  SPIRIVA  Place 18 mcg into inhaler and inhale daily.     traMADol 50 MG tablet  Commonly known as:  ULTRAM  Take 1 tablet by mouth every 12 hours as needed for pain.     zolpidem 10 MG tablet  Commonly known as:  AMBIEN  Take 10 mg by mouth at bedtime as needed for sleep.       Allergies  Allergen Reactions  . Amoxil [Amoxicillin]     Per MAR  . Penicillins Other (See Comments)    unknown      The results of significant diagnostics from this  hospitalization (including imaging, microbiology, ancillary and laboratory) are listed below for reference.    Significant Diagnostic Studies: Dg Chest 2 View  07/12/2013   CLINICAL DATA:  Chest pain, shortness of breath.  EXAM: CHEST  2 VIEW  COMPARISON:  None available for comparison at time of study interpretation.  FINDINGS: The cardiac silhouette appears moderately enlarged, mediastinal silhouette is unremarkable. Central pulmonary vasculature congestion with interstitial prominence. Bibasilar lower lobe airspace opacities with small pleural effusions. No pneumothorax.  Multiple EKG lines overlie the patient and may obscure subtle underlying pathology. Large body habitus. Included osseous structures are nonsuspicious.  IMPRESSION: Moderate cardiomegaly, apparent acute on chronic pulmonary edema with bibasilar airspace opacities which may reflect atelectasis or possibly confluent edema with small bilateral pleural effusions.   Electronically Signed   By: Awilda Metro   On: 07/12/2013 04:32   Dg Chest Port 1 View  (if Code Sepsis Called)  07/26/2013   CLINICAL DATA:  Shortness of Breath  EXAM: PORTABLE CHEST - 1 VIEW  COMPARISON:  07/12/2013  FINDINGS: Borderline cardiomegaly. Mild interstitial prominence bilaterally. No convincing pulmonary edema. Poor inspiration with streaky bilateral basilar atelectasis or infiltrate.  IMPRESSION: Poor inspiration with streaky bilateral basilar atelectasis or infiltrate. Mild interstitial prominence bilaterally without convincing pulmonary edema.   Electronically Signed   By: Natasha Mead M.D.   On: 07/26/2013 20:24    Microbiology: Recent Results (from the past 240 hour(s))  CULTURE, BLOOD (ROUTINE X 2)     Status: None   Collection Time    07/26/13  8:07 PM      Result Value Range Status   Specimen Description BLOOD ARM LEFT   Final   Special Requests BOTTLES DRAWN AEROBIC ONLY 6CC   Final   Culture  Setup Time     Final   Value: 07/27/2013 11:23      Performed at [facility]   Culture     Final   Value:        BLOOD CULTURE RECEIVED NO GROWTH TO DATE CULTURE WILL BE HELD FOR 5 DAYS BEFORE ISSUING A FINAL NEGATIVE REPORT     Performed at [facility]   Report Status PENDING   Incomplete  CULTURE, BLOOD (ROUTINE X 2)     Status: None   Collection Time    07/26/13  9:28 PM      Result Value Range Status   Specimen Description BLOOD ARM RIGHT   Final   Special Requests BOTTLES DRAWN AEROBIC AND ANAEROBIC 10CC   Final   Culture  Setup Time     Final   Value: 07/27/2013 11:23     Performed at [facility]   Culture     Final   Value:        BLOOD CULTURE RECEIVED NO GROWTH TO DATE CULTURE WILL BE HELD FOR 5 DAYS BEFORE ISSUING A FINAL NEGATIVE REPORT     Performed at [facility]   Report Status PENDING   Incomplete  MRSA PCR SCREENING     Status: None   Collection Time    07/27/13 12:50 AM      Result Value Range Status   MRSA by PCR NEGATIVE  NEGATIVE Final   Comment:            The GeneXpert MRSA Assay (FDA     approved for NASAL specimens     only), is one component of a     comprehensive MRSA colonization     surveillance program. It is not     intended to diagnose MRSA     infection nor to guide or     monitor treatment for     MRSA infections.  Labs: Basic Metabolic Panel:  Recent Labs Lab 07/26/13 2002 07/27/13 0430  NA 137 137  K 3.8 4.3  CL 96 98  CO2 25 24  GLUCOSE 226* 435*  BUN 32* 36*  CREATININE 1.54* 1.67*  CALCIUM 8.8 9.1   Liver Function Tests:  Recent Labs Lab 07/26/13 2002 07/27/13 0430  AST 19 17  ALT 22 16  ALKPHOS 131* 121*  BILITOT 0.7 0.4  PROT 6.7 6.4  ALBUMIN 3.2* 2.8*   No results found for this basename: LIPASE, AMYLASE,  in the last 168 hours No results found for this basename: AMMONIA,  in the last 168 hours CBC:  Recent Labs Lab 07/26/13 2000 07/27/13 0430  WBC 26.8* 23.1*  NEUTROABS  --  22.3*  HGB 11.3* 9.9*  HCT 36.4* 32.7*   MCV 84.5 86.1  PLT 268 234   Cardiac Enzymes:  Recent Labs Lab 07/27/13 0430  TROPONINI <0.30   BNP: BNP (last 3 results)  Recent Labs  07/12/13 0315 07/26/13 2000 07/27/13 0430  PROBNP 102.7 695.5* 1093.0*   CBG:  Recent Labs Lab 07/27/13 2127 07/28/13 0729 07/28/13 1206 07/28/13 1658 07/28/13 2111  GLUCAP 216* 263* 259* 249* 180*       Signed:  FELIZ ORTIZ, Abeni Finchum  [facility] 07/29/2013, 7:42 AM

## 2013-07-28 NOTE — Progress Notes (Signed)
Nutrition Brief Note  Patient identified on the Malnutrition Screening Tool (MST) Report  Wt Readings from Last 15 Encounters:  07/28/13 264 lb 11.2 oz (120.067 kg)  07/12/13 278 lb (126.1 kg)  06/19/13 279 lb (126.554 kg)    Body mass index is 36.93 kg/(m^2). Patient meets criteria for Obesity based on current BMI.   Current diet order is carb modified, patient is consuming approximately 100% of meals at this time. Labs and medications reviewed.   Pt reports no recent wt loss or wt gain. He says that his appetite has been good.  No nutrition interventions warranted at this time. If nutrition issues arise, please consult RD.   Ebbie Latus RD, LDN

## 2013-07-29 LAB — GLUCOSE, CAPILLARY

## 2013-07-29 MED ORDER — TIOTROPIUM BROMIDE MONOHYDRATE 18 MCG IN CAPS: 18.0000 ug | ORAL_CAPSULE | Freq: Every day | RESPIRATORY_TRACT | Status: DC

## 2013-07-29 MED ORDER — PREDNISONE 10 MG PO TABS: ORAL_TABLET | ORAL | Status: DC

## 2013-07-29 MED ORDER — CLONIDINE HCL 0.1 MG PO TABS
0.1000 mg | ORAL_TABLET | ORAL | Status: DC
  Administered 2013-07-29: 0.1 mg via ORAL
  Filled 2013-07-29 (×4): qty 1

## 2013-07-29 MED ORDER — FERROUS SULFATE 325 (65 FE) MG PO TABS
325.0000 mg | ORAL_TABLET | Freq: Three times a day (TID) | ORAL | Status: DC
  Administered 2013-07-29 (×2): 325 mg via ORAL
  Filled 2013-07-29 (×4): qty 1

## 2013-07-29 MED ORDER — INSULIN ASPART 100 UNIT/ML ~~LOC~~ SOLN
0.0000 [IU] | Freq: Three times a day (TID) | SUBCUTANEOUS | Status: DC
  Administered 2013-07-29 (×2): 4 [IU] via SUBCUTANEOUS

## 2013-07-29 MED ORDER — INSULIN ASPART 100 UNIT/ML ~~LOC~~ SOLN
6.0000 [IU] | Freq: Three times a day (TID) | SUBCUTANEOUS | Status: DC
  Administered 2013-07-29: 6 [IU] via SUBCUTANEOUS

## 2013-07-29 NOTE — Progress Notes (Signed)
Bill [name] to be D/C'd Skilled nursing facility per MD order.  Discussed with the patient and all questions fully answered.    Medication List         acetaminophen 325 MG tablet  Commonly known as:  TYLENOL  Take 650 mg by mouth every 4 (four) hours as needed for mild pain.     albuterol 108 (90 BASE) MCG/ACT inhaler  Commonly known as:  PROVENTIL HFA;VENTOLIN HFA  Inhale 1 puff into the lungs every 6 (six) hours as needed for wheezing or shortness of breath.     albuterol (2.5 MG/3ML) 0.083% nebulizer solution  Commonly known as:  PROVENTIL  Take 2.5 mg by nebulization every 6 (six) hours as needed for wheezing or shortness of breath.     allopurinol 300 MG tablet  Commonly known as:  ZYLOPRIM  Take 300 mg by mouth 2 (two) times daily.     amLODipine 10 MG tablet  Commonly known as:  NORVASC  Take 10 mg by mouth daily.     budesonide-formoterol 160-4.5 MCG/ACT inhaler  Commonly known as:  SYMBICORT  Inhale 2 puffs into the lungs 2 (two) times daily.     citalopram 40 MG tablet  Commonly known as:  CELEXA  Take 40 mg by mouth daily.     clonazePAM 2 MG tablet  Commonly known as:  KLONOPIN  Take 2 mg by mouth 3 (three) times daily as needed for anxiety.     ferrous sulfate 325 (65 FE) MG tablet  Take 325 mg by mouth 3 (three) times daily with meals.     furosemide 80 MG tablet  Commonly known as:  LASIX  Take 80 mg by mouth daily.     gabapentin 100 MG capsule  Commonly known as:  NEURONTIN  Take 100 mg by mouth 2 (two) times daily.     insulin detemir 100 UNIT/ML injection  Commonly known as:  LEVEMIR  Inject 0.4 mLs (40 Units total) into the skin daily.     insulin lispro 100 UNIT/ML injection  Commonly known as:  HUMALOG  Inject 2-10 Units into the skin 4 (four) times daily -  before meals and at bedtime. Sliding scale     levofloxacin 750 MG tablet  Commonly known as:  LEVAQUIN  Take 1 tablet (750 mg total) by mouth daily.     OLANZapine 10 MG  tablet  Commonly known as:  ZYPREXA  Take 10 mg by mouth daily.     pantoprazole 40 MG tablet  Commonly known as:  PROTONIX  Take 40 mg by mouth 2 (two) times daily.     potassium chloride SA 20 MEQ tablet  Commonly known as:  K-DUR,KLOR-CON  Take 20 mEq by mouth daily.     predniSONE 10 MG tablet  Commonly known as:  DELTASONE  Takes 6 tablets for 2 days, then 5 tablets for 2 days, then 4 tablets for 2 days, then 3 tablets for 2 days, then 2 tabs for 2 days, then 1 tab for 2 days, and then stop.     predniSONE 10 MG tablet  Commonly known as:  DELTASONE  Take 10 mg by mouth daily with breakfast.     primidone 250 MG tablet  Commonly known as:  MYSOLINE  Take 250 mg by mouth 2 (two) times daily.     sitaGLIPtin-metformin 50-500 MG per tablet  Commonly known as:  JANUMET  Take 1 tablet by mouth 2 (two) times daily with a  meal.     tamsulosin 0.4 MG Caps capsule  Commonly known as:  FLOMAX  Take 0.4 mg by mouth daily.     tiotropium 18 MCG inhalation capsule  Commonly known as:  SPIRIVA  Place 18 mcg into inhaler and inhale daily.     traMADol 50 MG tablet  Commonly known as:  ULTRAM  Take 1 tablet by mouth every 12 hours as needed for pain.     zolpidem 10 MG tablet  Commonly known as:  AMBIEN  Take 10 mg by mouth at bedtime as needed for sleep.       BP elevated. MD aware and orders received before pt D/C.   Skin clean, dry and intact without evidence of skin break down, no evidence of skin tears noted. IV catheter discontinued intact. Site without signs and symptoms of complications. Dressing and pressure applied.  Patient escorted via EMS.    Aldean Ast 07/29/2013 2:31 PM

## 2013-07-29 NOTE — Progress Notes (Signed)
Pt BP is 177/78. David Stall notified and ordered for 0.1mg  Clonidine PO. Pt given 0.1mg  catapres PO and PTAR notified of situation and when medication was given.

## 2013-07-29 NOTE — Progress Notes (Signed)
Utilization review completed. Deldrick Linch, RN, BSN. 

## 2013-07-29 NOTE — Progress Notes (Signed)
Patient is set to discharge back to [facility] today. Patient aware & states he will let his daughter know. Discharge packet given to RN, Fleet Contras. [facility] scheduled for 2:00p pickup.   Unice Bailey, LCSW Clinical Social Worker cell #: [phone] (weekend coverage)

## 2013-07-29 NOTE — Progress Notes (Signed)
Report given Chinwe RN to [employer].

## 2013-07-30 NOTE — Progress Notes (Signed)
Patient ID: Bill [name], male   DOB: 1946/01/21, 67 y.o.   MRN: [id number]  Facility [name] Date of visit 07/26/2013  CODE STATUS: DO NOT RESUSCITATE  Allergies  Allergen Reactions  . Amoxil [Amoxicillin]     Per MAR  . Penicillins Other (See Comments)    unknown   CC:  Called in to see patient by nursing staff with report of temp of 100.4.  HPI:  Patient is a 67 year old Caucasian male who has a long history of COPD, with x-ray in the last 24 hours suggesting pneumonitis. Was started on Avelox 400 mg per day first dose, already given. During the early a.m. hours, patient had received a bolus dose of Solu-Medrol, 40 mg IM.  Current Outpatient Prescriptions on File Prior to Visit  Medication Sig Dispense Refill  . acetaminophen (TYLENOL) 325 MG tablet Take 650 mg by mouth every 4 (four) hours as needed for mild pain.      Marland Kitchen albuterol (PROVENTIL HFA;VENTOLIN HFA) 108 (90 BASE) MCG/ACT inhaler Inhale 1 puff into the lungs every 6 (six) hours as needed for wheezing or shortness of breath.      Marland Kitchen albuterol (PROVENTIL) (2.5 MG/3ML) 0.083% nebulizer solution Take 2.5 mg by nebulization every 6 (six) hours as needed for wheezing or shortness of breath.      . allopurinol (ZYLOPRIM) 300 MG tablet Take 300 mg by mouth 2 (two) times daily.      Marland Kitchen amLODipine (NORVASC) 10 MG tablet Take 10 mg by mouth daily.      . budesonide-formoterol (SYMBICORT) 160-4.5 MCG/ACT inhaler Inhale 2 puffs into the lungs 2 (two) times daily.      . citalopram (CELEXA) 40 MG tablet Take 40 mg by mouth daily.      . clonazePAM (KLONOPIN) 2 MG tablet Take 2 mg by mouth 3 (three) times daily as needed for anxiety.      . ferrous sulfate 325 (65 FE) MG tablet Take 325 mg by mouth 3 (three) times daily with meals.      . furosemide (LASIX) 80 MG tablet Take 80 mg by mouth daily.      Marland Kitchen gabapentin (NEURONTIN) 100 MG capsule Take 100 mg by mouth 2 (two) times daily.      . insulin detemir (LEVEMIR) 100 UNIT/ML injection  Inject 0.4 mLs (40 Units total) into the skin daily.  10 mL  12  . insulin lispro (HUMALOG) 100 UNIT/ML injection Inject 2-10 Units into the skin 4 (four) times daily -  before meals and at bedtime. Sliding scale      . levofloxacin (LEVAQUIN) 750 MG tablet Take 1 tablet (750 mg total) by mouth daily.  8 tablet  0  . OLANZapine (ZYPREXA) 10 MG tablet Take 10 mg by mouth daily.      . pantoprazole (PROTONIX) 40 MG tablet Take 40 mg by mouth 2 (two) times daily.      . potassium chloride SA (K-DUR,KLOR-CON) 20 MEQ tablet Take 20 mEq by mouth daily.      . predniSONE (DELTASONE) 10 MG tablet Take 10 mg by mouth daily with breakfast.      . predniSONE (DELTASONE) 10 MG tablet Takes 6 tablets for 2 days, then 5 tablets for 2 days, then 4 tablets for 2 days, then 3 tablets for 2 days, then 2 tabs for 2 days, then 1 tab for 2 days, and then stop.  42 tablet  0  . primidone (MYSOLINE) 250 MG tablet Take 250 mg  by mouth 2 (two) times daily.      . sitaGLIPtin-metformin (JANUMET) 50-500 MG per tablet Take 1 tablet by mouth 2 (two) times daily with a meal.      . tamsulosin (FLOMAX) 0.4 MG CAPS capsule Take 0.4 mg by mouth daily.      Marland Kitchen tiotropium (SPIRIVA) 18 MCG inhalation capsule Place 18 mcg into inhaler and inhale daily.      . traMADol (ULTRAM) 50 MG tablet Take 1 tablet by mouth every 12 hours as needed for pain.  60 tablet  0  . zolpidem (AMBIEN) 10 MG tablet Take 10 mg by mouth at bedtime as needed for sleep.       No current facility-administered medications on file prior to visit.   Past Medical History  Diagnosis Date  . COPD (chronic obstructive pulmonary disease)   . Diabetes mellitus without complication   . Hyperlipidemia   . Depression   . CHF (congestive heart failure)   . Gout   . OSA (obstructive sleep apnea)    No past surgical history on file.  BP 188/86  Pulse 131  Temp(Src) 100.4 F (38 C) (Oral)  Resp 26  SpO2 91%  Physical examination:  Gen. patient is alert,  tremulous, certainly oriented, in no acute physiological compromise but certainly in acute distress. He is receiving oxygen at 2 L which is in place. Bilateral breath sounds are actually without any distinct rales or rhonchi. Excellent air movement is heard throughout all lung fields. Tachycardic, regular rate and rhythm. Abdomen positive bowel sounds Bilateral lower extremities positive for edema 2-3+ max at the medial ankle but extending to the knees only, ending below the posterior thigh.  Impression: Pneumonitis per x-ray, addressed with Avelox 400 mg per day Likely exacerbation of COPD patient given bolus dose of Solu-Medrol at 40 mg IM We'll continue his current protocol may need to increase. Patient offered option to transport to emergency room, patient declined stating he would rather not go to the hospital at this time. Patient knows that he may change his mind on this issue at any time.

## 2013-07-31 ENCOUNTER — Non-Acute Institutional Stay (SKILLED_NURSING_FACILITY): Payer: Medicare Other | Admitting: Internal Medicine

## 2013-07-31 DIAGNOSIS — E1149 Type 2 diabetes mellitus with other diabetic neurological complication: Secondary | ICD-10-CM

## 2013-07-31 DIAGNOSIS — D638 Anemia in other chronic diseases classified elsewhere: Secondary | ICD-10-CM

## 2013-07-31 DIAGNOSIS — F3289 Other specified depressive episodes: Secondary | ICD-10-CM

## 2013-07-31 DIAGNOSIS — R5381 Other malaise: Secondary | ICD-10-CM

## 2013-07-31 DIAGNOSIS — F329 Major depressive disorder, single episode, unspecified: Secondary | ICD-10-CM

## 2013-07-31 DIAGNOSIS — J441 Chronic obstructive pulmonary disease with (acute) exacerbation: Secondary | ICD-10-CM

## 2013-07-31 DIAGNOSIS — M109 Gout, unspecified: Secondary | ICD-10-CM

## 2013-07-31 DIAGNOSIS — E1142 Type 2 diabetes mellitus with diabetic polyneuropathy: Secondary | ICD-10-CM

## 2013-07-31 DIAGNOSIS — K219 Gastro-esophageal reflux disease without esophagitis: Secondary | ICD-10-CM

## 2013-07-31 HISTORY — DX: Anemia in other chronic diseases classified elsewhere: D63.8

## 2013-07-31 NOTE — Progress Notes (Signed)
Patient ID: Bill [name], male   DOB: 1946/02/08, 67 y.o.   MRN: [id number]  [facility]    PCP: Oneal Grout, MD  Code Status: dnr  Allergies  Allergen Reactions  . Amoxil [Amoxicillin]     Per MAR  . Penicillins Other (See Comments)    unknown    Chief Complaint: re-admit from hospital  HPI:  67 y/o male patient is here after readmission to the hospital from 07/26/13- 07/29/13 with worsening dyspnea and copd exacerbation. He required BiPAP and NRB along with nebulizer and solumedrol treatment. He has history of COPD, CHF, DM and hypertension. He also had ARF in the hospital and improved with iv fluids. He was sent back to the facility for rehabilitation He did not work with therapy today as he feels tired He denies other complaints  Review of Systems  Constitutional: Negative for fever, chills, weight loss, malaise/fatigue and diaphoresis.  HENT: Negative for congestion, hearing loss and sore throat.   Eyes: Negative for blurred vision, double vision and discharge.  Respiratory: Negative for cough, sputum production. Has some shortness of breath and wheezing.   Cardiovascular: Negative for chest pain, palpitations, orthopnea and leg swelling.  Gastrointestinal: Negative for heartburn, nausea, vomiting, abdominal pain, diarrhea and constipation.  Genitourinary: Negative for dysuria, urgency, frequency and flank pain.  Musculoskeletal: Negative for joint pain and myalgias. has low back pain. Had a fall few days back while trying to get out of bed by himself and fell on his buttock Skin: Negative for itching and rash.  Neurological: Positive for weakness. Negative for dizziness, tingling, focal weakness and headaches.  Psychiatric/Behavioral: Negative for depression and memory loss. The patient is not nervous/anxious.      Past Medical History  Diagnosis Date  . COPD (chronic obstructive pulmonary disease)   . Diabetes mellitus without complication   .  Hyperlipidemia   . Depression   . CHF (congestive heart failure)   . Gout   . OSA (obstructive sleep apnea)    No past surgical history on file. Social History:   reports that he has quit smoking. He does not have any smokeless tobacco history on file. He reports that he does not drink alcohol or use illicit drugs.  Family History  Problem Relation Age of Onset  . Diabetes Mother   . Heart disease Mother     Medications: Patient's Medications  New Prescriptions   No medications on file  Previous Medications   ACETAMINOPHEN (TYLENOL) 325 MG TABLET    Take 650 mg by mouth every 4 (four) hours as needed for mild pain.   ALBUTEROL (PROVENTIL HFA;VENTOLIN HFA) 108 (90 BASE) MCG/ACT INHALER    Inhale 1 puff into the lungs every 6 (six) hours as needed for wheezing or shortness of breath.   ALBUTEROL (PROVENTIL) (2.5 MG/3ML) 0.083% NEBULIZER SOLUTION    Take 2.5 mg by nebulization every 6 (six) hours as needed for wheezing or shortness of breath.   ALLOPURINOL (ZYLOPRIM) 300 MG TABLET    Take 300 mg by mouth 2 (two) times daily.   AMLODIPINE (NORVASC) 10 MG TABLET    Take 10 mg by mouth daily.   BUDESONIDE-FORMOTEROL (SYMBICORT) 160-4.5 MCG/ACT INHALER    Inhale 2 puffs into the lungs 2 (two) times daily.   CITALOPRAM (CELEXA) 40 MG TABLET    Take 40 mg by mouth daily.   CLONAZEPAM (KLONOPIN) 2 MG TABLET    Take 2 mg by mouth 3 (three) times daily as needed  for anxiety.   FERROUS SULFATE 325 (65 FE) MG TABLET    Take 325 mg by mouth 3 (three) times daily with meals.   FUROSEMIDE (LASIX) 80 MG TABLET    Take 80 mg by mouth daily.   GABAPENTIN (NEURONTIN) 100 MG CAPSULE    Take 100 mg by mouth 2 (two) times daily.   INSULIN DETEMIR (LEVEMIR) 100 UNIT/ML INJECTION    Inject 0.4 mLs (40 Units total) into the skin daily.   INSULIN LISPRO (HUMALOG) 100 UNIT/ML INJECTION    Inject 2-10 Units into the skin 4 (four) times daily -  before meals and at bedtime. Sliding scale   LEVOFLOXACIN (LEVAQUIN)  750 MG TABLET    Take 1 tablet (750 mg total) by mouth daily.   OLANZAPINE (ZYPREXA) 10 MG TABLET    Take 10 mg by mouth daily.   PANTOPRAZOLE (PROTONIX) 40 MG TABLET    Take 40 mg by mouth 2 (two) times daily.   POTASSIUM CHLORIDE SA (K-DUR,KLOR-CON) 20 MEQ TABLET    Take 20 mEq by mouth daily.   PREDNISONE (DELTASONE) 10 MG TABLET    Take 10 mg by mouth daily with breakfast.   PREDNISONE (DELTASONE) 10 MG TABLET    Takes 6 tablets for 2 days, then 5 tablets for 2 days, then 4 tablets for 2 days, then 3 tablets for 2 days, then 2 tabs for 2 days, then 1 tab for 2 days, and then stop.   PRIMIDONE (MYSOLINE) 250 MG TABLET    Take 250 mg by mouth 2 (two) times daily.   SITAGLIPTIN-METFORMIN (JANUMET) 50-500 MG PER TABLET    Take 1 tablet by mouth 2 (two) times daily with a meal.   TAMSULOSIN (FLOMAX) 0.4 MG CAPS CAPSULE    Take 0.4 mg by mouth daily.   TIOTROPIUM (SPIRIVA) 18 MCG INHALATION CAPSULE    Place 18 mcg into inhaler and inhale daily.   TRAMADOL (ULTRAM) 50 MG TABLET    Take 1 tablet by mouth every 12 hours as needed for pain.   ZOLPIDEM (AMBIEN) 10 MG TABLET    Take 10 mg by mouth at bedtime as needed for sleep.  Modified Medications   No medications on file  Discontinued Medications   No medications on file     Physical Exam:  Filed Vitals:   07/31/13 1057  BP: 104/58  Pulse: 84  Temp: 99.8 F (37.7 C)  Resp: 20   Constitutional: He is oriented to person, place, and time.  Obese, in no acute distress  HENT:   Head: Normocephalic and atraumatic.  Eyes: Conjunctivae and EOM are normal. Pupils are equal, round, and reactive to light.  Neck: Normal range of motion. Neck supple. No JVD present. No thyromegaly present.  Cardiovascular: Normal rate and regular rhythm.   Pulmonary/Chest: Effort normal and breath sounds normal. No respiratory distress. He has no wheezes. He has no rales. He exhibits no tenderness.  Abdominal: Soft. Bowel sounds are normal. He exhibits no  distension and no mass. There is no tenderness. There is no rebound and no guarding.  Musculoskeletal: Normal range of motion.  Lymphadenopathy:   He has no cervical adenopathy.  Neurological: He is alert and oriented to person, place, and time. No cranial nerve deficit.  Skin: Skin is warm and dry. No rash noted.  Psychiatric: He has a normal mood and affect. His behavior is normal. Judgment and thought content normal.     Labs reviewed: Basic Metabolic Panel:  Recent Labs  07/12/13 0315 07/26/13 2002 07/27/13 0430  NA 136 137 137  K 3.5 3.8 4.3  CL 97 96 98  CO2 28 25 24   GLUCOSE 161* 226* 435*  BUN 18 32* 36*  CREATININE 1.09 1.54* 1.67*  CALCIUM 8.8 8.8 9.1   Liver Function Tests:  Recent Labs  07/26/13 2002 07/27/13 0430  AST 19 17  ALT 22 16  ALKPHOS 131* 121*  BILITOT 0.7 0.4  PROT 6.7 6.4  ALBUMIN 3.2* 2.8*   CBC:  Recent Labs  07/12/13 0315 07/26/13 2000 07/27/13 0430  WBC 9.8 26.8* 23.1*  NEUTROABS 7.7  --  22.3*  HGB 8.9* 11.3* 9.9*  HCT 29.5* 36.4* 32.7*  MCV 86.0 84.5 86.1  PLT 199 268 234   Cardiac Enzymes:  Recent Labs  07/12/13 0315 07/27/13 0430  TROPONINI <0.30 <0.30   Lab Results  Component Value Date   HGBA1C 7.2* 07/27/2013   Radiological Exams: Dg Chest 2 View  07/12/2013   CLINICAL DATA:  Chest pain, shortness of breath.  EXAM: CHEST  2 VIEW COMPARISON:  None available for comparison at time of study interpretation.  FINDINGS: The cardiac silhouette appears moderately enlarged, mediastinal silhouette is unremarkable. Central pulmonary vasculature congestion with interstitial prominence. Bibasilar lower lobe airspace opacities with small pleural effusions. No pneumothorax.  Multiple EKG lines overlie the patient and may obscure subtle underlying pathology. Large body habitus. Included osseous structures are nonsuspicious.  IMPRESSION: Moderate cardiomegaly, apparent acute on chronic pulmonary edema with bibasilar airspace  opacities which may reflect atelectasis or possibly confluent edema with small bilateral pleural effusions.   Electronically Signed   By: Awilda Metro   On: 07/12/2013 04:32   Dg Chest Port 1 View  (if Code Sepsis Called)  07/26/2013   CLINICAL DATA:  Shortness of Breath  EXAM: PORTABLE CHEST - 1 VIEW  COMPARISON:  07/12/2013  FINDINGS: Borderline cardiomegaly. Mild interstitial prominence bilaterally. No convincing pulmonary edema. Poor inspiration with streaky bilateral basilar atelectasis or infiltrate.  IMPRESSION: Poor inspiration with streaky bilateral basilar atelectasis or infiltrate. Mild interstitial prominence bilaterally without convincing pulmonary edema.   Electronically Signed   By: Natasha Mead M.D.   On: 07/26/2013 20:24    Assessment/Plan  Copd exacerbation- recent exacerbation. To complete the tapering course of prednisone along with his symbicort, spiriva and proventil. After completion of tapered course, continue chronic prednisone course  Deconditioning- in setting of recent acute illness.  Will have him work with PT and OT. His chronic respiratory disease can cause limitation   Diabetes mellitus type 2- reviewed a1c.will monitor his cbg. To continue janumet with his levemir and SSI  Hypertension- bp under control at present. Continue amlodipine 10 mg daily and lasix 80 mg daily. Monitor bp readings. Continue kcl supplement. Check bmp  Anemia- likely from ? Gi bleed- pt has refused EGD/ colonoscopy in past and no recent reported blood loss.continue iron supplement 325 mg tid and PPI for now  Depression- mood stable currently, will continue his citalopram 40 mg daily and olanzapine 15 mg daily. Will continue his ambien to help aid him sleep  Peripheral neuropathy- to keep cbg under control and continue gabapentin 100 mg bid for now  GERD- continue PPI for now protonix 40 mg bid  BPH- continue his home regimen flomax for now  Essential tremors- continue primidone 250  mg bid for now and prn klonopin  Labs ordered- cbc, bmp  Reviewed care plan with patient and charge nurse

## 2013-08-01 ENCOUNTER — Encounter: Payer: Self-pay | Admitting: Nurse Practitioner

## 2013-08-01 ENCOUNTER — Non-Acute Institutional Stay (SKILLED_NURSING_FACILITY): Payer: Medicare Other | Admitting: Nurse Practitioner

## 2013-08-01 ENCOUNTER — Non-Acute Institutional Stay: Payer: Medicare Other | Admitting: Nurse Practitioner

## 2013-08-01 DIAGNOSIS — L0231 Cutaneous abscess of buttock: Secondary | ICD-10-CM | POA: Insufficient documentation

## 2013-08-01 DIAGNOSIS — N400 Enlarged prostate without lower urinary tract symptoms: Secondary | ICD-10-CM

## 2013-08-01 DIAGNOSIS — J4489 Other specified chronic obstructive pulmonary disease: Secondary | ICD-10-CM

## 2013-08-01 DIAGNOSIS — D638 Anemia in other chronic diseases classified elsewhere: Secondary | ICD-10-CM

## 2013-08-01 DIAGNOSIS — L02215 Cutaneous abscess of perineum: Secondary | ICD-10-CM | POA: Insufficient documentation

## 2013-08-01 DIAGNOSIS — E1142 Type 2 diabetes mellitus with diabetic polyneuropathy: Secondary | ICD-10-CM

## 2013-08-01 DIAGNOSIS — R338 Other retention of urine: Secondary | ICD-10-CM

## 2013-08-01 DIAGNOSIS — R031 Nonspecific low blood-pressure reading: Secondary | ICD-10-CM

## 2013-08-01 DIAGNOSIS — J449 Chronic obstructive pulmonary disease, unspecified: Secondary | ICD-10-CM

## 2013-08-01 DIAGNOSIS — R5381 Other malaise: Secondary | ICD-10-CM

## 2013-08-01 HISTORY — DX: Cutaneous abscess of perineum: L02.215

## 2013-08-02 ENCOUNTER — Encounter: Payer: Self-pay | Admitting: Nurse Practitioner

## 2013-08-02 ENCOUNTER — Non-Acute Institutional Stay (SKILLED_NURSING_FACILITY): Payer: Medicare Other | Admitting: Nurse Practitioner

## 2013-08-02 DIAGNOSIS — R338 Other retention of urine: Secondary | ICD-10-CM

## 2013-08-02 DIAGNOSIS — L02215 Cutaneous abscess of perineum: Secondary | ICD-10-CM

## 2013-08-02 DIAGNOSIS — R031 Nonspecific low blood-pressure reading: Secondary | ICD-10-CM

## 2013-08-02 DIAGNOSIS — K219 Gastro-esophageal reflux disease without esophagitis: Secondary | ICD-10-CM

## 2013-08-02 DIAGNOSIS — L02219 Cutaneous abscess of trunk, unspecified: Secondary | ICD-10-CM

## 2013-08-02 DIAGNOSIS — J449 Chronic obstructive pulmonary disease, unspecified: Secondary | ICD-10-CM

## 2013-08-02 DIAGNOSIS — E1142 Type 2 diabetes mellitus with diabetic polyneuropathy: Secondary | ICD-10-CM

## 2013-08-02 DIAGNOSIS — N400 Enlarged prostate without lower urinary tract symptoms: Secondary | ICD-10-CM

## 2013-08-02 LAB — CULTURE, BLOOD (ROUTINE X 2): Culture: NO GROWTH

## 2013-08-03 ENCOUNTER — Other Ambulatory Visit: Payer: Self-pay | Admitting: Internal Medicine

## 2013-08-03 ENCOUNTER — Non-Acute Institutional Stay (SKILLED_NURSING_FACILITY): Payer: Medicare Other | Admitting: Nurse Practitioner

## 2013-08-03 DIAGNOSIS — L02219 Cutaneous abscess of trunk, unspecified: Secondary | ICD-10-CM

## 2013-08-03 DIAGNOSIS — L02215 Cutaneous abscess of perineum: Secondary | ICD-10-CM

## 2013-08-03 DIAGNOSIS — J441 Chronic obstructive pulmonary disease with (acute) exacerbation: Secondary | ICD-10-CM

## 2013-08-03 DIAGNOSIS — R7309 Other abnormal glucose: Secondary | ICD-10-CM

## 2013-08-03 DIAGNOSIS — R739 Hyperglycemia, unspecified: Secondary | ICD-10-CM

## 2013-08-03 DIAGNOSIS — R609 Edema, unspecified: Secondary | ICD-10-CM

## 2013-08-03 DIAGNOSIS — K651 Peritoneal abscess: Secondary | ICD-10-CM

## 2013-08-03 DIAGNOSIS — R5381 Other malaise: Secondary | ICD-10-CM

## 2013-08-03 DIAGNOSIS — J449 Chronic obstructive pulmonary disease, unspecified: Secondary | ICD-10-CM

## 2013-08-05 ENCOUNTER — Encounter: Payer: Self-pay | Admitting: Nurse Practitioner

## 2013-08-05 DIAGNOSIS — R031 Nonspecific low blood-pressure reading: Secondary | ICD-10-CM | POA: Insufficient documentation

## 2013-08-05 NOTE — Progress Notes (Signed)
Date of Visit 08/01/2013 [name] room [facility]   Patient ID: Bill [name], male    DOB: [birth year], 67 y.o.   MRN: [id number]  CODE STATUS: DNR  Chief Complaint  Patient presents with  . Acute Visit    Allergies  Allergen Reactions  . Amoxil [Amoxicillin]     Per MAR  . Penicillins Other (See Comments)    unknown   HPI Patient being seen today related to report by nursing staff that his systolic blood pressures have been as low as 90 and his diastolic pressures in the 50s. Also in p.m., report by patient that he has not been able to urinate since the morning of 08/01/2013.   Review of Systems  Constitutional: Positive for diaphoresis and fatigue.  HENT: Negative for congestion, dental problem, drooling, ear discharge, ear pain, nosebleeds and tinnitus.   Eyes: Negative for photophobia, pain, discharge, redness and visual disturbance.  Respiratory: Positive for cough, shortness of breath and wheezing. Negative for choking.   Cardiovascular: Positive for palpitations and leg swelling.  Gastrointestinal: Negative for blood in stool and anal bleeding.  Endocrine: Negative for cold intolerance and heat intolerance.  Genitourinary: Positive for difficulty urinating. Negative for hematuria, discharge and genital sores.  Skin: Positive for pallor.  Allergic/Immunologic: Negative for environmental allergies and food allergies.  Neurological: Positive for tremors and weakness.  Hematological: Negative for adenopathy. Bruises/bleeds easily.  Psychiatric/Behavioral: Negative for suicidal ideas, hallucinations, behavioral problems, self-injury and agitation. The patient is nervous/anxious.    Recent labs: 07/31/2013 WBC 13.1 RBC 3.9 Hemoglobin 9.2 Hematocrit 32.8 MCV 85.2 MCH 23.9 MCHC 28 RDW 18.2 Platelets 267  Hemoglobin A1c 7.7  Sodium 137 Potassium 3.9 Chloride 99 CO2 30 AGAP 9 Glucose 136 BUN 26 Creatinine 1 Calcium 8 Total protein 5.6 Albumin  2.7 Globulin 2.9 Total bilirubin 0.3 ALP 93 AST 19 ALT 28  BP 108/50  Pulse 118  Temp(Src) 98.4 F (36.9 C)  Resp 22  SpO2 95%        Physical Exam  Vitals reviewed. Constitutional: He appears distressed.  Patient is a super morbidly obese Caucasian male who is obviously uncomfortable but in no acute physiological compromise.  HENT:  Head: Normocephalic and atraumatic.  Right Ear: External ear normal.  Left Ear: External ear normal.  Nose: Nose normal.  Mouth/Throat: No oropharyngeal exudate.  Eyes: EOM are normal. Pupils are equal, round, and reactive to light. Right eye exhibits no discharge. Left eye exhibits no discharge. No scleral icterus.  Neck: No JVD present. No tracheal deviation present. No thyromegaly present.  Cardiovascular: Intact distal pulses.  Exam reveals no gallop and no friction rub.   Tachycardic with a rate of 100-120, otherwise unremarkable.  Abdominal: Bowel sounds are normal. There is no tenderness. There is no guarding.  Musculoskeletal: He exhibits edema.  Patient with previous significant edema. Today, edema trace to 1+ at the medial ankles otherwise unremarkable.  Lymphadenopathy:    He has no cervical adenopathy.  Neurological: He is alert.  Coarse tremors involving upper extremities accentuated with movement.  Skin: Skin is warm. No rash noted. He is diaphoretic. No erythema.  Psychiatric: He has a normal mood and affect. His behavior is normal.  Concern that patient is very well aware he is a poorly controlled diabetic yet eats large amounts of sweets, junk food, and sugar sweetened beverages.      Assessment & Plan:  New presence of hypotension and virtually no edema, will stop Lasix at this  time. We'll continue examination to determine presence of edema. We will also repeat BMP to follow electrolyte balance and hydration status. We'll continue the patient's other cardiac meds including Norvasc. COPD, will continue history of a,  Symbicort, and Proventil. The patient has Proventil available via inhaler and nebulizer. The patient will receive 50 mg of prednisone tonight and then for the next 2 nights will receive 20 mg of prednisone, the prednisone will decrease by 10 mg every 2 nights. The patient likely will be maintained on a 10 mg prednisone tablet each day. Diabetes, will continue patient's Levemir  and Janumet, at 500/50. We'll also obtain a dietary consult to assist with better food choices as well as food choices. Anemia we'll continue his supplements including ferrous sulfate Gout we'll continue his allopurinol 300 mg GERD we will certainly continue his Protonix, as untreated GERD could worsen his respiratory status. The patient's depression will continue his Celexa at 40 mg, and his Zyprexa at 10 mg. Will continue to have Klonopin available 2 mg when necessary.  We'll continue his Mysoline 250 mg

## 2013-08-05 NOTE — Progress Notes (Deleted)
Date of Service 08/01/2013 [facility], Rm 207    Patient ID: Bill [name], male    DOB: 02/14/1946, 67 y.o.   MRN: [id number]  Code Status:  DNR  Chief Complaint  Patient presents with  . Acute Visit    HPI  report received staff this a.m. related to patient's significantly decreased blood pressures, as low as systolic of 90/50. Early evening report from patient and he has not been able to urinate since this a.m.    Review of Systems  Constitutional: Positive for diaphoresis. Negative for fever.  HENT: Negative for ear discharge, ear pain, hearing loss and tinnitus.   Eyes: Negative for double vision, pain and discharge.  Respiratory: Positive for shortness of breath and wheezing. Negative for hemoptysis.   Cardiovascular: Positive for palpitations and leg swelling.  Gastrointestinal: Positive for heartburn. Negative for diarrhea and blood in stool.  Endocrine: Negative for polydipsia.  Genitourinary: Negative for hematuria and flank pain.       Patient reported an approximately 7 PM that he had not been in the urinate since that a.m. On Tuesday.  Musculoskeletal: Positive for falls. Negative for neck pain.       Patient has reported episode of sliding out of bed  Neurological: Positive for tremors and weakness. Negative for seizures and loss of consciousness.  Hematological: Bruises/bleeds easily.       Patient with a very long history of systemic corticosteroid therapy and cigarette smoking.  Psychiatric/Behavioral: Negative for suicidal ideas and substance abuse.   Review of Systems  Constitutional: Positive for diaphoresis. Negative for fever.  HENT: Negative for ear discharge, ear pain, hearing loss and tinnitus.   Eyes: Negative for double vision, pain and discharge.  Respiratory: Positive for shortness of breath and wheezing. Negative for hemoptysis.   Cardiovascular: Positive for palpitations and leg swelling.  Gastrointestinal: Positive for heartburn. Negative for  diarrhea and blood in stool.  Genitourinary: Negative for hematuria and flank pain.       Patient reported an approximately 7 PM that he had not been in the urinate since that a.m. On Tuesday.  Musculoskeletal: Positive for falls. Negative for neck pain.       Patient has reported episode of sliding out of bed  Neurological: Positive for tremors and weakness. Negative for seizures and loss of consciousness.  Endo/Heme/Allergies: Negative for polydipsia. Bruises/bleeds easily.       Patient with a very long history of systemic corticosteroid therapy and cigarette smoking.  Psychiatric/Behavioral: Negative for suicidal ideas and substance abuse.   Lab from  07/31/2013  WBC 13.0 RBC 3.9 Hemoglobin 9.2 Hematocrit 32.8 MCV 85.2 MCH 23.9 MCHC 28 RDW 18.2 Platelet 267  Hemoglobin A1c 7.2  Sodium 137 Potassium 3.9 Chloride 99 CO2 30 AGAP 9 Glucose 136 BUN 26 Creatinine 1 Calcium 8.0 Total protein 5.6 Albumin 2.7 Globulin 2.9 Total bilirubin 0.3 ALP 93 AST 19 ALT 28  BP 108/50  Pulse 118  Temp(Src) 98.4 F (36.9 C)  Resp 22  SpO2 95%    Objective:   Physical Exam  Constitutional:  Is a super morbidly obese Caucasian male in no acute physiological compromise  HENT:  Head: Normocephalic and atraumatic.  Right Ear: External ear normal.  Left Ear: External ear normal.  Nose: Nose normal.  Mouth/Throat: No oropharyngeal exudate.  Eyes: EOM are normal. Pupils are equal, round, and reactive to light. Right eye exhibits no discharge. Left eye exhibits no discharge. No scleral icterus.  Neck: No tracheal deviation present.  No thyromegaly present.  Cardiovascular: Normal heart sounds and intact distal pulses.  Exam reveals no gallop and no friction rub.   Tachycardic with a rate of 100-120. Otherwise unremarkable.  Pulmonary/Chest: No respiratory distress. He exhibits no tenderness.  Bilateral breath sounds diminished, only minimal Rales and wheezes. Otherwise  unremarkable.  Abdominal: Bowel sounds are normal. He exhibits no mass. There is no tenderness. There is no guarding.  Musculoskeletal: He exhibits edema. He exhibits no tenderness.  Only minimal lower extremity edema, predominantly trace to 1+ medial ankles  Lymphadenopathy:    He has no cervical adenopathy.  Neurological: He is alert.  Ongoing upper extremity tremors clinically noted with movement.  Skin: Skin is warm. He is diaphoretic. There is pallor.  Psychiatric: He has a normal mood and affect.   Physical Exam  Constitutional:  Is a super morbidly obese Caucasian male in no acute physiological compromise  HENT:  Head: Normocephalic and atraumatic.  Right Ear: External ear normal.  Left Ear: External ear normal.  Nose: Nose normal.  Mouth/Throat: No oropharyngeal exudate.  Eyes: EOM are normal. Pupils are equal, round, and reactive to light. Right eye exhibits no discharge. Left eye exhibits no discharge. No scleral icterus.  Neck: No tracheal deviation present. No thyromegaly present.  Cardiovascular: Normal heart sounds and intact distal pulses.  Exam reveals no gallop and no friction rub.   Tachycardic with a rate of 100-120. Otherwise unremarkable.  Respiratory: No respiratory distress. He exhibits no tenderness.  Bilateral breath sounds diminished, only minimal Rales and wheezes. Otherwise unremarkable.  GI: Bowel sounds are normal. He exhibits no mass. There is no tenderness. There is no guarding.  Musculoskeletal: He exhibits edema. He exhibits no tenderness.  Only minimal lower extremity edema, predominantly trace to 1+ medial ankles  Lymphadenopathy:    He has no cervical adenopathy.  Neurological: He is alert.  Ongoing upper extremity tremors clinically noted with movement.  Skin: Skin is warm. He is diaphoretic. There is pallor.  Psychiatric: He has a normal mood and affect.     Assessment & Plan: Development of hypotension

## 2013-08-05 NOTE — Progress Notes (Signed)
Date of Visit:  August 01, 2013  Subjective:     Patient ID: Bill [name], male    DOB: [birth year], 67 y.o.   MRN: [id number]  Bill [name] Place Rm 207  Code Status:  DNR  Allergies  Allergen Reactions  . Amoxil [Amoxicillin]     Per MAR  . Penicillins Other (See Comments)    unknown    Chief Complaint  Patient presents with  . Acute Visit    HPI Patient is a 67 year old Caucasian male. Called in to see the patient by the wound nurse related to a new abscess. The patient has had an area of skin irritation which apparently has evolved to an abscess. The skin irritation had been treated with appropriate barrier creams and antifungals.  Current Outpatient Prescriptions on File Prior to Visit  Medication Sig Dispense Refill  . tamsulosin (FLOMAX) 0.4 MG CAPS capsule Take 0.4 mg by mouth daily.      Marland Kitchen acetaminophen (TYLENOL) 325 MG tablet Take 650 mg by mouth every 4 (four) hours as needed for mild pain.      Marland Kitchen albuterol (PROVENTIL HFA;VENTOLIN HFA) 108 (90 BASE) MCG/ACT inhaler Inhale 1 puff into the lungs every 6 (six) hours as needed for wheezing or shortness of breath.      Marland Kitchen albuterol (PROVENTIL) (2.5 MG/3ML) 0.083% nebulizer solution Take 2.5 mg by nebulization every 6 (six) hours as needed for wheezing or shortness of breath.      . allopurinol (ZYLOPRIM) 300 MG tablet Take 300 mg by mouth 2 (two) times daily.      Marland Kitchen amLODipine (NORVASC) 10 MG tablet Take 10 mg by mouth daily.      . budesonide-formoterol (SYMBICORT) 160-4.5 MCG/ACT inhaler Inhale 2 puffs into the lungs 2 (two) times daily.      . citalopram (CELEXA) 40 MG tablet Take 40 mg by mouth daily.      . clonazePAM (KLONOPIN) 2 MG tablet Take 2 mg by mouth 3 (three) times daily as needed for anxiety.      . ferrous sulfate 325 (65 FE) MG tablet Take 325 mg by mouth 3 (three) times daily with meals.      . furosemide (LASIX) 80 MG tablet Take 80 mg by mouth daily.      Marland Kitchen gabapentin (NEURONTIN) 100 MG capsule Take 100  mg by mouth 2 (two) times daily.      . insulin detemir (LEVEMIR) 100 UNIT/ML injection Inject 0.4 mLs (40 Units total) into the skin daily.  10 mL  12  . insulin lispro (HUMALOG) 100 UNIT/ML injection Inject 2-10 Units into the skin 4 (four) times daily -  before meals and at bedtime. Sliding scale      . levofloxacin (LEVAQUIN) 750 MG tablet Take 1 tablet (750 mg total) by mouth daily.  8 tablet  0  . OLANZapine (ZYPREXA) 10 MG tablet Take 10 mg by mouth daily.      . pantoprazole (PROTONIX) 40 MG tablet Take 40 mg by mouth 2 (two) times daily.      . potassium chloride SA (K-DUR,KLOR-CON) 20 MEQ tablet Take 20 mEq by mouth daily.      . predniSONE (DELTASONE) 10 MG tablet Takes 6 tablets for 2 days, then 5 tablets for 2 days, then 4 tablets for 2 days, then 3 tablets for 2 days, then 2 tabs for 2 days, then 1 tab for 2 days, and then stop.  42 tablet  0  . primidone (MYSOLINE) 250  MG tablet Take 250 mg by mouth 2 (two) times daily.      . sitaGLIPtin-metformin (JANUMET) 50-500 MG per tablet Take 1 tablet by mouth 2 (two) times daily with a meal.      . tiotropium (SPIRIVA) 18 MCG inhalation capsule Place 18 mcg into inhaler and inhale daily.      . traMADol (ULTRAM) 50 MG tablet Take 1 tablet by mouth every 12 hours as needed for pain.  60 tablet  0  . zolpidem (AMBIEN) 10 MG tablet Take 10 mg by mouth at bedtime as needed for sleep.       No current facility-administered medications on file prior to visit.    Past Medical History  Diagnosis Date  . COPD (chronic obstructive pulmonary disease)   . Diabetes mellitus without complication   . Hyperlipidemia   . Depression   . CHF (congestive heart failure)   . Gout   . OSA (obstructive sleep apnea)    No past surgical history on file.  Review of Systems ROS Labs: 07/31/2013 WBC 13.0 RBC 3.9 Hemoglobin 9.2 Hematocrit 32.8 MCV 85.2 MCH 23.9 MCHC 28 RDW 18.2 Platelets 267  Hemoglobin A1c 7.7  Sodium 137 Potassium  3.9 Chloride 99 CO2 30 AGAP 9 Glucose 136 BUN 26 Creatinine 1.0 Calcium 8.0 Total protein 5.6 Albumin 2.7 Globulin 2.9 Total bilirubin 0.3 ALP 93 AST 19 ALT 28  BP 108/50  Pulse 118  Temp(Src) 98.4 F (36.9 C)  Resp 22  SpO2 95%    Objective:     Physical Exam  Constitutional:  Super morbidly obese Caucasian male who is in no acute compromise but does not feel well.  HENT:  Head: Normocephalic and atraumatic.  Right Ear: External ear normal.  Left Ear: External ear normal.  Mouth/Throat: Oropharynx is clear and moist. No oropharyngeal exudate.  Eyes: EOM are normal. Pupils are equal, round, and reactive to light. Right eye exhibits no discharge. Left eye exhibits no discharge. No scleral icterus.  Neck: No tracheal deviation present.  Lymphadenopathy:    He has no cervical adenopathy.  Skin: He is diaphoretic.    Physical Exam  Constitutional:  Super morbidly obese Caucasian male who is in no acute compromise but does not feel well.  HENT:  Head: Normocephalic and atraumatic.  Right Ear: External ear normal.  Left Ear: External ear normal.  Mouth/Throat: Oropharynx is clear and moist. No oropharyngeal exudate.  Eyes: EOM are normal. Pupils are equal, round, and reactive to light. Right eye exhibits no discharge. Left eye exhibits no discharge. No scleral icterus.  Neck: No tracheal deviation present.  Lymphadenopathy:    He has no cervical adenopathy.  Skin: He is diaphoretic.             Assessment & Plan:   This encounter was created in error - please disregard.

## 2013-08-05 NOTE — Progress Notes (Signed)
Date: 08/04/2015  MRN:  [id number] Name:  Bill [name] Sex:  male Age:  67 y.o. DOB:1946-04-01                       Facility/Room;  [name] Level Of Care:  SNF  Emergency Contacts: Contact Information   Name Relation Home Work Mobile   Manning,Jaclyn L Daughter [phone]        Code Status:  DNR  Allergies: Allergies  Allergen Reactions  . Amoxil [Amoxicillin]     Per MAR  . Penicillins Other (See Comments)    unknown     Chief Complaint  Patient presents with  . Acute Visit   HPI: Patient seen this afternoon with Dr. Glade Lloyd, regarding an abscess noted along the left the buttock and the perineal area. The abscess was noted yesterday, 08/02/2013, the patient started on Avelox 400 mg a day. It was reported this morning, but the wound care nurse, at the wound draining copious amounts of purulent brown drainage. Reported that the patient's pain significantly decreased after drainage.  Past Medical History  Diagnosis Date  . COPD (chronic obstructive pulmonary disease)   . Diabetes mellitus without complication   . Hyperlipidemia   . Depression   . CHF (congestive heart failure)   . Gout   . OSA (obstructive sleep apnea)      History reviewed. No pertinent past surgical history.   Current Outpatient Prescriptions  Medication Sig Dispense Refill  . ciprofloxacin (CIPRO) 750 MG tablet Take 750 mg by mouth 2 (two) times daily.      . metroNIDAZOLE (FLAGYL) 500 MG tablet Take 500 mg by mouth 3 (three) times daily.      Marland Kitchen acetaminophen (TYLENOL) 325 MG tablet Take 650 mg by mouth every 4 (four) hours as needed for mild pain.      Marland Kitchen albuterol (PROVENTIL HFA;VENTOLIN HFA) 108 (90 BASE) MCG/ACT inhaler Inhale 1 puff into the lungs every 6 (six) hours as needed for wheezing or shortness of breath.      Marland Kitchen albuterol (PROVENTIL) (2.5 MG/3ML) 0.083% nebulizer solution Take 2.5 mg by nebulization every 6 (six) hours as needed for wheezing or shortness of breath.      .  allopurinol (ZYLOPRIM) 300 MG tablet Take 300 mg by mouth 2 (two) times daily.      Marland Kitchen amLODipine (NORVASC) 10 MG tablet Take 10 mg by mouth daily.      . budesonide-formoterol (SYMBICORT) 160-4.5 MCG/ACT inhaler Inhale 2 puffs into the lungs 2 (two) times daily.      . citalopram (CELEXA) 40 MG tablet Take 40 mg by mouth daily.      . clonazePAM (KLONOPIN) 2 MG tablet Take 2 mg by mouth 3 (three) times daily as needed for anxiety.      . ferrous sulfate 325 (65 FE) MG tablet Take 325 mg by mouth 3 (three) times daily with meals.      . furosemide (LASIX) 80 MG tablet Take 80 mg by mouth daily.      Marland Kitchen gabapentin (NEURONTIN) 100 MG capsule Take 100 mg by mouth 2 (two) times daily.      . insulin detemir (LEVEMIR) 100 UNIT/ML injection Inject 0.4 mLs (40 Units total) into the skin daily.  10 mL  12  . insulin lispro (HUMALOG) 100 UNIT/ML injection Inject 2-10 Units into the skin 4 (four) times daily -  before meals and at bedtime. Sliding scale      .  levofloxacin (LEVAQUIN) 750 MG tablet Take 1 tablet (750 mg total) by mouth daily.  8 tablet  0  . OLANZapine (ZYPREXA) 10 MG tablet Take 10 mg by mouth daily.      . pantoprazole (PROTONIX) 40 MG tablet Take 40 mg by mouth 2 (two) times daily.      . potassium chloride SA (K-DUR,KLOR-CON) 20 MEQ tablet Take 20 mEq by mouth daily.      . predniSONE (DELTASONE) 10 MG tablet Take 10 mg by mouth daily with breakfast.      . predniSONE (DELTASONE) 10 MG tablet Takes 6 tablets for 2 days, then 5 tablets for 2 days, then 4 tablets for 2 days, then 3 tablets for 2 days, then 2 tabs for 2 days, then 1 tab for 2 days, and then stop.  42 tablet  0  . primidone (MYSOLINE) 250 MG tablet Take 250 mg by mouth 2 (two) times daily.      . sitaGLIPtin-metformin (JANUMET) 50-500 MG per tablet Take 1 tablet by mouth 2 (two) times daily with a meal.      . tamsulosin (FLOMAX) 0.4 MG CAPS capsule Take 0.4 mg by mouth daily.      Marland Kitchen tiotropium (SPIRIVA) 18 MCG inhalation  capsule Place 18 mcg into inhaler and inhale daily.      . traMADol (ULTRAM) 50 MG tablet Take 1 tablet by mouth every 12 hours as needed for pain.  60 tablet  0  . zolpidem (AMBIEN) 10 MG tablet Take 10 mg by mouth at bedtime as needed for sleep.       No current facility-administered medications for this visit.    Immunization History  Administered Date(s) Administered  . Influenza Whole 06/16/2013  . PPD Test 06/16/2013  . Pneumococcal Conjugate-13 06/16/2013     Diet: No added salt Carbohydrate controlled  History  Substance Use Topics  . Smoking status: Former Smoker -- 1.50 packs/day for 30 years    Quit date: 08/31/2006  . Smokeless tobacco: Never Used  . Alcohol Use: No    Family History  Problem Relation Age of Onset  . Diabetes Mother   . Heart disease Mother     Review of Systems  Constitutional: Negative for fever.  HENT: Negative for ear discharge, ear pain, nosebleeds and tinnitus.   Eyes: Negative for double vision, photophobia and discharge.  Respiratory: Positive for shortness of breath. Negative for hemoptysis.   Cardiovascular: Negative for palpitations, orthopnea and claudication.  Gastrointestinal: Positive for constipation. Negative for diarrhea and blood in stool.  Genitourinary: Negative for hematuria and flank pain.       On 08/03/1999 a, at approximately 7 PM, the patient remarked he had not been able to urinate all day. A Foley catheter was inserted which drained 2000 cc of urine.  Musculoskeletal: Positive for falls.       08/01/2013 patient slid off his bed  Skin: Negative for itching and rash.  Neurological: Positive for tremors and weakness. Negative for seizures and loss of consciousness.  Endo/Heme/Allergies: Negative for environmental allergies. Bruises/bleeds easily.  Psychiatric/Behavioral: Positive for depression. Negative for suicidal ideas, hallucinations and substance abuse.   Lab from 08/02/2013 Sodium 140 Potassium  4.0 Chloride 96 CO2 33 AGAP 11 Glucose 97 BUN 30 Creatinine 1.2 Calcium 8.5  Lab from 07/31/2013 WBC 13.0 RBC 3.2 Hemoglobin 9.22 Hematocrit 32.8 MCV 85.2 MCH 23.9 MCHC 28 RDW 18.2 Platelets 267  Lab from 08/03/2013 WBC 16.2 RBC 4.2 Hemoglobin 10.4 Hematocrit 35.9 MCH 85.1  MCH 24.6 MCHC 29 RDW 19 Platelets 289 Neutrophil percentage 93.5  Vital signs: BP 137/70  Pulse 78  Temp(Src) 96.8 F (36 C)  Resp 20  SpO2 95%.  Physical Exam  Constitutional: No distress.  Patient is a morbidly obese Caucasian male, lying on his bed his respirations are unlabored and he is in no acute distress  HENT:  Head: Normocephalic and atraumatic.  Mouth/Throat: Oropharynx is clear and moist. No oropharyngeal exudate.  Eyes: Conjunctivae and EOM are normal. Pupils are equal, round, and reactive to light. Right eye exhibits no discharge. Left eye exhibits no discharge. No scleral icterus.  Neck: No tracheal deviation present. No thyromegaly present.  Cardiovascular: Exam reveals no gallop and no friction rub.   Apical pulse only intermittently irregular otherwise unremarkable.  Pulmonary/Chest: He has no rales. He exhibits no tenderness.  Bilateral breath sounds are essentially clear though modestly diminished  Abdominal: Bowel sounds are normal. He exhibits distension. He exhibits no mass. There is no rebound and no guarding.  Genitourinary:  At the perineal area, between the scrotum and the anus, is an indurated area consistent with an abscess. It is noted to be draining a moderately large amount of brown purulent drainage. The drainage is noted to be coming from several small openings.  The area of induration noted to be less than that palpated on 08/02/2013.  The area palpated would be approximately approximately 2 cm medial collateral and 3 cm vertically. By palpation, it is not possible to determine the depth of the abscess. Along the most medial aspect of the left buttock is  also a palpable area of induration. The area of induration is less by palpation and noted on 08/02/2013 with also decreased erythema. There appears to be no exudate from this buttock induration.  Musculoskeletal: He exhibits edema.  As of 08/02/2013, the patient's edema essentially resolved. Today, there is only a small area of edema involving the left tibial area at trace to 1+. Otherwise unremarkable.  Lymphadenopathy:    He has no cervical adenopathy.  Neurological: He is alert.  Skin: Skin is warm and dry.  Psychiatric: Judgment normal.   patient with long history of diabetes unfortunately, continues to eat concentrated carbohydrates and drink high sugar foods such as Coca[employer]. These food and drink items or evidence at bedside.      Assessment/Plan of 08/03/2013     Respiratory   Chronic respiratory failure   COPD exacerbation   COPD (chronic obstructive pulmonary disease)       for the patient's COPD it is imperative to continue his prednisone as per prescribed taper, tonight he is on 40 mg. We'll also continue his Proventil HFA inhaler, his Symbicort inhaler, and Spiriva            GERD we'll continue the patient's Protonix, especially since GERD could worsen his respiratory status      Endocrine   DM type 2 with diabetic peripheral neuropathy We'll continue the patient on his new basal insulin, Levemir and Janumet as well. However, the patient will need a CT scan of his perineal area and we will have to hold the Janumet until the study is complete.      Genitourinary   BPH (benign prostatic hyperplasia) the patient has been unable to void  A Foley catheter has been placed. We may consider a trial of urination, and later time, to determine if the Foley catheter can be removed. We'll also continue the gabapentin related to his neuropathy  AKI (acute kidney injury)  we will continue to carefully monitor his BMP and encourage oral intake.   EDEMA the edema has essentially  resolved, it is only minimal at this time. His breath sounds are dry, will continue to hold diuretic. We'll continue to monitor his BMP.      Other   Physical deconditioning  and encourage the patient to continue range of motion          Hypertension  we'll continue the patient's lower pain and monitor his blood pressure. Will implement other antihypertensives if necessary.     Gout we'll continue his allopurinol     Depression we'll certainly continue his Zyprexa and Celexa       Anemia of chronic disease  continue to monitor the patient's blood counts and continued supplementation is appropriate

## 2013-08-06 MED ORDER — MOXIFLOXACIN HCL 400 MG PO TABS: 400.0000 mg | ORAL_TABLET | Freq: Every day | ORAL | Status: DC

## 2013-08-06 NOTE — Progress Notes (Signed)
Date of Visit August 02, 2013  Patient ID: Bill [name], male   DOB: Jan 26, 1946, 67 y.o.   MRN: [id number]  Bill [name]  CODE STATUS: DO NOT RESUSCITATE  Chief Complaint  Patient presents with  . Acute Visit   HPI:  Asked to see patient today related to issue of skin breakdown, question of cellulitis, left buttock. Also related to ongoing need for Foley catheter, when Foley inserted last p.m., 2000 mL of urine drained.  Allergies  Allergen Reactions  . Amoxil [Amoxicillin]     Per MAR  . Penicillins Other (See Comments)    unknown   Current Outpatient Prescriptions on File Prior to Visit  Medication Sig Dispense Refill  . acetaminophen (TYLENOL) 325 MG tablet Take 650 mg by mouth every 4 (four) hours as needed for mild pain.      Marland Kitchen albuterol (PROVENTIL HFA;VENTOLIN HFA) 108 (90 BASE) MCG/ACT inhaler Inhale 1 puff into the lungs every 6 (six) hours as needed for wheezing or shortness of breath.      Marland Kitchen albuterol (PROVENTIL) (2.5 MG/3ML) 0.083% nebulizer solution Take 2.5 mg by nebulization every 6 (six) hours as needed for wheezing or shortness of breath.      . allopurinol (ZYLOPRIM) 300 MG tablet Take 300 mg by mouth 2 (two) times daily.      Marland Kitchen amLODipine (NORVASC) 10 MG tablet Take 10 mg by mouth daily.      . budesonide-formoterol (SYMBICORT) 160-4.5 MCG/ACT inhaler Inhale 2 puffs into the lungs 2 (two) times daily.      . citalopram (CELEXA) 40 MG tablet Take 40 mg by mouth daily.      . clonazePAM (KLONOPIN) 2 MG tablet Take 2 mg by mouth 3 (three) times daily as needed for anxiety.      . ferrous sulfate 325 (65 FE) MG tablet Take 325 mg by mouth 3 (three) times daily with meals.      . furosemide (LASIX) 80 MG tablet Take 80 mg by mouth daily.      Marland Kitchen gabapentin (NEURONTIN) 100 MG capsule Take 100 mg by mouth 2 (two) times daily.      . insulin detemir (LEVEMIR) 100 UNIT/ML injection Inject 0.4 mLs (40 Units total) into the skin daily.  10 mL  12  . insulin  lispro (HUMALOG) 100 UNIT/ML injection Inject 2-10 Units into the skin 4 (four) times daily -  before meals and at bedtime. Sliding scale      . OLANZapine (ZYPREXA) 10 MG tablet Take 10 mg by mouth daily.      . pantoprazole (PROTONIX) 40 MG tablet Take 40 mg by mouth 2 (two) times daily.      . potassium chloride SA (K-DUR,KLOR-CON) 20 MEQ tablet Take 20 mEq by mouth daily.      . predniSONE (DELTASONE) 10 MG tablet Takes 6 tablets for 2 days, then 5 tablets for 2 days, then 4 tablets for 2 days, then 3 tablets for 2 days, then 2 tabs for 2 days, then 1 tab for 2 days, and then stop.  42 tablet  0  . primidone (MYSOLINE) 250 MG tablet Take 250 mg by mouth 2 (two) times daily.      . sitaGLIPtin-metformin (JANUMET) 50-500 MG per tablet Take 1 tablet by mouth 2 (two) times daily with a meal.      . tamsulosin (FLOMAX) 0.4 MG CAPS capsule Take 0.4 mg by mouth daily.      Marland Kitchen  tiotropium (SPIRIVA) 18 MCG inhalation capsule Place 18 mcg into inhaler and inhale daily.      . traMADol (ULTRAM) 50 MG tablet Take 1 tablet by mouth every 12 hours as needed for pain.  60 tablet  0  . zolpidem (AMBIEN) 10 MG tablet Take 10 mg by mouth at bedtime as needed for sleep.       No current facility-administered medications on file prior to visit.   Past Medical History  Diagnosis Date  . COPD (chronic obstructive pulmonary disease)   . Diabetes mellitus without complication   . Hyperlipidemia   . Depression   . CHF (congestive heart failure)   . Gout   . OSA (obstructive sleep apnea)    No past surgical history on file.  Review of Systems  Constitutional: Negative for fever and chills.  HENT: Negative for ear discharge, hearing loss, nosebleeds and tinnitus.   Eyes: Negative for discharge and redness.  Respiratory: Negative for hemoptysis.   Cardiovascular: Negative for orthopnea, claudication and leg swelling.  Gastrointestinal: Positive for constipation. Negative for vomiting and diarrhea.    Genitourinary:       Positive for urinary retention, has required Foley catheter  Musculoskeletal: Positive for falls. Negative for neck pain.  Skin:       Complaint of pain in the perineal area. Complaint of swelling.  Neurological: Positive for weakness. Negative for seizures and loss of consciousness.  Endo/Heme/Allergies: Bruises/bleeds easily.  Psychiatric/Behavioral: Negative for suicidal ideas and hallucinations.    Relevant lab Urinalysis sent 08/01/2013 Color straw Glucose negative Bilirubin negative Ketones negative Specific gravity 1.025 Blood negative pH 6 Protein negative you are 00 0.2 Nitrates negative Leukocytes negative  Urine culture No growth  BP 112/68  Pulse 92  Temp(Src) 98.1 F (36.7 C)  Resp 20  SpO2 94%   Physical Exam  Constitutional: He is oriented to person, place, and time.  Patient is in no acute physiological compromise. He is obviously uncomfortable, especially with palpation of the affected perineal area. Patient is super morbidly obese.  HENT:  Right Ear: External ear normal.  Left Ear: External ear normal.  Mouth/Throat: Oropharynx is clear and moist. No oropharyngeal exudate.  Eyes: EOM are normal. Pupils are equal, round, and reactive to light. Right eye exhibits no discharge. Left eye exhibits no discharge. No scleral icterus.  Neck: No tracheal deviation present. No thyromegaly present.  Cardiovascular: Normal heart sounds and intact distal pulses.  Exam reveals no gallop and no friction rub.   Pulmonary/Chest: Effort normal. He has wheezes.  Only occasional wheezes. Bilateral breath sounds are essentially clear, even in lung bases.  Abdominal: Bowel sounds are normal. He exhibits distension.  Positive for some abdominal distention. No distinct abdominal tenderness.  Genitourinary: No discharge found.  Upon examination of the patient's perineal area at the base of the scrotum, is in clearly defined indurated area of  approximately at least 3 cm wide by 4 cm vertically. There is some scant brownish drainage. The area is very tender palpation. At the left buttock adjacent to the rectal area, is also an area of induration with some surrounding mild erythema not consistent with cellulitis. This area of induration is approximately 2-3 cm vertically and less than 1 cm horizontally. There is no distinct opening or visible exudate. It is tender to palpation.  Musculoskeletal: He exhibits no edema and no tenderness.  Lymphadenopathy:    He has no cervical adenopathy.  Neurological: He is alert and oriented to person, place, and  time.  Ongoing tremors of bilateral upper extremities, especially with movement.  Skin: No rash noted. There is erythema.  Positive for erythema at the buttock area , left side.  Psychiatric: Mood and affect normal.   Assessment/plan: Perineal abscess not truly involving the scrotum. Since this is a complicated skin infection in a diabetic, I will implement Avelox 400 mg once a day for 14 days. I will consult with Dr. Sander Radon on 08/03/2013.  Edema, continues to be resolved, will withhold Lasix at this time, will not discontinue potassium yet, will continue to monitor very carefully his BMP and electrolytes.  Urinary retention, fortunately the urinalysis and culture shows absolutely no indication of any infection. I am not inclined to put the patient on any urinary prophylaxis. Have encouraged the patient to drink as many noncaloric fluids as possible, especially water. There's certainly no renal contraindication. We'll consider cranberry supplementation. Continue the Flomax 0.4 mg at this time.  Hypertension/decreased blood pressure, the patient's blood pressure is responding nicely coming to 110 systolic, with better diastolic pressure. We'll continue the amlodipine 10 mg.  Will continue to hold the Lasix, unless edema recurs. If blood pressure shows an excessive increase, will add appropriate  medications as necessary. Beta blocker may be a very poor choice for this patient related to his severe COPD.  For the patient's COPD we'll continue the Proventil HFA inhaler, the albuterol 0.83% nebulizer solution as needed. We'll also continue the Symbicort 160/4.5 inhaler as a twice a day schedule of medication. We'll also continue the Spiriva 18 mcg as a once daily medication. Patient remains on a prednisone taper, will receive 40 mg tonight.  For the patient's poorly controlled diabetes, dietary cooperation on the patient's part would be very helpful. Unfortunately, he has an extensive amount of junk food available and high sugar foods such as Coca-Cola. Have asked the patient to please not drink the Coca-Cola. Levemir, 40 units at bedtime we'll continue. Also the Humalog sliding scale at 2-10 units at mealtime, based on capillary glucose will continue. May consider deferring the at bedtime dose of Humalog related to the patient's report of low a.m. blood sugars. We'll also continue the Janumet 50/500 as ordered.  Patient to be evaluated by dietary.  Anemia we'll continue his ferrous sulfate 325 mg and continue to monitor very carefully his blood counts.  Depression the patient's depression appears reasonably well-controlled on Celexa 40 mg and Zyprexa 10 mg.

## 2013-08-06 NOTE — Progress Notes (Signed)
Subjective:    Patient ID: Bill [name], male    DOB: 1946-07-12, 67 y.o.   MRN: [id number]  HPI               Progress Notes Bill [name] (MR# [id number])         Progress Notes Info    Author Note Status Last Update User Last Update Date/Time   Cari Caraway, FNP Signed Cari Caraway, FNP 08/06/2013 12:00 AM         Progress Notes          Date of Visit 08/01/2013  [name] [address]  Patient ID: Bill [name], male DOB: [birth year], 67 y.o. MRN: [id number]  CODE STATUS: DNR     Chief Complaint     Patient presents with     .  Acute Visit     Allergies     Allergen  Reactions     .  Amoxil [Amoxicillin]        Per MAR     .  Penicillins  Other (See Comments)       unknown     HPI  Patient being seen today related to report by nursing staff that his systolic blood pressures have been as low as 90 and his diastolic pressures in the 50s. Also in p.m., report by patient that he has not been able to urinate since the morning of 08/01/2013.  Review of Systems  Constitutional: Positive for diaphoresis and fatigue.  HENT: Negative for congestion, dental problem, drooling, ear discharge, ear pain, nosebleeds and tinnitus.  Eyes: Negative for photophobia, pain, discharge, redness and visual disturbance.  Respiratory: Positive for cough, shortness of breath and wheezing. Negative for choking.  Cardiovascular: Positive for palpitations and leg swelling.  Gastrointestinal: Negative for blood in stool and anal bleeding.  Endocrine: Negative for cold intolerance and heat intolerance.  Genitourinary: Positive for difficulty urinating. Negative for hematuria, discharge and genital sores.  Skin: Positive for pallor.  Allergic/Immunologic: Negative for environmental allergies and food allergies.  Neurological: Positive for tremors and weakness.  Hematological: Negative for adenopathy. Bruises/bleeds easily.  Psychiatric/Behavioral: Negative for suicidal ideas,  hallucinations, behavioral problems, self-injury and agitation. The patient is nervous/anxious.  Recent labs:  07/31/2013  WBC 13.1  RBC 3.9  Hemoglobin 9.2  Hematocrit 32.8  MCV 85.2  MCH 23.9  MCHC 28  RDW 18.2  Platelets 267  Hemoglobin A1c 7.7  Sodium 137  Potassium 3.9  Chloride 99  CO2 30  AGAP 9  Glucose 136  BUN 26  Creatinine 1  Calcium 8  Total protein 5.6  Albumin 2.7  Globulin 2.9  Total bilirubin 0.3  ALP 93  AST 19  ALT 28  BP 108/50  Pulse 118  Temp(Src) 98.4 F (36.9 C)  Resp 22  SpO2 95%           Physical Exam  Vitals reviewed.  Constitutional: He appears distressed.  Patient is a super morbidly obese Caucasian male who is obviously uncomfortable but in no acute physiological compromise.  HENT:  Head: Normocephalic and atraumatic.  Right Ear: External ear normal.  Left Ear: External ear normal.  Nose: Nose normal.  Mouth/Throat: No oropharyngeal exudate.  Eyes: EOM are normal. Pupils are equal, round, and reactive to light. Right eye exhibits no discharge. Left eye exhibits no discharge. No scleral icterus.  Neck: No JVD present. No tracheal deviation present. No thyromegaly present.  Cardiovascular: Intact distal pulses. Exam reveals no gallop and  no friction rub.  Tachycardic with a rate of 100-120, otherwise unremarkable.  Abdominal: Bowel sounds are normal. There is no tenderness. There is no guarding.  Musculoskeletal: He exhibits edema.  Patient with previous significant edema. Today, edema trace to 1+ at the medial ankles otherwise unremarkable.  Lymphadenopathy:  He has no cervical adenopathy.  Neurological: He is alert.  Coarse tremors involving upper extremities accentuated with movement.  Skin: Skin is warm. No rash noted. He is diaphoretic. No erythema.  Psychiatric: He has a normal mood and affect. His behavior is normal.  Concern that patient is very well aware he is a poorly controlled diabetic yet eats large amounts of  sweets, junk food, and sugar sweetened beverages.      Assessment & Plan:      New presence of hypotension and virtually no edema, will stop Lasix at this time. We'll continue examination to determine presence of edema. We will also repeat BMP to follow electrolyte balance and hydration status.  We'll continue the patient's other cardiac meds including Norvasc.  COPD, will continue history of a, Symbicort, and Proventil. The patient has Proventil available via inhaler and nebulizer.  The patient will receive 50 mg of prednisone tonight and then for the next 2 nights will receive 20 mg of prednisone, the prednisone will decrease by 10 mg every 2 nights. The patient likely will be maintained on a 10 mg prednisone tablet each day.  Diabetes, will continue patient's Levemir and Janumet, at 500/50. We'll also obtain a dietary consult to assist with better food choices as well as food choices.  Anemia we'll continue his supplements including ferrous sulfate  Gout we'll continue his allopurinol 300 mg  GERD we will certainly continue his Protonix, as untreated GERD could worsen his respiratory status.  The patient's depression will continue his Celexa at 40 mg, and his Zyprexa at 10 mg. Will continue to have Klonopin available 2 mg when necessary.  We'll continue his Mysoline 250 mg          Review of Systems     Objective:   Physical Exam        Assessment & Plan:   This encounter was created in error - please disregard.

## 2013-08-06 NOTE — Progress Notes (Signed)
Patient ID: Bill [name], male   DOB: 1946/05/28, 67 y.o.   MRN: [id number]   Bill [name]  08/01/2013   Nursing Home  MRN:  [id number]   Description: 67 year old male  Provider: Cari Caraway, FNP  Department: [facility]          Diagnoses      Blood pressure decreased    -  Primary      796.3      BPH (benign prostatic hyperplasia)          600.90      Physical deconditioning          799.3      Anemia of chronic disease          285.29      COPD (chronic obstructive pulmonary disease)          496             Reason for Visit      Acute Visit             Current Vitals - Last Recorded      BP Pulse Temp(Src) Resp SpO2        108/50 118 98.4 F (36.9 C) 22 95%                Progress Notes      Cari Caraway, FNP at 08/05/2013 11:29 PM      Status: Signed                    Date of Visit 08/01/2013 [name], room [address]    Patient ID: ROMMEL [name], male    DOB: 10-Jan-1946, 67 y.o.   MRN: [id number]   CODE STATUS: DNR    Chief Complaint   Patient presents with   .  Acute Visit         Allergies   Allergen  Reactions   .  Amoxil [Amoxicillin]         Per MAR   .  Penicillins  Other (See Comments)       unknown      HPI Patient being seen today related to report by nursing staff that his systolic blood pressures have been as low as 90 and his diastolic pressures in the 50s. Also in p.m., report by patient that he has not been able to urinate since the morning of 08/01/2013.     Review of Systems  Constitutional: Positive for diaphoresis and fatigue.  HENT: Negative for congestion, dental problem, drooling, ear discharge, ear pain, nosebleeds and tinnitus.   Eyes: Negative for photophobia, pain, discharge, redness and visual disturbance.  Respiratory: Positive for cough, shortness of breath and wheezing. Negative for choking.  Cardiovascular: Positive for palpitations and leg swelling.  Gastrointestinal: Negative for blood in stool  and anal bleeding.  Endocrine: Negative for cold intolerance and heat intolerance.  Genitourinary: Positive for difficulty urinating. Negative for hematuria, discharge and genital sores.  Skin: Positive for pallor.  Allergic/Immunologic: Negative for environmental allergies and food allergies.  Neurological: Positive for tremors and weakness.  Hematological: Negative for adenopathy. Bruises/bleeds easily.  Psychiatric/Behavioral: Negative for suicidal ideas, hallucinations, behavioral problems, self-injury and agitation. The patient is nervous/anxious.     Recent labs: 07/31/2013 WBC 13.1 RBC 3.9 Hemoglobin 9.2 Hematocrit 32.8 MCV 85.2 MCH 23.9 MCHC 28 RDW 18.2 Platelets 267   Hemoglobin A1c 7.7   Sodium 137 Potassium 3.9 Chloride 99 CO2 30 AGAP  9 Glucose 136 BUN 26 Creatinine 1 Calcium 8 Total protein 5.6 Albumin 2.7 Globulin 2.9 Total bilirubin 0.3 ALP 93 AST 19 ALT 28   BP 108/50  Pulse 118  Temp(Src) 98.4 F (36.9 C)  Resp 22  SpO2 95%            Physical Exam  Vitals reviewed. Constitutional: He appears distressed.  Patient is a super morbidly obese Caucasian male who is obviously uncomfortable but in no acute physiological compromise.  HENT:   Head: Normocephalic and atraumatic.  Right Ear: External ear normal.  Left Ear: External ear normal.   Nose: Nose normal.   Mouth/Throat: No oropharyngeal exudate.  Eyes: EOM are normal. Pupils are equal, round, and reactive to light. Right eye exhibits no discharge. Left eye exhibits no discharge. No scleral icterus.  Neck: No JVD present. No tracheal deviation present. No thyromegaly present.  Cardiovascular: Intact distal pulses.  Exam reveals no gallop and no friction rub.   Tachycardic with a rate of 100-120, otherwise unremarkable.  Abdominal: Bowel sounds are normal. There is no tenderness. There is no guarding.  Presence of supra pubic tenderness and distention, foley catheter inserted and 2000  ml urine drained. Musculoskeletal: He exhibits edema.  Patient with previous significant edema. Today, edema trace to 1+ at the medial ankles otherwise unremarkable.  Lymphadenopathy:    He has no cervical adenopathy.  Neurological: He is alert.  Coarse tremors involving upper extremities accentuated with movement.  Skin: Skin is warm. No rash noted. He is diaphoretic. No erythema.  Psychiatric: He has a normal mood and affect. His behavior is normal.  Concern that patient is very well aware he is a poorly controlled diabetic yet eats large amounts of sweets, junk food, and sugar sweetened beverages.        Assessment & Plan:   Will continue the foley catheter and send a urine for C&S.  ? If retention is likely due to his BPH.   New presence of hypotension and virtually no edema, will stop Lasix at this time. We'll continue examination to determine presence of edema. We will also repeat BMP to follow electrolyte balance and hydration status. We'll continue the patient's other cardiac meds including Norvasc at 10mg . COPD, will continue history of a, Symbicort, and Proventil. The patient has Proventil available via inhaler and nebulizer. The patient will receive 50 mg of prednisone tonight and then for the next 2 nights will receive 20 mg of prednisone, the prednisone will decrease by 10 mg every 2 nights. The patient likely will be maintained on a 10 mg prednisone tablet each day. Diabetes, will continue patient's Levemir  and Janumet, at 500/50. We'll also obtain a dietary consult to assist with better food choices as well as food choices. Anemia we'll continue his supplements including ferrous sulfate Gout we'll continue his allopurinol 300 mg GERD we will certainly continue his Protonix, as untreated GERD could worsen his respiratory status. The patient's depression will continue his Celexa at 40 mg, and his Zyprexa at 10 mg. Will continue to have Klonopin available 2 mg when  necessary.   We'll continue his Mysoline 250 mg         Level of Service      [facility] NURSING FACIL CARE/DAY NEW PROBLEM 25 MIN [phone]

## 2013-08-07 ENCOUNTER — Other Ambulatory Visit: Payer: Self-pay | Admitting: *Deleted

## 2013-08-07 ENCOUNTER — Ambulatory Visit (HOSPITAL_COMMUNITY): Payer: Medicare Other

## 2013-08-07 MED ORDER — CLONAZEPAM 2 MG PO TABS: ORAL_TABLET | ORAL | Status: DC

## 2013-08-07 NOTE — Addendum Note (Signed)
Addended by: Zachery Dauer on: 08/07/2013 03:44 PM   Modules accepted: Kipp Brood

## 2013-08-07 NOTE — Progress Notes (Signed)
This encounter was created in error - please disregard.

## 2013-08-10 ENCOUNTER — Encounter: Payer: Self-pay | Admitting: Nurse Practitioner

## 2013-08-10 ENCOUNTER — Ambulatory Visit (HOSPITAL_COMMUNITY): Payer: Medicare Other

## 2013-08-10 ENCOUNTER — Ambulatory Visit (HOSPITAL_COMMUNITY)
Admission: RE | Admit: 2013-08-10 | Discharge: 2013-08-10 | Disposition: A | Discharge status: Home / Self Care | Payer: Medicare Other | Source: Ambulatory Visit | Attending: Internal Medicine | Admitting: Internal Medicine

## 2013-08-10 ENCOUNTER — Non-Acute Institutional Stay (SKILLED_NURSING_FACILITY): Payer: Medicare Other | Admitting: Nurse Practitioner

## 2013-08-10 ENCOUNTER — Other Ambulatory Visit: Payer: Self-pay | Admitting: Internal Medicine

## 2013-08-10 ENCOUNTER — Encounter (HOSPITAL_COMMUNITY): Payer: Self-pay

## 2013-08-10 DIAGNOSIS — K651 Peritoneal abscess: Secondary | ICD-10-CM

## 2013-08-10 DIAGNOSIS — R031 Nonspecific low blood-pressure reading: Secondary | ICD-10-CM

## 2013-08-10 DIAGNOSIS — L0231 Cutaneous abscess of buttock: Secondary | ICD-10-CM

## 2013-08-10 DIAGNOSIS — L02215 Cutaneous abscess of perineum: Secondary | ICD-10-CM

## 2013-08-10 DIAGNOSIS — L02219 Cutaneous abscess of trunk, unspecified: Secondary | ICD-10-CM

## 2013-08-10 DIAGNOSIS — N498 Inflammatory disorders of other specified male genital organs: Secondary | ICD-10-CM | POA: Insufficient documentation

## 2013-08-10 MED ORDER — IOHEXOL 300 MG/ML  SOLN
100.0000 mL | Freq: Once | INTRAMUSCULAR | Status: AC | PRN
  Administered 2013-08-10: 100 mL via INTRAVENOUS

## 2013-08-10 NOTE — Progress Notes (Signed)
Patient ID: Bill [name], male   DOB: 1946-02-24, 67 y.o.   MRN: [id number]     Date of Visit August 02, 2013  Patient ID: Bill [name], male   DOB: Nov 18, 1945, 67 y.o.   MRN: [id number]  Bill [name] [address] Rm 207  CODE STATUS: DO NOT RESUSCITATE    Chief Complaint   Patient presents with   .  Acute Visit    HPI:  Asked to see patient today related to issue of skin breakdown, question of cellulitis, left buttock. Also related to ongoing need for Foley catheter, when Foley inserted last p.m., 2000 mL of urine drained.    Allergies   Allergen  Reactions   .  Amoxil [Amoxicillin]         Per MAR   .  Penicillins  Other (See Comments)       unknown      Current Outpatient Prescriptions on File Prior to Visit   Medication  Sig  Dispense  Refill   .  acetaminophen (TYLENOL) 325 MG tablet  Take 650 mg by mouth every 4 (four) hours as needed for mild pain.         Marland Kitchen  albuterol (PROVENTIL HFA;VENTOLIN HFA) 108 (90 BASE) MCG/ACT inhaler  Inhale 1 puff into the lungs every 6 (six) hours as needed for wheezing or shortness of breath.         Marland Kitchen  albuterol (PROVENTIL) (2.5 MG/3ML) 0.083% nebulizer solution  Take 2.5 mg by nebulization every 6 (six) hours as needed for wheezing or shortness of breath.         .  allopurinol (ZYLOPRIM) 300 MG tablet  Take 300 mg by mouth 2 (two) times daily.         Marland Kitchen  amLODipine (NORVASC) 10 MG tablet  Take 10 mg by mouth daily.         .  budesonide-formoterol (SYMBICORT) 160-4.5 MCG/ACT inhaler  Inhale 2 puffs into the lungs 2 (two) times daily.         .  citalopram (CELEXA) 40 MG tablet  Take 40 mg by mouth daily.         .  clonazePAM (KLONOPIN) 2 MG tablet  Take 2 mg by mouth 3 (three) times daily as needed for anxiety.         .  ferrous sulfate 325 (65 FE) MG tablet  Take 325 mg by mouth 3 (three) times daily with meals.         .  furosemide (LASIX) 80 MG tablet  Take 80 mg by mouth daily.         Marland Kitchen  gabapentin (NEURONTIN) 100 MG capsule  Take 100  mg by mouth 2 (two) times daily.         .  insulin detemir (LEVEMIR) 100 UNIT/ML injection  Inject 0.4 mLs (40 Units total) into the skin daily.   10 mL   12   .  insulin lispro (HUMALOG) 100 UNIT/ML injection  Inject 2-10 Units into the skin 4 (four) times daily -  before meals and at bedtime. Sliding scale         .  OLANZapine (ZYPREXA) 10 MG tablet  Take 10 mg by mouth daily.         .  pantoprazole (PROTONIX) 40 MG tablet  Take 40 mg by mouth 2 (two) times daily.         .  potassium chloride SA (K-DUR,KLOR-CON) 20 MEQ  tablet  Take 20 mEq by mouth daily.         .  predniSONE (DELTASONE) 10 MG tablet  Takes 6 tablets for 2 days, then 5 tablets for 2 days, then 4 tablets for 2 days, then 3 tablets for 2 days, then 2 tabs for 2 days, then 1 tab for 2 days, and then stop.   42 tablet   0   .  primidone (MYSOLINE) 250 MG tablet  Take 250 mg by mouth 2 (two) times daily.         .  sitaGLIPtin-metformin (JANUMET) 50-500 MG per tablet  Take 1 tablet by mouth 2 (two) times daily with a meal.         .  tamsulosin (FLOMAX) 0.4 MG CAPS capsule  Take 0.4 mg by mouth daily.         Marland Kitchen  tiotropium (SPIRIVA) 18 MCG inhalation capsule  Place 18 mcg into inhaler and inhale daily.         .  traMADol (ULTRAM) 50 MG tablet  Take 1 tablet by mouth every 12 hours as needed for pain.   60 tablet   0   .  zolpidem (AMBIEN) 10 MG tablet  Take 10 mg by mouth at bedtime as needed for sleep.            No current facility-administered medications on file prior to visit.      Past Medical History   Diagnosis  Date   .  COPD (chronic obstructive pulmonary disease)     .  Diabetes mellitus without complication     .  Hyperlipidemia     .  Depression     .  CHF (congestive heart failure)     .  Gout     .  OSA (obstructive sleep apnea)      No past surgical history on file.  Review of Systems  Constitutional: Negative for fever and chills.  HENT: Negative for ear discharge, hearing loss, nosebleeds and  tinnitus.   Eyes: Negative for discharge and redness.  Respiratory: Negative for hemoptysis.   Cardiovascular: Negative for orthopnea, claudication and leg swelling.  Gastrointestinal: Positive for constipation. Negative for vomiting and diarrhea.  Genitourinary:        Positive for urinary retention, has required Foley catheter  Musculoskeletal: Positive for falls. Negative for neck pain.  Skin:        Complaint of pain in the perineal area. Complaint of swelling.  Neurological: Positive for weakness. Negative for seizures and loss of consciousness.  Endo/Heme/Allergies: Bruises/bleeds easily.  Psychiatric/Behavioral: Negative for suicidal ideas and hallucinations.    Relevant lab Urinalysis sent 08/01/2013 Color straw Glucose negative Bilirubin negative Ketones negative Specific gravity 1.025 Blood negative pH 6 Protein negative you are 00 0.2 Nitrates negative Leukocytes negative  Urine culture No growth  BP 112/68  Pulse 92  Temp(Src) 98.1 F (36.7 C)  Resp 20  SpO2 94%   Physical Exam  Constitutional: He is oriented to person, place, and time.  Patient is in no acute physiological compromise. He is obviously uncomfortable, especially with palpation of the affected perineal area. Patient is super morbidly obese.  HENT:  Right Ear: External ear normal.  Left Ear: External ear normal.   Mouth/Throat: Oropharynx is clear and moist. No oropharyngeal exudate.  Eyes: EOM are normal. Pupils are equal, round, and reactive to light. Right eye exhibits no discharge. Left eye exhibits no discharge. No scleral icterus.  Neck: No  tracheal deviation present. No thyromegaly present.  Cardiovascular: Normal heart sounds and intact distal pulses.  Exam reveals no gallop and no friction rub.   Pulmonary/Chest: Effort normal. He has wheezes.  Only occasional wheezes. Bilateral breath sounds are essentially clear, even in lung bases.  Abdominal: Bowel sounds are normal. He exhibits  distension.  Positive for some abdominal distention. No distinct abdominal tenderness.  Genitourinary: No discharge found.  Upon examination of the patient's perineal area at the base of the scrotum, is in clearly defined indurated area of approximately at least 3 cm wide by 4 cm vertically. There is some scant brownish drainage. The area is very tender palpation. At the left buttock adjacent to the rectal area, is also an area of induration with some surrounding mild erythema not consistent with cellulitis. This area of induration is approximately 2-3 cm vertically and less than 1 cm horizontally. There is no distinct opening or visible exudate. It is tender to palpation.  Musculoskeletal: He exhibits no edema and no tenderness.  Lymphadenopathy:    He has no cervical adenopathy.  Neurological: He is alert and oriented to person, place, and time.  Ongoing tremors of bilateral upper extremities, especially with movement.  Skin: No rash noted. There is erythema.  Positive for erythema at the buttock area , left side.  Psychiatric: Mood and affect normal.   Assessment/plan: Perineal abscess not truly involving the scrotum. Since this is a complicated skin infection in a diabetic, I will implement Avelox 400 mg once a day for 14 days. I will consult with Dr. Sander Radon on 08/03/2013.  Edema, continues to be resolved, will withhold Lasix at this time, will not discontinue potassium yet, will continue to monitor very carefully his BMP and electrolytes.  Urinary retention, fortunately the urinalysis and culture shows absolutely no indication of any infection. I am not inclined to put the patient on any urinary prophylaxis. Have encouraged the patient to drink as many noncaloric fluids as possible, especially water. There's certainly no renal contraindication. We'll consider cranberry supplementation. Continue the Flomax 0.4 mg at this time.  Hypertension/decreased blood pressure, the patient's blood  pressure is responding nicely coming to 110 systolic, with better diastolic pressure. We'll continue the amlodipine 10 mg.  Will continue to hold the Lasix, unless edema recurs. If blood pressure shows an excessive increase, will add appropriate medications as necessary. Beta blocker may be a very poor choice for this patient related to his severe COPD.  For the patient's COPD we'll continue the Proventil HFA inhaler, the albuterol 0.83% nebulizer solution as needed. We'll also continue the Symbicort 160/4.5 inhaler as a twice a day schedule of medication. We'll also continue the Spiriva 18 mcg as a once daily medication. Patient remains on a prednisone taper, will receive 40 mg tonight.  For the patient's poorly controlled diabetes, dietary cooperation on the patient's part would be very helpful. Unfortunately, he has an extensive amount of junk food available and high sugar foods such as Coca-Cola. Have asked the patient to please not drink the Coca-Cola. Levemir, 40 units at bedtime we'll continue. Also the Humalog sliding scale at 2-10 units at mealtime, based on capillary glucose will continue. May consider deferring the at bedtime dose of Humalog related to the patient's report of low a.m. blood sugars. We'll also continue the Janumet 50/500 as ordered.  Patient to be evaluated by dietary.  Anemia we'll continue his ferrous sulfate 325 mg and continue to monitor very carefully his blood counts.  Depression the  patient's depression appears reasonably well-controlled on Celexa 40 mg and Zyprexa 10 mg.                    Eye Exams   Not recorded  Retinopathy of Prematurity Exam Visit: Transcription V2 (Rich Text) Amb Meds Ordered For Enc W/pt Consult Msg   Medications Ordered This Encounter        Disp Refills Start End      moxifloxacin (AVELOX) 400 MG tablet 14 tablet 0 08/02/2013 08/16/2013      Take 1 tablet (400 mg total) by mouth daily. - Oral          Pending  Medications Discontinued Medications as of Encounter   Discontinued Medications        Reason for Discontinue      furosemide (LASIX) 80 MG tablet Discontinued by provider       ADDENDUM:  Pt seen with Dr. Glade Lloyd on December 4th.  Per her order, antibiotic changed to Cipro 750 mg twice a day and Flagyl 500 mg three times a day. Plan therapy for 14 days. CT scan, of the pelvis, with and without contrast to be done as possible.

## 2013-08-10 NOTE — Progress Notes (Signed)
This encounter was created in error - please disregard.

## 2013-08-10 NOTE — Addendum Note (Signed)
Addended by: Zachery Dauer on: 08/10/2013 06:03 PM   Modules accepted: Level of Service, SmartSet

## 2013-08-10 NOTE — Progress Notes (Signed)
Patient in waiting room, complaining of dizziness and wanting to lie down. Brought into nurses station and helped onto stretcher. Feels better once lying down. BP 143/63 HR 87 RR 24, Sp02 95% on 2L [place]. CBG 267.  Feels back to his pre dizzy state now. Has been on bedrest at the nursing home since 06/12/13. Awaiting CT scan.

## 2013-08-10 NOTE — Progress Notes (Signed)
Patient ID: Bill [name], male   DOB: October 01, 1945, 67 y.o.   MRN: [id number]  Date of Visit:  12/11/20414  Code Status:  DNR  Chief Complaint  Patient presents with  . Acute Visit   Allergies  Allergen Reactions  . Amoxil [Amoxicillin]     Per MAR  . Penicillins Other (See Comments)    unknown   Current Outpatient Prescriptions on File Prior to Visit  Medication Sig Dispense Refill  . acetaminophen (TYLENOL) 325 MG tablet Take 650 mg by mouth every 4 (four) hours as needed for mild pain.      Marland Kitchen albuterol (PROVENTIL HFA;VENTOLIN HFA) 108 (90 BASE) MCG/ACT inhaler Inhale 1 puff into the lungs every 6 (six) hours as needed for wheezing or shortness of breath.      Marland Kitchen albuterol (PROVENTIL) (2.5 MG/3ML) 0.083% nebulizer solution Take 2.5 mg by nebulization every 6 (six) hours as needed for wheezing or shortness of breath.      . allopurinol (ZYLOPRIM) 300 MG tablet Take 300 mg by mouth 2 (two) times daily.      Marland Kitchen amLODipine (NORVASC) 10 MG tablet Take 10 mg by mouth daily.      . budesonide-formoterol (SYMBICORT) 160-4.5 MCG/ACT inhaler Inhale 2 puffs into the lungs 2 (two) times daily.      . citalopram (CELEXA) 40 MG tablet Take 40 mg by mouth daily.      . clonazePAM (KLONOPIN) 2 MG tablet Take three tablets by mouth every evening as needed for severe anxiety  90 tablet  5  . ferrous sulfate 325 (65 FE) MG tablet Take 325 mg by mouth 3 (three) times daily with meals.      . gabapentin (NEURONTIN) 100 MG capsule Take 100 mg by mouth 2 (two) times daily.      . insulin detemir (LEVEMIR) 100 UNIT/ML injection Inject 0.4 mLs (40 Units total) into the skin daily.  10 mL  12  . insulin lispro (HUMALOG) 100 UNIT/ML injection Inject 2-10 Units into the skin 4 (four) times daily -  before meals and at bedtime. Sliding scale      . OLANZapine (ZYPREXA) 10 MG tablet Take 10 mg by mouth daily.      . pantoprazole (PROTONIX) 40 MG tablet Take 40 mg by mouth 2 (two) times daily.      . potassium  chloride SA (K-DUR,KLOR-CON) 20 MEQ tablet Take 20 mEq by mouth daily.      . predniSONE (DELTASONE) 10 MG tablet Takes 6 tablets for 2 days, then 5 tablets for 2 days, then 4 tablets for 2 days, then 3 tablets for 2 days, then 2 tabs for 2 days, then 1 tab for 2 days, and then stop.  42 tablet  0  . primidone (MYSOLINE) 250 MG tablet Take 250 mg by mouth 2 (two) times daily.      . sitaGLIPtin-metformin (JANUMET) 50-500 MG per tablet Take 1 tablet by mouth 2 (two) times daily with a meal.      . tamsulosin (FLOMAX) 0.4 MG CAPS capsule Take 0.4 mg by mouth daily.      Marland Kitchen tiotropium (SPIRIVA) 18 MCG inhalation capsule Place 18 mcg into inhaler and inhale daily.      . traMADol (ULTRAM) 50 MG tablet Take 1 tablet by mouth every 12 hours as needed for pain.  60 tablet  0  . zolpidem (AMBIEN) 10 MG tablet Take 10 mg by mouth at bedtime as needed for sleep.  No current facility-administered medications on file prior to visit.   Meds started on 08/03/2013 include: Cipro 750 mg one twice a day x14 days Flagyl 500 mg one by mouth every 8 hours x14 days Both these medicines to address the patient's perineal and buttock abscesses  Past Medical History  Diagnosis Date  . COPD (chronic obstructive pulmonary disease)   . Diabetes mellitus without complication   . Hyperlipidemia   . Depression   . CHF (congestive heart failure)   . Gout   . OSA (obstructive sleep apnea)    History reviewed. No pertinent past surgical history.  HPI:  Pt with perineal abscess, and L buttock abscess. Has been Flagyl and Cipro since 08/03/13.  Opening on perineal abscess, now virtually closed.Pt states he is feeling much better and has significantly less discomfort.  Review of Systems  Constitutional: Negative for fever and chills.  HENT: Negative for ear discharge and nosebleeds.   Eyes: Negative for discharge and redness.  Respiratory: Negative for hemoptysis and sputum production.   Cardiovascular: Negative  for chest pain and leg swelling.  Gastrointestinal: Negative for diarrhea, constipation and blood in stool.       Pt reports large BM within last 48 hours.    Genitourinary: Negative for hematuria and flank pain.  Musculoskeletal: Positive for falls.       On 08/09/2013, the patient fell while transferring from wheelchair to bed  Skin:       2 abscesses  Neurological: Negative for seizures and loss of consciousness.  Endo/Heme/Allergies: Bruises/bleeds easily.  Psychiatric/Behavioral: Negative for suicidal ideas, hallucinations and substance abuse.    BP 128/76  Pulse 76  Temp(Src) 97.6 F (36.4 C)  Resp 18  SpO2 96%  Physical Exam  Constitutional: No distress.  Cardiovascular: Normal heart sounds and intact distal pulses.   No edema is present.  Pulmonary/Chest: No respiratory distress. He has no wheezes. He has no rales.  Abdominal: Soft. Bowel sounds are normal.  Skin: Skin is warm and dry. He is not diaphoretic.  Left buttock, erythema is almost completely resolved, there is no increased warmth.. Area of induration is significantly decreased though, there is an area clearly palpable approximately 4 cm vertically and 1/2-2 cm horizontally. It is not clearly fluctuant.  Perineal abscess, opening, which allowed passage of purulent drainage is now closed. After thoroughly cleaned with Betadine a small incision approximately, a 0. 75 cm, made  with the expression of a small amount of purulent drainage and some bloody drainage. No odor, the area of induration is significantly decreased. There is no increased warmth, it is much less tender to palpation.  Psychiatric: He has a normal mood and affect. His behavior is normal.   Pt seen this afternoon after returning from scan, states he feels very well, interacting with Pt staff.  1. Perineal abscess   2. Cellulitis and abscess of buttock   3. Blood pressure decreased    Plan:   A very small incision, to facilitate drainage,  was  made directly over the area previously open on the perineal abscess Will continue the sliver alginate and ABD pad Pt will complete antibiotics CT scan this afternoon, CT results may indicate need for surgical referral

## 2013-08-11 ENCOUNTER — Encounter: Payer: Self-pay | Admitting: Adult Health

## 2013-08-11 ENCOUNTER — Non-Acute Institutional Stay (SKILLED_NURSING_FACILITY): Payer: Medicare Other | Admitting: Adult Health

## 2013-08-11 ENCOUNTER — Non-Acute Institutional Stay: Payer: Medicare Other | Admitting: Nurse Practitioner

## 2013-08-11 DIAGNOSIS — E1149 Type 2 diabetes mellitus with other diabetic neurological complication: Secondary | ICD-10-CM

## 2013-08-11 DIAGNOSIS — E1142 Type 2 diabetes mellitus with diabetic polyneuropathy: Secondary | ICD-10-CM

## 2013-08-11 DIAGNOSIS — K612 Anorectal abscess: Secondary | ICD-10-CM

## 2013-08-11 DIAGNOSIS — L02215 Cutaneous abscess of perineum: Secondary | ICD-10-CM

## 2013-08-11 DIAGNOSIS — R338 Other retention of urine: Secondary | ICD-10-CM

## 2013-08-11 DIAGNOSIS — R339 Retention of urine, unspecified: Secondary | ICD-10-CM | POA: Insufficient documentation

## 2013-08-11 DIAGNOSIS — K610 Anal abscess: Secondary | ICD-10-CM | POA: Insufficient documentation

## 2013-08-11 DIAGNOSIS — I509 Heart failure, unspecified: Secondary | ICD-10-CM

## 2013-08-11 DIAGNOSIS — R031 Nonspecific low blood-pressure reading: Secondary | ICD-10-CM

## 2013-08-11 HISTORY — DX: Retention of urine, unspecified: R33.9

## 2013-08-11 NOTE — Progress Notes (Signed)
Patient ID: Bill [name], male   DOB: [birth year], 67 y.o.   MRN: [id number]      [name]  Allergies  Allergen Reactions  . Amoxil [Amoxicillin]     Per MAR  . Penicillins Other (See Comments)    unknown    Chief Complaint  Patient presents with  . Acute Visit    follow up wounds.     HPI  He is being seen for the follow up to to his perianal abscess. The area is red hard and slightly warm. There is no pain to palpation. He is present on abt of cipro and flagyl for the area. His testicular area is swollen as well. He remains with a foley at this time.    Past Medical History  Diagnosis Date  . COPD (chronic obstructive pulmonary disease)   . Diabetes mellitus without complication   . Hyperlipidemia   . Depression   . CHF (congestive heart failure)   . Gout   . OSA (obstructive sleep apnea)     No past surgical history on file.  VITAL SIGNS BP 112/56  Pulse 90  Ht 5\' 11"  (1.803 m)  Wt 263 lb (119.296 kg)  BMI 36.70 kg/m2   Patient's Medications  New Prescriptions   No medications on file  Previous Medications   ACETAMINOPHEN (TYLENOL) 325 MG TABLET    Take 650 mg by mouth every 4 (four) hours as needed for mild pain.   ALBUTEROL (PROVENTIL HFA;VENTOLIN HFA) 108 (90 BASE) MCG/ACT INHALER    Inhale 2 puffs into the lungs every 6 (six) hours as needed for wheezing or shortness of breath.    ALBUTEROL (PROVENTIL) (2.5 MG/3ML) 0.083% NEBULIZER SOLUTION    Take 2.5 mg by nebulization 2 (two) times daily as needed for wheezing or shortness of breath.    ALLOPURINOL (ZYLOPRIM) 300 MG TABLET    Take 300 mg by mouth 2 (two) times daily.   AMLODIPINE (NORVASC) 10 MG TABLET    Take 10 mg by mouth daily.   BUDESONIDE-FORMOTEROL (SYMBICORT) 160-4.5 MCG/ACT INHALER    Inhale 2 puffs into the lungs 2 (two) times daily.   CIPROFLOXACIN (CIPRO) 750 MG TABLET    Take 750 mg by mouth 2 (two) times daily.   CITALOPRAM (CELEXA) 40 MG TABLET    Take 40 mg by mouth daily.   CRANBERRY 250 MG CAPS    Take 1 capsule by mouth 2 (two) times daily.   FERROUS SULFATE 325 (65 FE) MG TABLET    Take 325 mg by mouth 3 (three) times daily with meals.   GABAPENTIN (NEURONTIN) 100 MG CAPSULE    Take 100 mg by mouth 2 (two) times daily.   INSULIN DETEMIR (LEVEMIR) 100 UNIT/ML INJECTION    Inject 0.4 mLs (40 Units total) into the skin daily.   INSULIN LISPRO (HUMALOG) 100 UNIT/ML INJECTION    Inject 2-10 Units into the skin 4 (four) times daily -  before meals and at bedtime. Sliding scale   METRONIDAZOLE (FLAGYL) 500 MG TABLET    Take 500 mg by mouth 3 (three) times daily.   OLANZAPINE (ZYPREXA) 10 MG TABLET    Take 10 mg by mouth daily.   PANTOPRAZOLE (PROTONIX) 40 MG TABLET    Take 40 mg by mouth 2 (two) times daily.   POLYETHYLENE GLYCOL (MIRALAX / GLYCOLAX) PACKET    Take 17 g by mouth daily.   POTASSIUM CHLORIDE SA (K-DUR,KLOR-CON) 20 MEQ TABLET    Take 20 mEq  by mouth daily.   PRIMIDONE (MYSOLINE) 250 MG TABLET    Take 250 mg by mouth 2 (two) times daily.   SENNOSIDES-DOCUSATE SODIUM (SENOKOT-S) 8.6-50 MG TABLET    Take 1 tablet by mouth 2 (two) times daily.   SITAGLIPTIN-METFORMIN (JANUMET) 50-500 MG PER TABLET    Take 1 tablet by mouth 2 (two) times daily with a meal.   TAMSULOSIN (FLOMAX) 0.4 MG CAPS CAPSULE    Take 0.4 mg by mouth daily.   TIOTROPIUM (SPIRIVA) 18 MCG INHALATION CAPSULE    Place 18 mcg into inhaler and inhale daily.   TRAMADOL (ULTRAM) 50 MG TABLET    Take 1 tablet by mouth every 12 hours as needed for pain.   ZOLPIDEM (AMBIEN) 10 MG TABLET    Take 10 mg by mouth at bedtime as needed for sleep.  Modified Medications   Modified Medication Previous Medication   CLONAZEPAM (KLONOPIN) 2 MG TABLET clonazePAM (KLONOPIN) 2 MG tablet      Take 3 mg by mouth at bedtime as needed. Take three tablets by mouth every evening as needed for severe anxiety    Take three tablets by mouth every evening as needed for severe anxiety   PREDNISONE (DELTASONE) 10 MG TABLET  predniSONE (DELTASONE) 10 MG tablet      Take 10 mg by mouth daily with breakfast.    Takes 6 tablets for 2 days, then 5 tablets for 2 days, then 4 tablets for 2 days, then 3 tablets for 2 days, then 2 tabs for 2 days, then 1 tab for 2 days, and then stop.  Discontinued Medications   MOXIFLOXACIN (AVELOX) 400 MG TABLET    Take 1 tablet (400 mg total) by mouth daily.    SIGNIFICANT STUDIES  07-26-13: chest x-ray: minimal bibasilar pneumonitis with superimposed mild congestive changes.   08-04-13: kub: bowel gas pattern could be secondary to partial bowel obstruction versus moderate to severe ileus. No abnormal calcifications in the region of the abdomen   08-10-13: ct of pelvis: 4.1 x 2.4 cm left perineal/scrotal abscess. Associated foci of soft tissue gas, correlate for recent intervention.  Additional 4.3 x 2.3 cm left gluteal fluid collection, suspicious  for early/developing abscess.  Associated mild subcutaneous stranding in the left gluteal region,  likely reflecting cellulitis.  Mildly thick-walled bladder, correlate for cystitis.   LAS REVIEWED:   07-10-13: glucose 207; bun 18; creat 1.0; k+ 3.8; na++ 137  07-24-13: glucose 296; bun 24; creat 1.4; k+3.6; na++ 134 07-26-13: wbc 15.5; hgb 9.8; hct 34.1; mcv 85; plt 254; glucose 229; bun 25; creat 1.2; k+3.7;na++ 133 07-31-13: wbc 13.0; hgb 9.2; hct 32.8; mcv 85; plt 267; glucose 136;bun 26; creat 1.0; k+3.9; na++ 137; liver normal albumin 2.7 hgb a1c 7.7  08-02-13: glucose 97; bun 30;creat 1.2; k+4.0; na++ 140;  08-03-13: wbc 26.2; hgb 10.4; hct 35.9; mcv 85.1 plt 289; glucose 158; bun 29;creat 1.2;k+3.9; na++138  08-09-13: wbc 18.3; hgb 10.3; hct 37.6; mcv 86.2 plt 318    Review of Systems  Constitutional: Negative for fever and malaise/fatigue.  Respiratory: Negative for cough and shortness of breath.   Cardiovascular: Negative for chest pain, palpitations and leg swelling.  Gastrointestinal: Negative for heartburn, abdominal pain  and constipation.  Musculoskeletal: Negative for back pain, joint pain and myalgias.  Skin:       Has perianal sore   Neurological: Negative for dizziness and headaches.  Psychiatric/Behavioral: Negative for depression. The patient is not nervous/anxious.     Physical  Exam  Constitutional: He is oriented to person, place, and time. No distress.  obese  Neck: Neck supple. No JVD present. No thyromegaly present.  Cardiovascular: Normal rate, regular rhythm and intact distal pulses.   Respiratory: Effort normal and breath sounds normal. No respiratory distress.  O2 dependent   GI: Soft. Bowel sounds are normal. He exhibits no distension. There is no tenderness.  Genitourinary:  Has foley  Musculoskeletal: Normal range of motion. He exhibits no edema.  Neurological: He is alert and oriented to person, place, and time.  Skin: Skin is dry. He is not diaphoretic.  4.1 x 2.4 cm left perineal/scrotal abscess. Area is hard red warm to touch there is no drainage present.   4.3 x 2.3 cm left gluteal fluid collection, suspicious  for early/developing abscess. Area is red warm without drainage present.   Psychiatric: He has a normal mood and affect.      ASSESSMENT/ PLAN:  1. Perianal abscess: will extend out his cipro and flagyl out for 2 more weeks; will setup him a surgical consult ASAP for a possible I/D of the perianal abscess and will continue to monitor his status.   2. CHF: will reinstate his fluid restriction; and will restart his lasix. Will repeat a cbc and bmp in 10 days in of one has not been setup. Will place him on daily weights and will have nursing call me if he gains more than 3 pounds in one day or more than 7 pounds in one week.   3. Diabetes will continue his current regimen and will continue to monitor his status.   4. Urinary retention: will remove foley and will I/O cath every 6 hours as needed; will monitor   Time spent with patient 45 minutes.

## 2013-08-15 ENCOUNTER — Encounter (INDEPENDENT_AMBULATORY_CARE_PROVIDER_SITE_OTHER): Payer: Self-pay | Admitting: Surgery

## 2013-08-15 ENCOUNTER — Ambulatory Visit (INDEPENDENT_AMBULATORY_CARE_PROVIDER_SITE_OTHER): Payer: Medicare Other | Admitting: Surgery

## 2013-08-15 VITALS — BP 134/86 | HR 82 | Temp 99.0°F | Resp 18 | Ht 71.0 in | Wt 264.0 lb

## 2013-08-15 DIAGNOSIS — L0231 Cutaneous abscess of buttock: Secondary | ICD-10-CM

## 2013-08-15 NOTE — Progress Notes (Signed)
Patient ID: Bill [name], male   DOB: September 25, 1945, 67 y.o.   MRN: [id number]  Chief Complaint  Patient presents with  . Routine Post Op    peri. anal    HPI MAZIN [name] is a 67 y.o. male.  Referred by Dr. Glade Lloyd for evaluation of buttock abscess HPI This is a 67 year old  Male with multiple medical issues who is currently residing in a nursing home. Apparently he developed some swelling and tenderness in his Left buttock as well as his left perineum extending up into the scrotum. He has a CT scan dated 08/10/13 documenting 2 separate abscess these in these areas. The patient has been treated with antibiotics. The more anterior abscess has spontaneously drained and is feeling much better. His physician referred him today for further evaluation to make sure he does not need further drainage. The patient reports that he remembers falling on this area and developed a hematoma.   Past Medical History  Diagnosis Date  . COPD (chronic obstructive pulmonary disease)   . Diabetes mellitus without complication   . Hyperlipidemia   . Depression   . CHF (congestive heart failure)   . Gout   . OSA (obstructive sleep apnea)     History reviewed. No pertinent past surgical history.  Family History  Problem Relation Age of Onset  . Diabetes Mother   . Heart disease Mother     Social History History  Substance Use Topics  . Smoking status: Former Smoker -- 1.50 packs/day for 30 years    Quit date: 08/31/2006  . Smokeless tobacco: Never Used  . Alcohol Use: No    Allergies  Allergen Reactions  . Amoxil [Amoxicillin]     Per MAR  . Penicillins Other (See Comments)    unknown    Current Outpatient Prescriptions  Medication Sig Dispense Refill  . acetaminophen (TYLENOL) 325 MG tablet Take 650 mg by mouth every 4 (four) hours as needed for mild pain.      Marland Kitchen albuterol (PROVENTIL HFA;VENTOLIN HFA) 108 (90 BASE) MCG/ACT inhaler Inhale 2 puffs into the lungs every 6 (six) hours as needed  for wheezing or shortness of breath.       Marland Kitchen albuterol (PROVENTIL) (2.5 MG/3ML) 0.083% nebulizer solution Take 2.5 mg by nebulization 2 (two) times daily as needed for wheezing or shortness of breath.       . allopurinol (ZYLOPRIM) 300 MG tablet Take 300 mg by mouth 2 (two) times daily.      Marland Kitchen amLODipine (NORVASC) 10 MG tablet Take 10 mg by mouth daily.      . budesonide-formoterol (SYMBICORT) 160-4.5 MCG/ACT inhaler Inhale 2 puffs into the lungs 2 (two) times daily.      . ciprofloxacin (CIPRO) 750 MG tablet Take 750 mg by mouth 2 (two) times daily.      . citalopram (CELEXA) 40 MG tablet Take 40 mg by mouth daily.      . clonazePAM (KLONOPIN) 2 MG tablet Take 3 mg by mouth at bedtime as needed. Take three tablets by mouth every evening as needed for severe anxiety      . Cranberry 250 MG CAPS Take 1 capsule by mouth 2 (two) times daily.      . ferrous sulfate 325 (65 FE) MG tablet Take 325 mg by mouth 3 (three) times daily with meals.      . gabapentin (NEURONTIN) 100 MG capsule Take 100 mg by mouth 2 (two) times daily.      Marland Kitchen  insulin detemir (LEVEMIR) 100 UNIT/ML injection Inject 0.4 mLs (40 Units total) into the skin daily.  10 mL  12  . insulin lispro (HUMALOG) 100 UNIT/ML injection Inject 2-10 Units into the skin 4 (four) times daily -  before meals and at bedtime. Sliding scale      . metroNIDAZOLE (FLAGYL) 500 MG tablet Take 500 mg by mouth 3 (three) times daily.      Marland Kitchen OLANZapine (ZYPREXA) 10 MG tablet Take 10 mg by mouth daily.      . pantoprazole (PROTONIX) 40 MG tablet Take 40 mg by mouth 2 (two) times daily.      . polyethylene glycol (MIRALAX / GLYCOLAX) packet Take 17 g by mouth daily.      . potassium chloride SA (K-DUR,KLOR-CON) 20 MEQ tablet Take 20 mEq by mouth daily.      . predniSONE (DELTASONE) 10 MG tablet Take 10 mg by mouth daily with breakfast.      . primidone (MYSOLINE) 250 MG tablet Take 250 mg by mouth 2 (two) times daily.      . sennosides-docusate sodium  (SENOKOT-S) 8.6-50 MG tablet Take 1 tablet by mouth 2 (two) times daily.      . sitaGLIPtin-metformin (JANUMET) 50-500 MG per tablet Take 1 tablet by mouth 2 (two) times daily with a meal.      . tamsulosin (FLOMAX) 0.4 MG CAPS capsule Take 0.4 mg by mouth daily.      Marland Kitchen tiotropium (SPIRIVA) 18 MCG inhalation capsule Place 18 mcg into inhaler and inhale daily.      . traMADol (ULTRAM) 50 MG tablet Take 1 tablet by mouth every 12 hours as needed for pain.  60 tablet  0  . zolpidem (AMBIEN) 10 MG tablet Take 10 mg by mouth at bedtime as needed for sleep.       No current facility-administered medications for this visit.    Review of Systems Review of Systems  Blood pressure 134/86, pulse 82, temperature 99 F (37.2 C), resp. rate 18, height 5\' 11"  (1.803 m), weight 264 lb (119.75 kg).  Physical Exam Physical Exam Obese white male in no apparent distress Left buttock shows some firm induration but no overlying erythema. No palpable abscess or drainage noted in the perineum or the scrotum.  Data Reviewed Ct Pelvis W Contrast  08/10/2013   CLINICAL DATA:  Left buttock cellulitis, evaluate for perineal abscess  EXAM: CT PELVIS WITH CONTRAST  TECHNIQUE: Multidetector CT imaging of the pelvis was performed using the standard protocol following the bolus administration of intravenous contrast.  CONTRAST:  OMNIPAQUE IOHEXOL 300 MG/ML  SOLN  COMPARISON:  None.  FINDINGS: 4.1 x 2.4 cm left perineal/scrotal abscess (series 2/image 63) with associated foci of soft tissue gas (series 2/image 62).  Additional inflammatory changes/soft tissue stranding extends posteriorly (series 2/image 61), tracking to an associated 4.3 x 2.3 cm left gluteal fluid collection (series 2/ image 57). This appearance is suspicious for early/developing abscess, although without well-defined rim.  Additional mild subcutaneous stranding in the left gluteal region (series 2/image 45), likely reflecting cellulitis.  Prostate is  unremarkable.  Bladder is mildly thick-walled, correlate for cystitis. Indwelling Foley catheter.  No pelvic ascites.  Small retroperitoneal/pelvic lymph nodes measuring up to 10-11 mm short axis (series 2/ images 31 and 32), likely reactive.  Visualized bowel is unremarkable.  Degenerative changes of the lower lumbar spine.  IMPRESSION: 4.1 x 2.4 cm left perineal/scrotal abscess. Associated foci of soft tissue gas, correlate for recent  intervention.  Additional 4.3 x 2.3 cm left gluteal fluid collection, suspicious for early/developing abscess.  Associated mild subcutaneous stranding in the left gluteal region, likely reflecting cellulitis.  Mildly thick-walled bladder, correlate for cystitis.  These results were called by telephone at the time of interpretation on 08/10/2013 at 5:46 PM to Dr. Chilton Si, covering physician for Dr Glade Lloyd, who verbally acknowledged these results. He confirmed if the patient did have an abscess drainage procedure earlier today, likely accounting for the soft tissue gas within the left perineal/scrotal abscess.   Electronically Signed   By: Charline Bills M.D.   On: 08/10/2013 17:48   Dg Chest Port 1 View  (if Code Sepsis Called)  07/26/2013   CLINICAL DATA:  Shortness of Breath  EXAM: PORTABLE CHEST - 1 VIEW  COMPARISON:  07/12/2013  FINDINGS: Borderline cardiomegaly. Mild interstitial prominence bilaterally. No convincing pulmonary edema. Poor inspiration with streaky bilateral basilar atelectasis or infiltrate.  IMPRESSION: Poor inspiration with streaky bilateral basilar atelectasis or infiltrate. Mild interstitial prominence bilaterally without convincing pulmonary edema.   Electronically Signed   By: Natasha Mead M.D.   On: 07/26/2013 20:24      Assessment    Left scrotal abscess - spontaneously drained; much improved Left buttock abscess/ hematoma - needs exploration    Plan    We prepped this area with Betadine and anesthetized with 1% lidocaine. I made a 1 cm  incision and we probed down into this cavity. We only encountered some old blood clot. No pus was noted. A dry dressing was applied.  The patient needs no further management of these problems. The abscess in his perineum a spontaneously drained and resolved. The other area represents old hematoma. He may followup as needed.  Wilmon Arms. Corliss Skains, MD, [facility]  General/ Trauma Surgery  08/15/2013 5:16 PM         Zach Tietje K. 08/15/2013, 5:10 PM

## 2013-09-01 ENCOUNTER — Other Ambulatory Visit: Payer: Self-pay | Admitting: *Deleted

## 2013-09-01 MED ORDER — HYDROCODONE-ACETAMINOPHEN 5-325 MG PO TABS: 1.0000 | ORAL_TABLET | ORAL | Status: DC | PRN

## 2013-09-04 ENCOUNTER — Non-Acute Institutional Stay (SKILLED_NURSING_FACILITY): Payer: Medicare HMO | Admitting: Adult Health

## 2013-09-04 ENCOUNTER — Encounter: Payer: Self-pay | Admitting: Adult Health

## 2013-09-04 DIAGNOSIS — I739 Peripheral vascular disease, unspecified: Secondary | ICD-10-CM

## 2013-09-04 DIAGNOSIS — K5900 Constipation, unspecified: Secondary | ICD-10-CM

## 2013-09-04 DIAGNOSIS — G250 Essential tremor: Secondary | ICD-10-CM

## 2013-09-04 DIAGNOSIS — J449 Chronic obstructive pulmonary disease, unspecified: Secondary | ICD-10-CM

## 2013-09-04 DIAGNOSIS — E1149 Type 2 diabetes mellitus with other diabetic neurological complication: Secondary | ICD-10-CM

## 2013-09-04 DIAGNOSIS — E1142 Type 2 diabetes mellitus with diabetic polyneuropathy: Secondary | ICD-10-CM

## 2013-09-04 DIAGNOSIS — N400 Enlarged prostate without lower urinary tract symptoms: Secondary | ICD-10-CM

## 2013-09-04 DIAGNOSIS — D638 Anemia in other chronic diseases classified elsewhere: Secondary | ICD-10-CM

## 2013-09-04 DIAGNOSIS — G252 Other specified forms of tremor: Secondary | ICD-10-CM

## 2013-09-04 DIAGNOSIS — K219 Gastro-esophageal reflux disease without esophagitis: Secondary | ICD-10-CM

## 2013-09-04 DIAGNOSIS — M109 Gout, unspecified: Secondary | ICD-10-CM

## 2013-09-04 DIAGNOSIS — F32A Depression, unspecified: Secondary | ICD-10-CM

## 2013-09-04 DIAGNOSIS — F329 Major depressive disorder, single episode, unspecified: Secondary | ICD-10-CM

## 2013-09-04 DIAGNOSIS — J9610 Chronic respiratory failure, unspecified whether with hypoxia or hypercapnia: Secondary | ICD-10-CM

## 2013-09-04 DIAGNOSIS — F411 Generalized anxiety disorder: Secondary | ICD-10-CM

## 2013-09-04 DIAGNOSIS — F3289 Other specified depressive episodes: Secondary | ICD-10-CM

## 2013-09-04 DIAGNOSIS — I150 Renovascular hypertension: Secondary | ICD-10-CM

## 2013-09-04 NOTE — Progress Notes (Signed)
Patient ID: Bill [name], male   DOB: [birth year], 68 y.o.   MRN: [id number]     [facility]  Allergies  Allergen Reactions  . Amoxil [Amoxicillin]     Per MAR  . Penicillins Other (See Comments)    unknown     Chief Complaint  Patient presents with  . Medical Managment of Chronic Issues    HPI:  He is being seen for his chronic medical illnesses. There are no concerns being voiced by nursing staff at this time. He is complaining of gout pain in his left ankle. He has a history  Of having gout in the past in his left ankle and elbow.  His abscess has resolved. His status is improving. He tells me that there are no plans at this time for him to go home.    Past Medical History  Diagnosis Date  . COPD (chronic obstructive pulmonary disease)   . Diabetes mellitus without complication   . Hyperlipidemia   . Depression   . CHF (congestive heart failure)   . Gout   . OSA (obstructive sleep apnea)   . Pneumonia 07/26/2013  . CHF with unknown LVEF 08/11/2013  . Chronic respiratory failure 06/19/2013  . GERD (gastroesophageal reflux disease) 06/19/2013  . DM type 2 with diabetic peripheral neuropathy 06/19/2013  . BPH (benign prostatic hyperplasia) 06/19/2013  . AKI (acute kidney injury) 07/27/2013  . Urinary retention 08/11/2013  . Sepsis 07/27/2013  . Physical deconditioning 06/19/2013  . Perineal abscess 08/01/2013  . Depression, controlled 06/19/2013  . Anemia of chronic disease 07/31/2013    No past surgical history on file.  VITAL SIGNS BP 118/61  Pulse 80  Ht 5\' 11"  (1.803 m)  Wt 262 lb 9.6 oz (119.115 kg)  BMI 36.64 kg/m2   Patient's Medications  New Prescriptions   No medications on file  Previous Medications   ACETAMINOPHEN (TYLENOL) 325 MG TABLET    Take 650 mg by mouth every 4 (four) hours as needed for mild pain.   ALBUTEROL (PROVENTIL HFA;VENTOLIN HFA) 108 (90 BASE) MCG/ACT INHALER    Inhale 2 puffs into the lungs every 6 (six) hours as needed for  wheezing or shortness of breath.    ALBUTEROL (PROVENTIL) (2.5 MG/3ML) 0.083% NEBULIZER SOLUTION    Take 2.5 mg by nebulization 2 (two) times daily as needed for wheezing or shortness of breath.    ALLOPURINOL (ZYLOPRIM) 300 MG TABLET    Take 300 mg by mouth 2 (two) times daily.   AMLODIPINE (NORVASC) 10 MG TABLET    Take 10 mg by mouth daily.   BUDESONIDE-FORMOTEROL (SYMBICORT) 160-4.5 MCG/ACT INHALER    Inhale 2 puffs into the lungs 2 (two) times daily.   CITALOPRAM (CELEXA) 40 MG TABLET    Take 40 mg by mouth daily.   CLONAZEPAM (KLONOPIN) 2 MG TABLET    Take 3 mg by mouth at bedtime as needed. Take three tablets by mouth every evening as needed for severe anxiety   CRANBERRY 250 MG CAPS    Take 1 capsule by mouth 2 (two) times daily.   FERROUS SULFATE 325 (65 FE) MG TABLET    Take 325 mg by mouth 3 (three) times daily with meals.   FUROSEMIDE (LASIX) 80 MG TABLET    Take 80 mg by mouth daily.   GABAPENTIN (NEURONTIN) 100 MG CAPSULE    Take 100 mg by mouth 2 (two) times daily.   HYDROCODONE-ACETAMINOPHEN (NORCO/VICODIN) 5-325 MG PER TABLET    Take  1 tablet by mouth every 4 (four) hours as needed for moderate pain.   INSULIN DETEMIR (LEVEMIR) 100 UNIT/ML INJECTION    Inject 0.4 mLs (40 Units total) into the skin daily.   INSULIN LISPRO (HUMALOG) 100 UNIT/ML INJECTION    Inject 2-10 Units into the skin 4 (four) times daily -  before meals and at bedtime. Sliding scale   OLANZAPINE (ZYPREXA) 10 MG TABLET    Take 10 mg by mouth daily.   PANTOPRAZOLE (PROTONIX) 40 MG TABLET    Take 40 mg by mouth 2 (two) times daily.   POLYETHYLENE GLYCOL (MIRALAX / GLYCOLAX) PACKET    Take 17 g by mouth daily.   POTASSIUM CHLORIDE SA (K-DUR,KLOR-CON) 20 MEQ TABLET    Take 20 mEq by mouth daily.   PREDNISONE (DELTASONE) 10 MG TABLET    Take 10 mg by mouth daily with breakfast.   PRIMIDONE (MYSOLINE) 250 MG TABLET    Take 250 mg by mouth 2 (two) times daily.   SENNOSIDES-DOCUSATE SODIUM (SENOKOT-S) 8.6-50 MG TABLET     Take 1 tablet by mouth 2 (two) times daily.   SITAGLIPTIN-METFORMIN (JANUMET) 50-500 MG PER TABLET    Take 1 tablet by mouth 2 (two) times daily with a meal.   TAMSULOSIN (FLOMAX) 0.4 MG CAPS CAPSULE    Take 0.4 mg by mouth daily.   TIOTROPIUM (SPIRIVA) 18 MCG INHALATION CAPSULE    Place 18 mcg into inhaler and inhale daily.   TRAMADOL (ULTRAM) 50 MG TABLET    Take 1 tablet by mouth every 12 hours as needed for pain.   ZOLPIDEM (AMBIEN) 10 MG TABLET    Take 10 mg by mouth at bedtime as needed for sleep.  Modified Medications   No medications on file  Discontinued Medications   No medications on file    SIGNIFICANT DIAGNOSTIC EXAMS  07-26-13: chest x-ray: minimal bibasilar pneumonitis with superimposed mild congestive changes.   08-04-13: kub: bowel gas pattern could be secondary to partial bowel obstruction versus moderate to severe ileus. No abnormal calcifications in the region of the abdomen   08-10-13: ct of pelvis: 4.1 x 2.4 cm left perineal/scrotal abscess. Associated foci of soft tissue gas, correlate for recent intervention.  Additional 4.3 x 2.3 cm left gluteal fluid collection, suspicious  for early/developing abscess.  Associated mild subcutaneous stranding in the left gluteal region,  likely reflecting cellulitis.  Mildly thick-walled bladder, correlate for cystitis.   LAS REVIEWED:   07-10-13: glucose 207; bun 18; creat 1.0; k+ 3.8; na++ 137  07-24-13: glucose 296; bun 24; creat 1.4; k+3.6; na++ 134 07-26-13: wbc 15.5; hgb 9.8; hct 34.1; mcv 85; plt 254; glucose 229; bun 25; creat 1.2; k+3.7;na++ 133 07-31-13: wbc 13.0; hgb 9.2; hct 32.8; mcv 85; plt 267; glucose 136;bun 26; creat 1.0; k+3.9; na++ 137; liver normal albumin 2.7 hgb a1c 7.7  08-02-13: glucose 97; bun 30;creat 1.2; k+4.0; na++ 140;  08-03-13: wbc 26.2; hgb 10.4; hct 35.9; mcv 85.1 plt 289; glucose 158; bun 29;creat 1.2;k+3.9; na++138  08-09-13: wbc 18.3; hgb 10.3; hct 37.6; mcv 86.2 plt 318  08-21-13: wbc  8.8; hgb 10.8; hct 38.3; mcv 85.1;plt 316; glucose 106; bun 13; creat 0.9; k+3.9; na++138      .Review of Systems  Constitutional: Negative for malaise/fatigue.  HENT: Positive for congestion.        Has post nasal drip   Respiratory: Positive for cough. Negative for shortness of breath and wheezing.        Post  nasal drip is causing cough  Cardiovascular: Negative for chest pain, palpitations and leg swelling.  Gastrointestinal: Negative for heartburn, abdominal pain and constipation.  Musculoskeletal: Positive for joint pain. Negative for myalgias.       Has left ankle pain  Skin: Negative.   Neurological: Negative for dizziness, weakness and headaches.  Psychiatric/Behavioral: Negative for depression. The patient is not nervous/anxious and does not have insomnia.     Physical Exam  Constitutional: He is oriented to person, place, and time. He appears well-developed and well-nourished. No distress.  obese  Neck: Neck supple. No JVD present. No thyromegaly present.  Cardiovascular: Normal rate, regular rhythm and intact distal pulses.   Respiratory: Effort normal and breath sounds normal. No respiratory distress. He has no wheezes.  GI: Soft. Bowel sounds are normal. He exhibits no distension. There is no tenderness.  Musculoskeletal: Normal range of motion. He exhibits no edema.  Left ankle is red; hot and tender to touch  Neurological: He is alert and oriented to person, place, and time.  Skin: Skin is warm. He is not diaphoretic.  Psychiatric: He has a normal mood and affect.     ASSESSMENT/ PLAN:  1. Gout: is having a flare: will continue allopurinol 300 mg twice daily and will begin colchicine 0.6 mg twice daily for 2 days then daily for one week then daily as needed will monitor his status.   2. COPD: he is stable: will continue symbicort 160/4.5 mcg 2 puffs twice daily; spiriva 18 mcg inhaled daily; prednisone 10 mg daily; and albuterol 2 puffs or neb treatment four  times daily as needed. Will continue to monitor   3. Diabetes: is stable will januvamet 50/500 mg twice daily levemir 40 units daily and will change humalog to 5 units prior to meals for cbg >=150 and will monitor  4. Allergic rhinitis: will begin flonase 2 puffs per nostril nightly and will monitor  5. CHF: he is stable will continue his fluid restriction 1800 cc daily; daily weights; lasix 80 mg daily with k+ 20 meq daily ; and will monitor  6. Hypertension: is stable will continue norvasc 10 mg daily   7. Pvd: will continue his neurontin 100 mg twice daily and will monitor  8. Tremor: he is well controlled at this time; will continue mysoline 250 mg twice daily   9. Bph: will continue flomax 0.4 mg daily  10.: constipation: will continue miralax daily; senna s twice daily   11. Gerd: will continue protonix 40 mg twice daily  12. Anemia: is stable will continue iron three times daily  13. Depression with anxiety: he is emotionally stable: will continue zyprexa 10 mg nightly; celexa 40 mg daily; and takes klonopin 3 mg nightly on occasions. Has ambien 10 mg nightly for sleep.

## 2013-09-11 NOTE — Progress Notes (Signed)
Date of Visit 07/21/2013 CODE STATUS DO NOT RESUSCITATE [name] Patient ID: Bill [name], male   DOB: 1946/05/07, 68 y.o.   MRN: [id number]  Allergies  Allergen Reactions  . Amoxil [Amoxicillin]     Per MAR  . Penicillins Other (See Comments)    unknown   Chief Complaint  Patient presents with  . Acute Visit   HPI:   The patient is a 68 year old Caucasian male, with a long history of COPD and congestive heart failure. Medical history complicated by smoking from the age of 89, until 2013. One pack per day, a 53-pack-year history. The patient is also on oxygen at 2 L. He is maintained on chronic prednisone related to his severe COPD. Patient also with a history of sleep apnea, depression, hypertension, and posthemorrhagic anemia.  He also has very poorly controlled diabetes, complicated by chronic prednisone therapy, and frequently eating high calorie high sugar foods, and drinking high-calorie liquids such as regular Coca-Cola.  Was most recently hospitalized, related to exacerbation of his COPD, on 07/12/2013. He returned the facility a few days later. During his hospitalization he was on a max dose of prednisone at 60 mg.  I been asked to see the patient today, related to increased lower extremity edema and concern for possible cellulitis.  Review of Systems  HENT: Negative for ear discharge, ear pain, nosebleeds and sore throat.   Eyes: Negative for discharge and redness.  Respiratory: Positive for shortness of breath and wheezing. Negative for hemoptysis.   Cardiovascular: Positive for leg swelling.  Gastrointestinal: Negative for blood in stool.  Genitourinary: Negative for frequency and hematuria.  Skin:       Erythema distal lower extremities  Neurological: Positive for weakness. Negative for seizures and loss of consciousness.  Psychiatric/Behavioral: Negative for suicidal ideas and hallucinations. The patient is nervous/anxious.    Past Medical History    Diagnosis Date  . COPD (chronic obstructive pulmonary disease)   . Diabetes mellitus without complication   . Hyperlipidemia   . Depression   . CHF (congestive heart failure)   . Gout   . OSA (obstructive sleep apnea)   . Pneumonia 07/26/2013  . CHF with unknown LVEF 08/11/2013  . Chronic respiratory failure 06/19/2013  . GERD (gastroesophageal reflux disease) 06/19/2013  . DM type 2 with diabetic peripheral neuropathy 06/19/2013  . BPH (benign prostatic hyperplasia) 06/19/2013  . AKI (acute kidney injury) 07/27/2013  . Urinary retention 08/11/2013  . Sepsis 07/27/2013  . Physical deconditioning 06/19/2013  . Perineal abscess 08/01/2013  . Depression, controlled 06/19/2013  . Anemia of chronic disease 07/31/2013   No past surgical history on file.  Current Outpatient Prescriptions on File Prior to Visit  Medication Sig Dispense Refill  . acetaminophen (TYLENOL) 325 MG tablet Take 650 mg by mouth every 4 (four) hours as needed for mild pain.      Marland Kitchen albuterol (PROVENTIL HFA;VENTOLIN HFA) 108 (90 BASE) MCG/ACT inhaler Inhale 2 puffs into the lungs every 6 (six) hours as needed for wheezing or shortness of breath.       Marland Kitchen albuterol (PROVENTIL) (2.5 MG/3ML) 0.083% nebulizer solution Take 2.5 mg by nebulization 2 (two) times daily as needed for wheezing or shortness of breath.       . allopurinol (ZYLOPRIM) 300 MG tablet Take 300 mg by mouth 2 (two) times daily.      Marland Kitchen amLODipine (NORVASC) 10 MG tablet Take 10 mg by mouth daily.      . budesonide-formoterol (SYMBICORT)  160-4.5 MCG/ACT inhaler Inhale 2 puffs into the lungs 2 (two) times daily.      . citalopram (CELEXA) 40 MG tablet Take 40 mg by mouth daily.      . ferrous sulfate 325 (65 FE) MG tablet Take 325 mg by mouth 3 (three) times daily with meals.      . gabapentin (NEURONTIN) 100 MG capsule Take 100 mg by mouth 2 (two) times daily.      . insulin lispro (HUMALOG) 100 UNIT/ML injection Inject 2-10 Units into the skin 4 (four)  times daily -  before meals and at bedtime. Sliding scale      . OLANZapine (ZYPREXA) 10 MG tablet Take 10 mg by mouth daily.      . pantoprazole (PROTONIX) 40 MG tablet Take 40 mg by mouth 2 (two) times daily.      . potassium chloride SA (K-DUR,KLOR-CON) 20 MEQ tablet Take 20 mEq by mouth daily.      . primidone (MYSOLINE) 250 MG tablet Take 250 mg by mouth 2 (two) times daily.      . sitaGLIPtin-metformin (JANUMET) 50-500 MG per tablet Take 1 tablet by mouth 2 (two) times daily with a meal.      . tamsulosin (FLOMAX) 0.4 MG CAPS capsule Take 0.4 mg by mouth daily.      Marland Kitchen tiotropium (SPIRIVA) 18 MCG inhalation capsule Place 18 mcg into inhaler and inhale daily.      . traMADol (ULTRAM) 50 MG tablet Take 1 tablet by mouth every 12 hours as needed for pain.  60 tablet  0  . zolpidem (AMBIEN) 10 MG tablet Take 10 mg by mouth at bedtime as needed for sleep.       No current facility-administered medications on file prior to visit.   Most recent labs, done 07/10/2013 WBC 7.9 RBC 3.5 Hemoglobin 8.6 Hematocrit 30.9 MCV 86.3 MCH 24.6 MCHC 27.8 RDW 20.1 Platelets 207  Sodium 137 Potassium 3.8 Chloride 99 CO2 31 AGAP 7 Glucose 207 BUN 18 Creatinine 1 Calcium 8.7  Vital Signs:  BP 148/72, Pulse 84, T 97.1, RR 22, Pulse Ox 100% on 2 L O2  Physical examination  Patient is alert, verbally appropriate, oriented x3, in no acute distress  Head is normocephalic  Pupils are equally round and reactive to light, positive red reflex, extraocular movements are intact, conjunctiva are pale.  TMs unremarkable  Oropharynx, dentures are in place, no apparent abnormal oral lesions  No palpable cervical adenopathy or thyromegaly. Carotid bruits are not evident.  Bilateral breath sounds are diminished in bases, otherwise no distinct rales or rhonchi. Question of faint wheezes right lateral lung fields.  Apical pulse with essentially a regular rate and rhythm  Abdomen, protuberant, soft  bowel sounds.  Faintly palpable pedal dorsalis pulses. Bilateral feet with 3-4+ pitting edema, question of trace edema posterior thighs.  Legs with some erythema but not consistent with cellulitis.  Assessment/plan  Edema, complicated by the fact that patient frequently sits with his legs hanging down. Ongoing to implement Bumex 2 mg. The patient has been on very large doses of Lasix, thinks he might have a better response to a different medication.  I do not believe the patient has cellulitis at this time.  Anemia, as per labs from 07/10/2012, hemoglobin and hematocrit gradually improving.  COPD continue current regimen  Diabetes under very poor control, would like to add a long acting insulin such as Levaquin there are Lantus at 10 units per day. Have also discussed  with the patient at length that he absolutely positively cannot drink sweet tea, regular Coca-Cola or other sugar sweet liquids including fruit juice. Discussed that sweet liquids, in particular, causing significant and rapid elevation of blood sugar. Discussed with the patient that being on chronic prednisone complicates ability to reasonably controlled blood sugars.  Continue his sliding scale and other diabetes medicines.  Will have a metabolic profile drawn on Monday, 07/24/2013

## 2013-09-22 ENCOUNTER — Non-Acute Institutional Stay (SKILLED_NURSING_FACILITY): Payer: Medicare HMO | Admitting: Adult Health

## 2013-09-22 DIAGNOSIS — J019 Acute sinusitis, unspecified: Secondary | ICD-10-CM

## 2013-09-25 ENCOUNTER — Encounter: Payer: Self-pay | Admitting: Adult Health

## 2013-09-25 NOTE — Progress Notes (Signed)
Patient ID: Bill [name], male   DOB: 1945-09-22, 68 y.o.   MRN: [id number]     [facility]  Allergies  Allergen Reactions  . Amoxil [Amoxicillin]     Per MAR  . Penicillins Other (See Comments)    unknown     Chief Complaint  Patient presents with  . Acute Visit    cough    HPI:  He has developed a cough with Platon Arocho sputum. He has increased shortness of breath present. Has increased wheezing present as well. There are no reports of fever present. He is able to sleep with his head of bed flat. There is no change in appetite present. He does have a history of copd.    Past Medical History  Diagnosis Date  . COPD (chronic obstructive pulmonary disease)   . Diabetes mellitus without complication   . Hyperlipidemia   . Depression   . CHF (congestive heart failure)   . Gout   . OSA (obstructive sleep apnea)   . Pneumonia 07/26/2013  . CHF with unknown LVEF 08/11/2013  . Chronic respiratory failure 06/19/2013  . GERD (gastroesophageal reflux disease) 06/19/2013  . DM type 2 with diabetic peripheral neuropathy 06/19/2013  . BPH (benign prostatic hyperplasia) 06/19/2013  . AKI (acute kidney injury) 07/27/2013  . Urinary retention 08/11/2013  . Sepsis 07/27/2013  . Physical deconditioning 06/19/2013  . Perineal abscess 08/01/2013  . Depression, controlled 06/19/2013  . Anemia of chronic disease 07/31/2013    No past surgical history on file.  VITAL SIGNS BP 116/62  Pulse 73  Ht 5\' 11"  (1.803 m)  Wt 263 lb 8 oz (119.523 kg)  BMI 36.77 kg/m2   Patient's Medications  New Prescriptions   No medications on file  Previous Medications   ACETAMINOPHEN (TYLENOL) 325 MG TABLET    Take 650 mg by mouth every 4 (four) hours as needed for mild pain.   ALBUTEROL (PROVENTIL HFA;VENTOLIN HFA) 108 (90 BASE) MCG/ACT INHALER    Inhale 2 puffs into the lungs every 6 (six) hours as needed for wheezing or shortness of breath.    ALBUTEROL (PROVENTIL) (2.5 MG/3ML) 0.083% NEBULIZER  SOLUTION    Take 2.5 mg by nebulization 2 (two) times daily as needed for wheezing or shortness of breath.    ALLOPURINOL (ZYLOPRIM) 300 MG TABLET    Take 300 mg by mouth 2 (two) times daily.   AMLODIPINE (NORVASC) 10 MG TABLET    Take 10 mg by mouth daily.   BUDESONIDE-FORMOTEROL (SYMBICORT) 160-4.5 MCG/ACT INHALER    Inhale 2 puffs into the lungs 2 (two) times daily.   CITALOPRAM (CELEXA) 40 MG TABLET    Take 40 mg by mouth daily.   CLONAZEPAM (KLONOPIN) 2 MG TABLET    Take 3 mg by mouth at bedtime as needed. Take three tablets by mouth every evening as needed for severe anxiety   CRANBERRY 250 MG CAPS    Take 1 capsule by mouth 2 (two) times daily.   FERROUS SULFATE 325 (65 FE) MG TABLET    Take 325 mg by mouth 3 (three) times daily with meals.   FUROSEMIDE (LASIX) 80 MG TABLET    Take 80 mg by mouth daily.   GABAPENTIN (NEURONTIN) 100 MG CAPSULE    Take 100 mg by mouth 2 (two) times daily.   HYDROCODONE-ACETAMINOPHEN (NORCO/VICODIN) 5-325 MG PER TABLET    Take 1 tablet by mouth every 4 (four) hours as needed for moderate pain.   INSULIN DETEMIR (LEVEMIR) 100  UNIT/ML INJECTION    Inject 0.4 mLs (40 Units total) into the skin daily.   INSULIN LISPRO (HUMALOG) 100 UNIT/ML INJECTION    Inject 2-10 Units into the skin 4 (four) times daily -  before meals and at bedtime. Sliding scale   OLANZAPINE (ZYPREXA) 10 MG TABLET    Take 10 mg by mouth daily.   PANTOPRAZOLE (PROTONIX) 40 MG TABLET    Take 40 mg by mouth 2 (two) times daily.   POLYETHYLENE GLYCOL (MIRALAX / GLYCOLAX) PACKET    Take 17 g by mouth daily.   POTASSIUM CHLORIDE SA (K-DUR,KLOR-CON) 20 MEQ TABLET    Take 20 mEq by mouth daily.   PREDNISONE (DELTASONE) 10 MG TABLET    Take 10 mg by mouth daily with breakfast.   PRIMIDONE (MYSOLINE) 250 MG TABLET    Take 250 mg by mouth 2 (two) times daily.   SENNOSIDES-DOCUSATE SODIUM (SENOKOT-S) 8.6-50 MG TABLET    Take 1 tablet by mouth 2 (two) times daily.   SITAGLIPTIN-METFORMIN (JANUMET) 50-500  MG PER TABLET    Take 1 tablet by mouth 2 (two) times daily with a meal.   TAMSULOSIN (FLOMAX) 0.4 MG CAPS CAPSULE    Take 0.4 mg by mouth daily.   TIOTROPIUM (SPIRIVA) 18 MCG INHALATION CAPSULE    Place 18 mcg into inhaler and inhale daily.   TRAMADOL (ULTRAM) 50 MG TABLET    Take 1 tablet by mouth every 12 hours as needed for pain.   ZOLPIDEM (AMBIEN) 10 MG TABLET    Take 10 mg by mouth at bedtime as needed for sleep.  Modified Medications   No medications on file  Discontinued Medications   No medications on file    SIGNIFICANT DIAGNOSTIC EXAMS  07-26-13: chest x-ray: minimal bibasilar pneumonitis with superimposed mild congestive changes.   08-04-13: kub: bowel gas pattern could be secondary to partial bowel obstruction versus moderate to severe ileus. No abnormal calcifications in the region of the abdomen   08-10-13: ct of pelvis: 4.1 x 2.4 cm left perineal/scrotal abscess. Associated foci of soft tissue gas, correlate for recent intervention.  Additional 4.3 x 2.3 cm left gluteal fluid collection, suspicious  for early/developing abscess.  Associated mild subcutaneous stranding in the left gluteal region,  likely reflecting cellulitis.  Mildly thick-walled bladder, correlate for cystitis.   LAS REVIEWED:   07-10-13: glucose 207; bun 18; creat 1.0; k+ 3.8; na++ 137  07-24-13: glucose 296; bun 24; creat 1.4; k+3.6; na++ 134 07-26-13: wbc 15.5; hgb 9.8; hct 34.1; mcv 85; plt 254; glucose 229; bun 25; creat 1.2; k+3.7;na++ 133 07-31-13: wbc 13.0; hgb 9.2; hct 32.8; mcv 85; plt 267; glucose 136;bun 26; creat 1.0; k+3.9; na++ 137; liver normal albumin 2.7 hgb a1c 7.7  08-02-13: glucose 97; bun 30;creat 1.2; k+4.0; na++ 140;  08-03-13: wbc 26.2; hgb 10.4; hct 35.9; mcv 85.1 plt 289; glucose 158; bun 29;creat 1.2;k+3.9; na++138  08-09-13: wbc 18.3; hgb 10.3; hct 37.6; mcv 86.2 plt 318  08-21-13: wbc 8.8; hgb 10.8; hct 38.3; mcv 85.1;plt 316; glucose 106; bun 13; creat 0.9; k+3.9; na++138        .Review of Systems  Constitutional: Negative for malaise/fatigue.  HENT: Positive for congestion.        Has post nasal drip   Respiratory: Positive for cough. Negative for shortness of breath and has wheezing.        Post nasal drip is causing cough  Cardiovascular: Negative for chest pain, palpitations and leg swelling.  Gastrointestinal:  Negative for heartburn, abdominal pain and constipation.  Musculoskeletal: negative for pint pain . Negative for myalgias.  Skin: Negative.   Neurological: Negative for dizziness, weakness and headaches.  Psychiatric/Behavioral: Negative for depression. The patient is not nervous/anxious and does not have insomnia.     Physical Exam  Constitutional: He is oriented to person, place, and time. He appears well-developed and well-nourished. No distress.  obese  Neck: Neck supple. No JVD present. No thyromegaly present. has Topaz Raglin post nasal drainage Cardiovascular: Normal rate, regular rhythm and intact distal pulses.   Respiratory: Effort normal and breath sounds normal. No respiratory distress. Has rhonchi; wheezes.  GI: Soft. Bowel sounds are normal. He exhibits no distension. There is no tenderness.  Musculoskeletal: Normal range of motion. He exhibits no edema.   Neurological: He is alert and oriented to person, place, and time.  Skin: Skin is warm. He is not diaphoretic.  Psychiatric: He has a normal mood and affect.       ASSESSMENT/ PLAN:  1. Acute sinusitis: Zithromax 500 mg daily for 7 days with florastor twice daily for 7 days will begin flonase nightly for one bottle and will monitor his status.

## 2013-10-05 ENCOUNTER — Non-Acute Institutional Stay (SKILLED_NURSING_FACILITY): Payer: Medicare HMO | Admitting: Adult Health

## 2013-10-05 ENCOUNTER — Encounter: Payer: Self-pay | Admitting: Adult Health

## 2013-10-05 DIAGNOSIS — J449 Chronic obstructive pulmonary disease, unspecified: Secondary | ICD-10-CM

## 2013-10-05 DIAGNOSIS — L02239 Carbuncle of trunk, unspecified: Secondary | ICD-10-CM

## 2013-10-05 DIAGNOSIS — J4489 Other specified chronic obstructive pulmonary disease: Secondary | ICD-10-CM

## 2013-10-05 DIAGNOSIS — L02229 Furuncle of trunk, unspecified: Secondary | ICD-10-CM

## 2013-10-05 DIAGNOSIS — J019 Acute sinusitis, unspecified: Secondary | ICD-10-CM

## 2013-10-05 DIAGNOSIS — L02224 Furuncle of groin: Secondary | ICD-10-CM

## 2013-10-05 NOTE — Progress Notes (Signed)
Patient ID: Bill [name], male   DOB: Jun 03, 1946, 68 y.o.   MRN: [id number]     [facility]  Allergies  Allergen Reactions  . Amoxil [Amoxicillin]     Per MAR  . Penicillins Other (See Comments)    unknown     Chief Complaint  Patient presents with  . Acute Visit    patient concerns    HPI:  He continues to have a cough with congestion present. The sputum is [name] most of the time. He continues to have increased shortness of breath present. There are no reports of fever present. He also has a boil on his right lower groin area which burst this morning has purulent drainage present. The area is red and inflamed.  Past Medical History  Diagnosis Date  . COPD (chronic obstructive pulmonary disease)   . Diabetes mellitus without complication   . Hyperlipidemia   . Depression   . CHF (congestive heart failure)   . Gout   . OSA (obstructive sleep apnea)   . Pneumonia 07/26/2013  . CHF with unknown LVEF 08/11/2013  . Chronic respiratory failure 06/19/2013  . GERD (gastroesophageal reflux disease) 06/19/2013  . DM type 2 with diabetic peripheral neuropathy 06/19/2013  . BPH (benign prostatic hyperplasia) 06/19/2013  . AKI (acute kidney injury) 07/27/2013  . Urinary retention 08/11/2013  . Sepsis 07/27/2013  . Physical deconditioning 06/19/2013  . Perineal abscess 08/01/2013  . Depression, controlled 06/19/2013  . Anemia of chronic disease 07/31/2013    No past surgical history on file.  VITAL SIGNS BP 136/87  Pulse 74  Ht 5\' 11"  (1.803 m)  Wt 263 lb 8 oz (119.523 kg)  BMI 36.77 kg/m2   Patient's Medications  New Prescriptions   No medications on file  Previous Medications   ACETAMINOPHEN (TYLENOL) 325 MG TABLET    Take 650 mg by mouth every 4 (four) hours as needed for mild pain.   ALBUTEROL (PROVENTIL HFA;VENTOLIN HFA) 108 (90 BASE) MCG/ACT INHALER    Inhale 2 puffs into the lungs every 6 (six) hours as needed for wheezing or shortness of breath.    ALBUTEROL  (PROVENTIL) (2.5 MG/3ML) 0.083% NEBULIZER SOLUTION    Take 2.5 mg by nebulization 2 (two) times daily as needed for wheezing or shortness of breath.    ALLOPURINOL (ZYLOPRIM) 300 MG TABLET    Take 300 mg by mouth 2 (two) times daily.   AMLODIPINE (NORVASC) 10 MG TABLET    Take 10 mg by mouth daily.   BUDESONIDE-FORMOTEROL (SYMBICORT) 160-4.5 MCG/ACT INHALER    Inhale 2 puffs into the lungs 2 (two) times daily.   CITALOPRAM (CELEXA) 40 MG TABLET    Take 40 mg by mouth daily.   CLONAZEPAM (KLONOPIN) 2 MG TABLET    Take 3 mg by mouth at bedtime as needed. Take three tablets by mouth every evening as needed for severe anxiety   CRANBERRY 250 MG CAPS    Take 1 capsule by mouth 2 (two) times daily.   FERROUS SULFATE 325 (65 FE) MG TABLET    Take 325 mg by mouth 3 (three) times daily with meals.   FUROSEMIDE (LASIX) 80 MG TABLET    Take 80 mg by mouth daily.   GABAPENTIN (NEURONTIN) 100 MG CAPSULE    Take 100 mg by mouth 2 (two) times daily.   HYDROCODONE-ACETAMINOPHEN (NORCO/VICODIN) 5-325 MG PER TABLET    Take 1 tablet by mouth every 4 (four) hours as needed for moderate pain.  INSULIN DETEMIR (LEVEMIR) 100 UNIT/ML INJECTION    Inject 0.4 mLs (40 Units total) into the skin daily.   INSULIN LISPRO (HUMALOG) 100 UNIT/ML INJECTION    Inject 2-10 Units into the skin 4 (four) times daily -  before meals and at bedtime. Sliding scale   OLANZAPINE (ZYPREXA) 10 MG TABLET    Take 5 mg by mouth daily.    PANTOPRAZOLE (PROTONIX) 40 MG TABLET    Take 40 mg by mouth 2 (two) times daily.   POLYETHYLENE GLYCOL (MIRALAX / GLYCOLAX) PACKET    Take 17 g by mouth daily.   POTASSIUM CHLORIDE SA (K-DUR,KLOR-CON) 20 MEQ TABLET    Take 20 mEq by mouth daily.   PREDNISONE (DELTASONE) 10 MG TABLET    Take 10 mg by mouth daily with breakfast.   PRIMIDONE (MYSOLINE) 250 MG TABLET    Take 250 mg by mouth 2 (two) times daily.   SENNOSIDES-DOCUSATE SODIUM (SENOKOT-S) 8.6-50 MG TABLET    Take 1 tablet by mouth 2 (two) times daily.     SITAGLIPTIN-METFORMIN (JANUMET) 50-500 MG PER TABLET    Take 1 tablet by mouth 2 (two) times daily with a meal.   TAMSULOSIN (FLOMAX) 0.4 MG CAPS CAPSULE    Take 0.4 mg by mouth daily.   TIOTROPIUM (SPIRIVA) 18 MCG INHALATION CAPSULE    Place 18 mcg into inhaler and inhale daily.   TRAMADOL (ULTRAM) 50 MG TABLET    Take 1 tablet by mouth every 12 hours as needed for pain.   ZOLPIDEM (AMBIEN) 10 MG TABLET    Take 10 mg by mouth at bedtime as needed for sleep.  Modified Medications   No medications on file  Discontinued Medications   No medications on file    SIGNIFICANT DIAGNOSTIC EXAMS  07-26-13: chest x-ray: minimal bibasilar pneumonitis with superimposed mild congestive changes.   08-04-13: kub: bowel gas pattern could be secondary to partial bowel obstruction versus moderate to severe ileus. No abnormal calcifications in the region of the abdomen   08-10-13: ct of pelvis: 4.1 x 2.4 cm left perineal/scrotal abscess. Associated foci of soft tissue gas, correlate for recent intervention.  Additional 4.3 x 2.3 cm left gluteal fluid collection, suspicious  for early/developing abscess.  Associated mild subcutaneous stranding in the left gluteal region,  likely reflecting cellulitis.  Mildly thick-walled bladder, correlate for cystitis.   LAS REVIEWED:   07-10-13: glucose 207; bun 18; creat 1.0; k+ 3.8; na++ 137  07-24-13: glucose 296; bun 24; creat 1.4; k+3.6; na++ 134 07-26-13: wbc 15.5; hgb 9.8; hct 34.1; mcv 85; plt 254; glucose 229; bun 25; creat 1.2; k+3.7;na++ 133 07-31-13: wbc 13.0; hgb 9.2; hct 32.8; mcv 85; plt 267; glucose 136;bun 26; creat 1.0; k+3.9; na++ 137; liver normal albumin 2.7 hgb a1c 7.7  08-02-13: glucose 97; bun 30;creat 1.2; k+4.0; na++ 140;  08-03-13: wbc 26.2; hgb 10.4; hct 35.9; mcv 85.1 plt 289; glucose 158; bun 29;creat 1.2;k+3.9; na++138  08-09-13: wbc 18.3; hgb 10.3; hct 37.6; mcv 86.2 plt 318  08-21-13: wbc 8.8; hgb 10.8; hct 38.3; mcv 85.1;plt 316;  glucose 106; bun 13; creat 0.9; k+3.9; na++138      .Review of Systems  Constitutional: Negative for malaise/fatigue.  HENT: Positive for congestion.     Respiratory: Positive for cough. Negative for shortness of breath and has wheezing.   Cardiovascular: Negative for chest pain, palpitations and leg swelling.  Gastrointestinal: Negative for heartburn, abdominal pain and constipation.  Musculoskeletal: negative for pint pain . Negative for  myalgias.  Skin: has a boil    Neurological: Negative for dizziness, weakness and headaches.  Psychiatric/Behavioral: Negative for depression. The patient is not nervous/anxious and does not have insomnia.     Physical Exam  Constitutional: He is oriented to person, place, and time. He appears well-developed and well-nourished. No distress.  obese  Neck: Neck supple. No JVD present. No thyromegaly present.  Cardiovascular: Normal rate, regular rhythm and intact distal pulses.   Respiratory: Effort normal and breath sounds normal. No respiratory distress. Has rhonchi; wheezes.  GI: Soft. Bowel sounds are normal. He exhibits no distension. There is no tenderness.  Musculoskeletal: Normal range of motion. He exhibits no edema.   Neurological: He is alert and oriented to person, place, and time.  Skin: Skin is warm. He is not diaphoretic. right groin boil has purulent drainage present the area is red and inflamed.  Psychiatric: He has a normal mood and affect.       ASSESSMENT/ PLAN:  1. COPD/sinusitis/right groin boil: will being doxycycline 100 mg twice daily for 10 days with florastor twice daily for 10 days and will monitor his status.

## 2013-10-06 ENCOUNTER — Non-Acute Institutional Stay (SKILLED_NURSING_FACILITY): Payer: Medicare HMO | Admitting: Adult Health

## 2013-10-06 DIAGNOSIS — I150 Renovascular hypertension: Secondary | ICD-10-CM

## 2013-10-06 DIAGNOSIS — N400 Enlarged prostate without lower urinary tract symptoms: Secondary | ICD-10-CM

## 2013-10-06 DIAGNOSIS — G252 Other specified forms of tremor: Secondary | ICD-10-CM

## 2013-10-06 DIAGNOSIS — K5900 Constipation, unspecified: Secondary | ICD-10-CM

## 2013-10-06 DIAGNOSIS — F32A Depression, unspecified: Secondary | ICD-10-CM

## 2013-10-06 DIAGNOSIS — J449 Chronic obstructive pulmonary disease, unspecified: Secondary | ICD-10-CM

## 2013-10-06 DIAGNOSIS — G250 Essential tremor: Secondary | ICD-10-CM

## 2013-10-06 DIAGNOSIS — I739 Peripheral vascular disease, unspecified: Secondary | ICD-10-CM

## 2013-10-06 DIAGNOSIS — F3289 Other specified depressive episodes: Secondary | ICD-10-CM

## 2013-10-06 DIAGNOSIS — D638 Anemia in other chronic diseases classified elsewhere: Secondary | ICD-10-CM

## 2013-10-06 DIAGNOSIS — E1142 Type 2 diabetes mellitus with diabetic polyneuropathy: Secondary | ICD-10-CM

## 2013-10-06 DIAGNOSIS — I509 Heart failure, unspecified: Secondary | ICD-10-CM

## 2013-10-06 DIAGNOSIS — E1149 Type 2 diabetes mellitus with other diabetic neurological complication: Secondary | ICD-10-CM

## 2013-10-06 DIAGNOSIS — J9610 Chronic respiratory failure, unspecified whether with hypoxia or hypercapnia: Secondary | ICD-10-CM

## 2013-10-06 DIAGNOSIS — F329 Major depressive disorder, single episode, unspecified: Secondary | ICD-10-CM

## 2013-10-06 DIAGNOSIS — K219 Gastro-esophageal reflux disease without esophagitis: Secondary | ICD-10-CM

## 2013-10-06 DIAGNOSIS — J4489 Other specified chronic obstructive pulmonary disease: Secondary | ICD-10-CM

## 2013-10-06 DIAGNOSIS — F411 Generalized anxiety disorder: Secondary | ICD-10-CM

## 2013-10-06 DIAGNOSIS — M109 Gout, unspecified: Secondary | ICD-10-CM

## 2013-10-08 ENCOUNTER — Encounter: Payer: Self-pay | Admitting: Adult Health

## 2013-10-08 NOTE — Progress Notes (Signed)
Patient ID: Bill [name], male   DOB: 07-03-1946, 68 y.o.   MRN: [id number]     [facility]  Allergies  Allergen Reactions  . Amoxil [Amoxicillin]     Per MAR  . Penicillins Other (See Comments)    unknown     Chief Complaint  Patient presents with  . Medical Managment of Chronic Issues    HPI:  He is being seen for the management of his chronic illnesses. He is being treated acute sinusitis and right groin boil. He is feeling better. Overall his status is without change in status. There are no concerns being voiced by the nursing staff at this time.    Past Medical History  Diagnosis Date  . COPD (chronic obstructive pulmonary disease)   . Diabetes mellitus without complication   . Hyperlipidemia   . Depression   . CHF (congestive heart failure)   . Gout   . OSA (obstructive sleep apnea)   . Pneumonia 07/26/2013  . CHF with unknown LVEF 08/11/2013  . Chronic respiratory failure 06/19/2013  . GERD (gastroesophageal reflux disease) 06/19/2013  . DM type 2 with diabetic peripheral neuropathy 06/19/2013  . BPH (benign prostatic hyperplasia) 06/19/2013  . AKI (acute kidney injury) 07/27/2013  . Urinary retention 08/11/2013  . Sepsis 07/27/2013  . Physical deconditioning 06/19/2013  . Perineal abscess 08/01/2013  . Depression, controlled 06/19/2013  . Anemia of chronic disease 07/31/2013    No past surgical history on file.  VITAL SIGNS BP 116/59  Pulse 89  Ht 5\' 11"  (1.803 m)  Wt 263 lb 8 oz (119.523 kg)  BMI 36.77 kg/m2   Patient's Medications  New Prescriptions   No medications on file  Previous Medications   ACETAMINOPHEN (TYLENOL) 325 MG TABLET    Take 650 mg by mouth every 4 (four) hours as needed for mild pain.   ALBUTEROL (PROVENTIL HFA;VENTOLIN HFA) 108 (90 BASE) MCG/ACT INHALER    Inhale 2 puffs into the lungs every 6 (six) hours as needed for wheezing or shortness of breath.    ALBUTEROL (PROVENTIL) (2.5 MG/3ML) 0.083% NEBULIZER SOLUTION    Take  2.5 mg by nebulization 2 (two) times daily as needed for wheezing or shortness of breath.    ALLOPURINOL (ZYLOPRIM) 300 MG TABLET    Take 300 mg by mouth 2 (two) times daily.   AMLODIPINE (NORVASC) 10 MG TABLET    Take 10 mg by mouth daily.   BUDESONIDE-FORMOTEROL (SYMBICORT) 160-4.5 MCG/ACT INHALER    Inhale 2 puffs into the lungs 2 (two) times daily.   CITALOPRAM (CELEXA) 40 MG TABLET    Take 40 mg by mouth daily.   CLONAZEPAM (KLONOPIN) 2 MG TABLET    Take 3 mg by mouth at bedtime as needed. Take three tablets by mouth every evening as needed for severe anxiety   CRANBERRY 250 MG CAPS    Take 1 capsule by mouth 2 (two) times daily.   FERROUS SULFATE 325 (65 FE) MG TABLET    Take 325 mg by mouth 3 (three) times daily with meals.   FUROSEMIDE (LASIX) 80 MG TABLET    Take 80 mg by mouth daily.   GABAPENTIN (NEURONTIN) 100 MG CAPSULE    Take 100 mg by mouth 2 (two) times daily.   HYDROCODONE-ACETAMINOPHEN (NORCO/VICODIN) 5-325 MG PER TABLET    Take 1 tablet by mouth every 4 (four) hours as needed for moderate pain.   INSULIN DETEMIR (LEVEMIR) 100 UNIT/ML INJECTION    Inject 0.4 mLs (  40 Units total) into the skin daily.   INSULIN LISPRO (HUMALOG) 100 UNIT/ML INJECTION    Inject 2-10 Units into the skin 4 (four) times daily -  before meals and at bedtime. Sliding scale   OLANZAPINE (ZYPREXA) 10 MG TABLET    Take 5 mg by mouth daily.    PANTOPRAZOLE (PROTONIX) 40 MG TABLET    Take 40 mg by mouth 2 (two) times daily.   POLYETHYLENE GLYCOL (MIRALAX / GLYCOLAX) PACKET    Take 17 g by mouth daily.   POTASSIUM CHLORIDE SA (K-DUR,KLOR-CON) 20 MEQ TABLET    Take 20 mEq by mouth daily.   PREDNISONE (DELTASONE) 10 MG TABLET    Take 10 mg by mouth daily with breakfast.   PRIMIDONE (MYSOLINE) 250 MG TABLET    Take 250 mg by mouth 2 (two) times daily.   SENNOSIDES-DOCUSATE SODIUM (SENOKOT-S) 8.6-50 MG TABLET    Take 1 tablet by mouth 2 (two) times daily.   SITAGLIPTIN-METFORMIN (JANUMET) 50-500 MG PER TABLET     Take 1 tablet by mouth 2 (two) times daily with a meal.   TAMSULOSIN (FLOMAX) 0.4 MG CAPS CAPSULE    Take 0.4 mg by mouth daily.   TIOTROPIUM (SPIRIVA) 18 MCG INHALATION CAPSULE    Place 18 mcg into inhaler and inhale daily.   TRAMADOL (ULTRAM) 50 MG TABLET    Take 1 tablet by mouth every 12 hours as needed for pain.   ZOLPIDEM (AMBIEN) 10 MG TABLET    Take 10 mg by mouth at bedtime as needed for sleep.  Modified Medications   No medications on file  Discontinued Medications   No medications on file    SIGNIFICANT DIAGNOSTIC EXAMS  07-26-13: chest x-ray: minimal bibasilar pneumonitis with superimposed mild congestive changes.   08-04-13: kub: bowel gas pattern could be secondary to partial bowel obstruction versus moderate to severe ileus. No abnormal calcifications in the region of the abdomen   08-10-13: ct of pelvis: 4.1 x 2.4 cm left perineal/scrotal abscess. Associated foci of soft tissue gas, correlate for recent intervention.  Additional 4.3 x 2.3 cm left gluteal fluid collection, suspicious  for early/developing abscess.  Associated mild subcutaneous stranding in the left gluteal region,  likely reflecting cellulitis.  Mildly thick-walled bladder, correlate for cystitis.   LAS REVIEWED:   07-10-13: glucose 207; bun 18; creat 1.0; k+ 3.8; na++ 137  07-24-13: glucose 296; bun 24; creat 1.4; k+3.6; na++ 134 07-26-13: wbc 15.5; hgb 9.8; hct 34.1; mcv 85; plt 254; glucose 229; bun 25; creat 1.2; k+3.7;na++ 133 07-31-13: wbc 13.0; hgb 9.2; hct 32.8; mcv 85; plt 267; glucose 136;bun 26; creat 1.0; k+3.9; na++ 137; liver normal albumin 2.7 hgb a1c 7.7  08-02-13: glucose 97; bun 30;creat 1.2; k+4.0; na++ 140;  08-03-13: wbc 26.2; hgb 10.4; hct 35.9; mcv 85.1 plt 289; glucose 158; bun 29;creat 1.2;k+3.9; na++138  08-09-13: wbc 18.3; hgb 10.3; hct 37.6; mcv 86.2 plt 318  08-21-13: wbc 8.8; hgb 10.8; hct 38.3; mcv 85.1;plt 316; glucose 106; bun 13; creat 0.9; k+3.9; na++138      .Review  of Systems  Constitutional: Negative for malaise/fatigue.  HENT: Positive for congestion.     Respiratory: Positive for cough. Negative for shortness of breath and has wheezing.   Cardiovascular: Negative for chest pain, palpitations and leg swelling.  Gastrointestinal: Negative for heartburn, abdominal pain and constipation.  Musculoskeletal: negative for pint pain . Negative for myalgias.  Skin: has a boil    Neurological: Negative for dizziness,  weakness and headaches.  Psychiatric/Behavioral: Negative for depression. The patient is not nervous/anxious and does not have insomnia.     Physical Exam  Constitutional: He is oriented to person, place, and time. He appears well-developed and well-nourished. No distress.  obese  Neck: Neck supple. No JVD present. No thyromegaly present.  Cardiovascular: Normal rate, regular rhythm and intact distal pulses.   Respiratory: Effort normal and breath sounds normal. No respiratory distress. Has rhonchi; wheezes.  GI: Soft. Bowel sounds are normal. He exhibits no distension. There is no tenderness.  Musculoskeletal: Normal range of motion. He exhibits no edema.   Neurological: He is alert and oriented to person, place, and time.  Skin: Skin is warm. He is not diaphoretic. right groin boil has purulent drainage present the area is red and inflamed.  Psychiatric: He has a normal mood and affect.      ASSESSMENT/ PLAN:  1. Copd: is presently stable; will continue prednisone 10 mg daily; symbicort 160/4.5 2 puffs twice daily chronic 02 use; spiriva daily  albuterol 2 puffs four times daily as needed.  He is presently being treated for acute sinusitis   2. Chf: is stable will continue 1800 cc fluid restriction; lasix 80 mg daily with k+ 20 meq daily;  Will monitor  3. Hypertension: will continue norvasc 10 mg daily will monitor   4. Diabetes; is stable will continue levemir 40 units daily; janumet 50/500 mg twice daily; humalog 5 units prior to  meals for cbg >=150   5. Gout: no recent flares; will continue allopurinol 300 mg twice daily and colchicine 0.6 mg daily as needed   6. Anemia; will continue iron three times daily  7. Tremor: is stable will continue primidone 250 mg twice daily   8. Gerd: will continue protonix 40 mg twice daily   9. Bhp: will continue flomax 0.4 mg daily   10. Constipation: will continue miralax daily and senna s twice daily   11. Peripheral neuropathy: no complaints of pain present: will continue neurontin 100 mg twice daily has ultram 50 mg every 12 hours as needed for pain; and has vicodin 5/325 mg every 4 hours sa needed for pain.   12. Depression/anxiety: is stable will continue celexa 40 mg daily; zyprexa 5 mg nightly; and klonopin 3 mg nightly as needed. Has ambien 10 mg nightly as needed

## 2013-10-19 ENCOUNTER — Non-Acute Institutional Stay (SKILLED_NURSING_FACILITY): Payer: Medicare HMO | Admitting: Adult Health

## 2013-10-19 ENCOUNTER — Encounter: Payer: Self-pay | Admitting: Adult Health

## 2013-10-19 DIAGNOSIS — I150 Renovascular hypertension: Secondary | ICD-10-CM

## 2013-10-19 NOTE — Progress Notes (Signed)
Patient ID: Bill [name], male   DOB: 12-12-1945, 68 y.o.   MRN: [id number]     [facility]  Allergies  Allergen Reactions  . Amoxil [Amoxicillin]     Per MAR  . Penicillins Other (See Comments)    unknown     Chief Complaint  Patient presents with  . Acute Visit    hypertension    HPI:  His blood pressure has been elevated. His last reading is 161/75. There is a question of him gaining 9 pounds in one day. He is not voicing any complaints of chest pain; shortness of breath or activity intolerance at this time. This may be an aberrant weight; will continue to weigh him daily; and will address as indicated.   Past Medical History  Diagnosis Date  . COPD (chronic obstructive pulmonary disease)   . Diabetes mellitus without complication   . Hyperlipidemia   . Depression   . CHF (congestive heart failure)   . Gout   . OSA (obstructive sleep apnea)   . Pneumonia 07/26/2013  . CHF with unknown LVEF 08/11/2013  . Chronic respiratory failure 06/19/2013  . GERD (gastroesophageal reflux disease) 06/19/2013  . DM type 2 with diabetic peripheral neuropathy 06/19/2013  . BPH (benign prostatic hyperplasia) 06/19/2013  . AKI (acute kidney injury) 07/27/2013  . Urinary retention 08/11/2013  . Sepsis 07/27/2013  . Physical deconditioning 06/19/2013  . Perineal abscess 08/01/2013  . Depression, controlled 06/19/2013  . Anemia of chronic disease 07/31/2013    No past surgical history on file.  VITAL SIGNS BP 161/75  Pulse 81  Ht 5\' 11"  (1.803 m)  Wt 264 lb 12.8 oz (120.112 kg)  BMI 36.95 kg/m2   Patient's Medications  New Prescriptions   No medications on file  Previous Medications   ACETAMINOPHEN (TYLENOL) 325 MG TABLET    Take 650 mg by mouth every 4 (four) hours as needed for mild pain.   ALBUTEROL (PROVENTIL HFA;VENTOLIN HFA) 108 (90 BASE) MCG/ACT INHALER    Inhale 2 puffs into the lungs every 6 (six) hours as needed for wheezing or shortness of breath.    ALBUTEROL  (PROVENTIL) (2.5 MG/3ML) 0.083% NEBULIZER SOLUTION    Take 2.5 mg by nebulization 2 (two) times daily as needed for wheezing or shortness of breath.    ALLOPURINOL (ZYLOPRIM) 300 MG TABLET    Take 300 mg by mouth 2 (two) times daily.   AMLODIPINE (NORVASC) 10 MG TABLET    Take 10 mg by mouth daily.   BUDESONIDE-FORMOTEROL (SYMBICORT) 160-4.5 MCG/ACT INHALER    Inhale 2 puffs into the lungs 2 (two) times daily.   CITALOPRAM (CELEXA) 40 MG TABLET    Take 40 mg by mouth daily.   CLONAZEPAM (KLONOPIN) 2 MG TABLET    Take 1 mg by mouth 2 (two) times daily as needed. Take three tablets by mouth every evening as needed for severe anxiety   CRANBERRY 250 MG CAPS    Take 1 capsule by mouth 2 (two) times daily.   FERROUS SULFATE 325 (65 FE) MG TABLET    Take 325 mg by mouth 3 (three) times daily with meals.   FUROSEMIDE (LASIX) 80 MG TABLET    Take 80 mg by mouth daily.   GABAPENTIN (NEURONTIN) 100 MG CAPSULE    Take 100 mg by mouth 2 (two) times daily.   HYDROCODONE-ACETAMINOPHEN (NORCO/VICODIN) 5-325 MG PER TABLET    Take 1 tablet by mouth every 4 (four) hours as needed for moderate pain.  INSULIN DETEMIR (LEVEMIR) 100 UNIT/ML INJECTION    Inject 0.4 mLs (40 Units total) into the skin daily.   INSULIN LISPRO (HUMALOG) 100 UNIT/ML INJECTION    Inject 2-10 Units into the skin 4 (four) times daily -  before meals and at bedtime. Sliding scale   OLANZAPINE (ZYPREXA) 10 MG TABLET    Take 5 mg by mouth daily.    PANTOPRAZOLE (PROTONIX) 40 MG TABLET    Take 40 mg by mouth 2 (two) times daily.   POLYETHYLENE GLYCOL (MIRALAX / GLYCOLAX) PACKET    Take 17 g by mouth daily.   POTASSIUM CHLORIDE SA (K-DUR,KLOR-CON) 20 MEQ TABLET    Take 20 mEq by mouth daily.   PREDNISONE (DELTASONE) 10 MG TABLET    Take 10 mg by mouth daily with breakfast.   PRIMIDONE (MYSOLINE) 250 MG TABLET    Take 250 mg by mouth 2 (two) times daily.   SENNOSIDES-DOCUSATE SODIUM (SENOKOT-S) 8.6-50 MG TABLET    Take 1 tablet by mouth 2 (two)  times daily.   SITAGLIPTIN-METFORMIN (JANUMET) 50-500 MG PER TABLET    Take 1 tablet by mouth 2 (two) times daily with a meal.   TAMSULOSIN (FLOMAX) 0.4 MG CAPS CAPSULE    Take 0.4 mg by mouth daily.   TIOTROPIUM (SPIRIVA) 18 MCG INHALATION CAPSULE    Place 18 mcg into inhaler and inhale daily.   TRAMADOL (ULTRAM) 50 MG TABLET    Take 1 tablet by mouth every 12 hours as needed for pain.   ZOLPIDEM (AMBIEN) 10 MG TABLET    Take 10 mg by mouth at bedtime as needed for sleep.  Modified Medications   No medications on file  Discontinued Medications   No medications on file    SIGNIFICANT DIAGNOSTIC EXAMS  07-26-13: chest x-ray: minimal bibasilar pneumonitis with superimposed mild congestive changes.   08-04-13: kub: bowel gas pattern could be secondary to partial bowel obstruction versus moderate to severe ileus. No abnormal calcifications in the region of the abdomen   08-10-13: ct of pelvis: 4.1 x 2.4 cm left perineal/scrotal abscess. Associated foci of soft tissue gas, correlate for recent intervention.  Additional 4.3 x 2.3 cm left gluteal fluid collection, suspicious  for early/developing abscess.  Associated mild subcutaneous stranding in the left gluteal region,  likely reflecting cellulitis.  Mildly thick-walled bladder, correlate for cystitis.   LAS REVIEWED:   07-10-13: glucose 207; bun 18; creat 1.0; k+ 3.8; na++ 137  07-24-13: glucose 296; bun 24; creat 1.4; k+3.6; na++ 134 07-26-13: wbc 15.5; hgb 9.8; hct 34.1; mcv 85; plt 254; glucose 229; bun 25; creat 1.2; k+3.7;na++ 133 07-31-13: wbc 13.0; hgb 9.2; hct 32.8; mcv 85; plt 267; glucose 136;bun 26; creat 1.0; k+3.9; na++ 137; liver normal albumin 2.7 hgb a1c 7.7  08-02-13: glucose 97; bun 30;creat 1.2; k+4.0; na++ 140;  08-03-13: wbc 26.2; hgb 10.4; hct 35.9; mcv 85.1 plt 289; glucose 158; bun 29;creat 1.2;k+3.9; na++138  08-09-13: wbc 18.3; hgb 10.3; hct 37.6; mcv 86.2 plt 318  08-21-13: wbc 8.8; hgb 10.8; hct 38.3; mcv  85.1;plt 316; glucose 106; bun 13; creat 0.9; k+3.9; na++138      .Review of Systems  Constitutional: Negative for malaise/fatigue.  HENT: negative for congestion      Respiratory: Positive for cough. Negative for shortness of breath and wheezing Cardiovascular: Negative for chest pain, palpitations and leg swelling.  Gastrointestinal: Negative for heartburn, abdominal pain and constipation.  Musculoskeletal: negative for pint pain . Negative for myalgias.  Skin:  negative   Neurological: Negative for dizziness, weakness and headaches.  Psychiatric/Behavioral: Negative for depression. The patient is not nervous/anxious and does not have insomnia.     Physical Exam  Constitutional: He is oriented to person, place, and time. He appears well-developed and well-nourished. No distress.  obese  Neck: Neck supple. No JVD present. No thyromegaly present.  Cardiovascular: Normal rate, regular rhythm and intact distal pulses.   Respiratory: Effort normal and breath sounds normal. No respiratory distress. Has rhonchi; wheezes.  GI: Soft. Bowel sounds are normal. He exhibits no distension. There is no tenderness.  Musculoskeletal: Normal range of motion. He exhibits no edema.   Neurological: He is alert and oriented to person, place, and time.  Skin: Skin is warm. He is not diaphoretic.  Psychiatric: He has a normal mood and affect.       ASSESSMENT/ PLAN:  1. Hypertension: is worse; will add lisinopril 10 mg daily; will have nursing check his blood pressure every shift and will check bmp in 2 weeks.      Synthia Innocent NP [facility]  Contact [phone] Monday through Friday 8am- 5pm  After hours call [phone]

## 2013-10-20 ENCOUNTER — Non-Acute Institutional Stay (SKILLED_NURSING_FACILITY): Payer: Medicare HMO | Admitting: Adult Health

## 2013-10-20 DIAGNOSIS — I509 Heart failure, unspecified: Secondary | ICD-10-CM

## 2013-10-22 ENCOUNTER — Encounter: Payer: Self-pay | Admitting: Adult Health

## 2013-10-22 MED ORDER — FLUTICASONE PROPIONATE 50 MCG/ACT NA SUSP: 2.0000 | Freq: Every day | NASAL | Status: AC

## 2013-10-22 MED ORDER — FUROSEMIDE 80 MG PO TABS: 80.0000 mg | ORAL_TABLET | Freq: Two times a day (BID) | ORAL | Status: DC

## 2013-10-22 MED ORDER — LISINOPRIL 10 MG PO TABS: 10.0000 mg | ORAL_TABLET | Freq: Every day | ORAL | Status: DC

## 2013-10-22 NOTE — Progress Notes (Signed)
Patient ID: Bill [name], male   DOB: 1945/09/14, 69 y.o.   MRN: [id number]     [facility]  Allergies  Allergen Reactions  . Amoxil [Amoxicillin]     Per MAR  . Penicillins Other (See Comments)    unknown    Chief Complaint  Patient presents with  . Acute Visit    follow up weight gain     HPI:  He is continuing to gain weight. The weight yesterday was not aberrant today's weight is 284 pounds. He has been steadily gaining from the 260's. Today he is complaining of increased shortness of breath and is complaining of increased edema with feeling as though he is full of water.    Past Medical History  Diagnosis Date  . COPD (chronic obstructive pulmonary disease)   . Diabetes mellitus without complication   . Hyperlipidemia   . Depression   . CHF (congestive heart failure)   . Gout   . OSA (obstructive sleep apnea)   . Pneumonia 07/26/2013  . CHF with unknown LVEF 08/11/2013  . Chronic respiratory failure 06/19/2013  . GERD (gastroesophageal reflux disease) 06/19/2013  . DM type 2 with diabetic peripheral neuropathy 06/19/2013  . BPH (benign prostatic hyperplasia) 06/19/2013  . AKI (acute kidney injury) 07/27/2013  . Urinary retention 08/11/2013  . Sepsis 07/27/2013  . Physical deconditioning 06/19/2013  . Perineal abscess 08/01/2013  . Depression, controlled 06/19/2013  . Anemia of chronic disease 07/31/2013    No past surgical history on file.  VITAL SIGNS BP 139/80  Pulse 80  Ht 5\' 11"  (1.803 m)  Wt 284 lb (128.822 kg)  BMI 39.63 kg/m2   Patient's Medications  New Prescriptions   No medications on file  Previous Medications   ACETAMINOPHEN (TYLENOL) 325 MG TABLET    Take 650 mg by mouth every 4 (four) hours as needed for mild pain.   ALBUTEROL (PROVENTIL HFA;VENTOLIN HFA) 108 (90 BASE) MCG/ACT INHALER    Inhale 2 puffs into the lungs every 6 (six) hours as needed for wheezing or shortness of breath.    ALBUTEROL (PROVENTIL) (2.5 MG/3ML) 0.083%  NEBULIZER SOLUTION    Take 2.5 mg by nebulization 2 (two) times daily as needed for wheezing or shortness of breath.    ALLOPURINOL (ZYLOPRIM) 300 MG TABLET    Take 300 mg by mouth 2 (two) times daily.   AMLODIPINE (NORVASC) 10 MG TABLET    Take 10 mg by mouth daily.   BUDESONIDE-FORMOTEROL (SYMBICORT) 160-4.5 MCG/ACT INHALER    Inhale 2 puffs into the lungs 2 (two) times daily.   CITALOPRAM (CELEXA) 40 MG TABLET    Take 40 mg by mouth daily.   CLONAZEPAM (KLONOPIN) 2 MG TABLET    Take 1 mg by mouth 2 (two) times daily as needed. Take three tablets by mouth every evening as needed for severe anxiety   CRANBERRY 250 MG CAPS    Take 1 capsule by mouth 2 (two) times daily.   FERROUS SULFATE 325 (65 FE) MG TABLET    Take 325 mg by mouth 3 (three) times daily with meals.   FUROSEMIDE (LASIX) 80 MG TABLET    Take 80 mg by mouth daily.   GABAPENTIN (NEURONTIN) 100 MG CAPSULE    Take 100 mg by mouth 2 (two) times daily.   HYDROCODONE-ACETAMINOPHEN (NORCO/VICODIN) 5-325 MG PER TABLET    Take 1 tablet by mouth every 4 (four) hours as needed for moderate pain.   INSULIN DETEMIR (LEVEMIR) 100 UNIT/ML  INJECTION    Inject 0.4 mLs (40 Units total) into the skin daily.   INSULIN LISPRO (HUMALOG) 100 UNIT/ML INJECTION    Inject 2-10 Units into the skin 4 (four) times daily -  before meals and at bedtime. Sliding scale   LISINOPRIL (PRINIVIL,ZESTRIL) 10 MG TABLET    Take 1 tablet (10 mg total) by mouth daily.   OLANZAPINE (ZYPREXA) 10 MG TABLET    Take 5 mg by mouth daily.    PANTOPRAZOLE (PROTONIX) 40 MG TABLET    Take 40 mg by mouth 2 (two) times daily.   POLYETHYLENE GLYCOL (MIRALAX / GLYCOLAX) PACKET    Take 17 g by mouth daily.   POTASSIUM CHLORIDE SA (K-DUR,KLOR-CON) 20 MEQ TABLET    Take 20 mEq by mouth daily.   PREDNISONE (DELTASONE) 10 MG TABLET    Take 10 mg by mouth daily with breakfast.   PRIMIDONE (MYSOLINE) 250 MG TABLET    Take 250 mg by mouth 2 (two) times daily.   SENNOSIDES-DOCUSATE SODIUM  (SENOKOT-S) 8.6-50 MG TABLET    Take 1 tablet by mouth 2 (two) times daily.   SITAGLIPTIN-METFORMIN (JANUMET) 50-500 MG PER TABLET    Take 1 tablet by mouth 2 (two) times daily with a meal.   TAMSULOSIN (FLOMAX) 0.4 MG CAPS CAPSULE    Take 0.4 mg by mouth daily.   TIOTROPIUM (SPIRIVA) 18 MCG INHALATION CAPSULE    Place 18 mcg into inhaler and inhale daily.   TRAMADOL (ULTRAM) 50 MG TABLET    Take 1 tablet by mouth every 12 hours as needed for pain.   ZOLPIDEM (AMBIEN) 10 MG TABLET    Take 10 mg by mouth at bedtime as needed for sleep.  Modified Medications   No medications on file  Discontinued Medications   No medications on file    SIGNIFICANT DIAGNOSTIC EXAMS  07-26-13: chest x-ray: minimal bibasilar pneumonitis with superimposed mild congestive changes.   08-04-13: kub: bowel gas pattern could be secondary to partial bowel obstruction versus moderate to severe ileus. No abnormal calcifications in the region of the abdomen   08-10-13: ct of pelvis: 4.1 x 2.4 cm left perineal/scrotal abscess. Associated foci of soft tissue gas, correlate for recent intervention.  Additional 4.3 x 2.3 cm left gluteal fluid collection, suspicious  for early/developing abscess.  Associated mild subcutaneous stranding in the left gluteal region,  likely reflecting cellulitis.  Mildly thick-walled bladder, correlate for cystitis.   LAS REVIEWED:   07-10-13: glucose 207; bun 18; creat 1.0; k+ 3.8; na++ 137  07-24-13: glucose 296; bun 24; creat 1.4; k+3.6; na++ 134 07-26-13: wbc 15.5; hgb 9.8; hct 34.1; mcv 85; plt 254; glucose 229; bun 25; creat 1.2; k+3.7;na++ 133 07-31-13: wbc 13.0; hgb 9.2; hct 32.8; mcv 85; plt 267; glucose 136;bun 26; creat 1.0; k+3.9; na++ 137; liver normal albumin 2.7 hgb a1c 7.7  08-02-13: glucose 97; bun 30;creat 1.2; k+4.0; na++ 140;  08-03-13: wbc 26.2; hgb 10.4; hct 35.9; mcv 85.1 plt 289; glucose 158; bun 29;creat 1.2;k+3.9; na++138  08-09-13: wbc 18.3; hgb 10.3; hct 37.6; mcv  86.2 plt 318  08-21-13: wbc 8.8; hgb 10.8; hct 38.3; mcv 85.1;plt 316; glucose 106; bun 13; creat 0.9; k+3.9; na++138      .Review of Systems  Constitutional: Negative for malaise/fatigue.  HENT: negative for congestion      Respiratory: Positive for cough. HAS  shortness of breath and wheezing Cardiovascular: Negative for chest pain, palpitations HAS EDEMA   Gastrointestinal: Negative for heartburn, abdominal pain  and constipation.  Musculoskeletal: negative for pint pain . Negative for myalgias.  Skin: negative   Neurological: Negative for dizziness, weakness and headaches.  Psychiatric/Behavioral: Negative for depression. The patient is not nervous/anxious and does not have insomnia.     Physical Exam  Constitutional: He is oriented to person, place, and time. He appears well-developed and well-nourished. No distress.  obese  Neck: Neck supple. No JVD present. No thyromegaly present.  Cardiovascular: Normal rate, regular rhythm and intact distal pulses.   Respiratory: Effort normal and breath sounds normal. No respiratory distress. Has rhonchi; wheezes.  GI: Soft. Bowel sounds are normal. He exhibits no distension. There is no tenderness.  Musculoskeletal: Normal range of motion. Has 2+ edema present .   Neurological: He is alert and oriented to person, place, and time.  Skin: Skin is warm. He is not diaphoretic.  Psychiatric: He has a normal mood and affect.      ASSESSMENT/ PLAN:  1.  CHF: is worse; he continues to gain weight; will increase his lasix to 80 mg twice daily; will continue his daily weights; on Monday will check BNP and on 10-26-13 will check bmp; and will monitor his status.      Synthia Innocenteborah Green NP [facility]  Contact [phone] Monday through Friday 8am- 5pm  After hours call [phone]

## 2013-10-25 ENCOUNTER — Non-Acute Institutional Stay (SKILLED_NURSING_FACILITY): Payer: Medicare HMO | Admitting: Adult Health

## 2013-10-25 DIAGNOSIS — I509 Heart failure, unspecified: Secondary | ICD-10-CM

## 2013-10-29 ENCOUNTER — Encounter: Payer: Self-pay | Admitting: Adult Health

## 2013-10-29 MED ORDER — METOLAZONE 5 MG PO TABS: 5.0000 mg | ORAL_TABLET | Freq: Every day | ORAL | Status: DC

## 2013-10-29 NOTE — Progress Notes (Signed)
Patient ID: Bill [name], male   DOB: 17-Sep-1945, 68 y.o.   MRN: [id number]     [facility]  Allergies  Allergen Reactions  . Amoxil [Amoxicillin]     Per MAR  . Penicillins Other (See Comments)    unknown      Chief Complaint  Patient presents with  . Acute Visit    weight gain    HPI:  He is continuing to gain weight; his current weight is 288.2 pounds; on 10-22-13 his weight was 276.1 pounds. He continues on a 1800 cc fluid restriction. He has difficulty adhering to this fluid restriction per the nursing staff. He does eat a lot of salty snacks out of the vending machine during the day time. He states that he is not feeling worse; but is not feeling better.    Past Medical History  Diagnosis Date  . COPD (chronic obstructive pulmonary disease)   . Diabetes mellitus without complication   . Hyperlipidemia   . Depression   . CHF (congestive heart failure)   . Gout   . OSA (obstructive sleep apnea)   . Pneumonia 07/26/2013  . CHF with unknown LVEF 08/11/2013  . Chronic respiratory failure 06/19/2013  . GERD (gastroesophageal reflux disease) 06/19/2013  . DM type 2 with diabetic peripheral neuropathy 06/19/2013  . BPH (benign prostatic hyperplasia) 06/19/2013  . AKI (acute kidney injury) 07/27/2013  . Urinary retention 08/11/2013  . Sepsis 07/27/2013  . Physical deconditioning 06/19/2013  . Perineal abscess 08/01/2013  . Depression, controlled 06/19/2013  . Anemia of chronic disease 07/31/2013    No past surgical history on file.  VITAL SIGNS BP 129/87  Pulse 70  Ht 5\' 11"  (1.803 m)  Wt 288 lb 3.2 oz (130.727 kg)  BMI 40.21 kg/m2   Patient's Medications  New Prescriptions   No medications on file  Previous Medications   ACETAMINOPHEN (TYLENOL) 325 MG TABLET    Take 650 mg by mouth every 4 (four) hours as needed for mild pain.   ALBUTEROL (PROVENTIL HFA;VENTOLIN HFA) 108 (90 BASE) MCG/ACT INHALER    Inhale 2 puffs into the lungs every 6 (six) hours as  needed for wheezing or shortness of breath.    ALBUTEROL (PROVENTIL) (2.5 MG/3ML) 0.083% NEBULIZER SOLUTION    Take 2.5 mg by nebulization 2 (two) times daily as needed for wheezing or shortness of breath.    ALLOPURINOL (ZYLOPRIM) 300 MG TABLET    Take 300 mg by mouth 2 (two) times daily.   AMLODIPINE (NORVASC) 10 MG TABLET    Take 10 mg by mouth daily.   BUDESONIDE-FORMOTEROL (SYMBICORT) 160-4.5 MCG/ACT INHALER    Inhale 2 puffs into the lungs 2 (two) times daily.   CITALOPRAM (CELEXA) 40 MG TABLET    Take 40 mg by mouth daily.   CLONAZEPAM (KLONOPIN) 2 MG TABLET    Take 1 mg by mouth 2 (two) times daily as needed. Take three tablets by mouth every evening as needed for severe anxiety   CRANBERRY 250 MG CAPS    Take 1 capsule by mouth 2 (two) times daily.   FERROUS SULFATE 325 (65 FE) MG TABLET    Take 325 mg by mouth 3 (three) times daily with meals.   FLUTICASONE (FLONASE) 50 MCG/ACT NASAL SPRAY    Place 2 sprays into both nostrils daily.   FUROSEMIDE (LASIX) 80 MG TABLET    Take 1 tablet (80 mg total) by mouth 2 (two) times daily.   GABAPENTIN (NEURONTIN) 100 MG  CAPSULE    Take 100 mg by mouth 2 (two) times daily.   HYDROCODONE-ACETAMINOPHEN (NORCO/VICODIN) 5-325 MG PER TABLET    Take 1 tablet by mouth every 4 (four) hours as needed for moderate pain.   INSULIN DETEMIR (LEVEMIR) 100 UNIT/ML INJECTION    Inject 0.4 mLs (40 Units total) into the skin daily.   INSULIN LISPRO (HUMALOG) 100 UNIT/ML INJECTION    Inject 2-10 Units into the skin 4 (four) times daily -  before meals and at bedtime. Sliding scale   LISINOPRIL (PRINIVIL,ZESTRIL) 10 MG TABLET    Take 1 tablet (10 mg total) by mouth daily.   OLANZAPINE (ZYPREXA) 10 MG TABLET    Take 5 mg by mouth daily.    PANTOPRAZOLE (PROTONIX) 40 MG TABLET    Take 40 mg by mouth 2 (two) times daily.   POLYETHYLENE GLYCOL (MIRALAX / GLYCOLAX) PACKET    Take 17 g by mouth daily.   POTASSIUM CHLORIDE SA (K-DUR,KLOR-CON) 20 MEQ TABLET    Take 20 mEq by  mouth daily.   PREDNISONE (DELTASONE) 10 MG TABLET    Take 10 mg by mouth daily with breakfast.   PRIMIDONE (MYSOLINE) 250 MG TABLET    Take 250 mg by mouth 2 (two) times daily.   SENNOSIDES-DOCUSATE SODIUM (SENOKOT-S) 8.6-50 MG TABLET    Take 1 tablet by mouth 2 (two) times daily.   SITAGLIPTIN-METFORMIN (JANUMET) 50-500 MG PER TABLET    Take 1 tablet by mouth 2 (two) times daily with a meal.   TAMSULOSIN (FLOMAX) 0.4 MG CAPS CAPSULE    Take 0.4 mg by mouth daily.   TIOTROPIUM (SPIRIVA) 18 MCG INHALATION CAPSULE    Place 18 mcg into inhaler and inhale daily.   TRAMADOL (ULTRAM) 50 MG TABLET    Take 1 tablet by mouth every 12 hours as needed for pain.   ZOLPIDEM (AMBIEN) 10 MG TABLET    Take 10 mg by mouth at bedtime as needed for sleep.  Modified Medications   No medications on file  Discontinued Medications   No medications on file    SIGNIFICANT DIAGNOSTIC EXAMS  07-26-13: chest x-ray: minimal bibasilar pneumonitis with superimposed mild congestive changes.   08-04-13: kub: bowel gas pattern could be secondary to partial bowel obstruction versus moderate to severe ileus. No abnormal calcifications in the region of the abdomen   08-10-13: ct of pelvis: 4.1 x 2.4 cm left perineal/scrotal abscess. Associated foci of soft tissue gas, correlate for recent intervention.  Additional 4.3 x 2.3 cm left gluteal fluid collection, suspicious  for early/developing abscess.  Associated mild subcutaneous stranding in the left gluteal region,  likely reflecting cellulitis.  Mildly thick-walled bladder, correlate for cystitis.   LAS REVIEWED:   07-10-13: glucose 207; bun 18; creat 1.0; k+ 3.8; na++ 137  07-24-13: glucose 296; bun 24; creat 1.4; k+3.6; na++ 134 07-26-13: wbc 15.5; hgb 9.8; hct 34.1; mcv 85; plt 254; glucose 229; bun 25; creat 1.2; k+3.7;na++ 133 07-31-13: wbc 13.0; hgb 9.2; hct 32.8; mcv 85; plt 267; glucose 136;bun 26; creat 1.0; k+3.9; na++ 137; liver normal albumin 2.7 hgb a1c 7.7    08-02-13: glucose 97; bun 30;creat 1.2; k+4.0; na++ 140;  08-03-13: wbc 26.2; hgb 10.4; hct 35.9; mcv 85.1 plt 289; glucose 158; bun 29;creat 1.2;k+3.9; na++138  08-09-13: wbc 18.3; hgb 10.3; hct 37.6; mcv 86.2 plt 318  08-21-13: wbc 8.8; hgb 10.8; hct 38.3; mcv 85.1;plt 316; glucose 106; bun 13; creat 0.9; k+3.9; na++138 10-23-13: BNP: 17       .  Review of Systems  Constitutional: Negative for malaise/fatigue.  HENT: negative for congestion      Respiratory: Positive for cough. HAS  shortness of breath and wheezing Cardiovascular: Negative for chest pain, palpitations HAS EDEMA   Gastrointestinal: Negative for heartburn, abdominal pain and constipation.  Musculoskeletal: negative for pint pain . Negative for myalgias.  Skin: negative   Neurological: Negative for dizziness, weakness and headaches.  Psychiatric/Behavioral: Negative for depression. The patient is not nervous/anxious and does not have insomnia.     Physical Exam  Constitutional: He is oriented to person, place, and time. He appears well-developed and well-nourished. No distress.  obese  Neck: Neck supple. No JVD present. No thyromegaly present.  Cardiovascular: Normal rate, regular rhythm and intact distal pulses.   Respiratory: Effort normal and breath sounds normal. No respiratory distress. Has wheezes.  GI: Soft. Bowel sounds are normal. He exhibits no distension. There is no tenderness.  Musculoskeletal: Normal range of motion. Has 2+ edema present .   Neurological: He is alert and oriented to person, place, and time.  Skin: Skin is warm. He is not diaphoretic.  Psychiatric: He has a normal mood and affect.       ASSESSMENT/ PLAN:  1. Chf: due to his continued weight gain will add zaroxolyn 5 mg daily 30 minutes before his am lasix will check bmp on 11-08-13 and will continue to monitor his status.      Synthia Innocent NP [facility]  Contact [phone] Monday through Friday 8am- 5pm  After  hours call [phone]

## 2013-10-31 ENCOUNTER — Non-Acute Institutional Stay (SKILLED_NURSING_FACILITY): Payer: Medicare HMO | Admitting: Adult Health

## 2013-10-31 DIAGNOSIS — D638 Anemia in other chronic diseases classified elsewhere: Secondary | ICD-10-CM

## 2013-10-31 DIAGNOSIS — K5900 Constipation, unspecified: Secondary | ICD-10-CM

## 2013-10-31 DIAGNOSIS — E1149 Type 2 diabetes mellitus with other diabetic neurological complication: Secondary | ICD-10-CM

## 2013-10-31 DIAGNOSIS — E1142 Type 2 diabetes mellitus with diabetic polyneuropathy: Secondary | ICD-10-CM

## 2013-10-31 DIAGNOSIS — I509 Heart failure, unspecified: Secondary | ICD-10-CM

## 2013-10-31 DIAGNOSIS — F411 Generalized anxiety disorder: Secondary | ICD-10-CM

## 2013-10-31 DIAGNOSIS — J449 Chronic obstructive pulmonary disease, unspecified: Secondary | ICD-10-CM

## 2013-10-31 DIAGNOSIS — N400 Enlarged prostate without lower urinary tract symptoms: Secondary | ICD-10-CM

## 2013-10-31 DIAGNOSIS — I739 Peripheral vascular disease, unspecified: Secondary | ICD-10-CM

## 2013-10-31 DIAGNOSIS — I150 Renovascular hypertension: Secondary | ICD-10-CM

## 2013-10-31 DIAGNOSIS — G250 Essential tremor: Secondary | ICD-10-CM

## 2013-10-31 DIAGNOSIS — M109 Gout, unspecified: Secondary | ICD-10-CM

## 2013-10-31 DIAGNOSIS — F32A Depression, unspecified: Secondary | ICD-10-CM

## 2013-10-31 DIAGNOSIS — F329 Major depressive disorder, single episode, unspecified: Secondary | ICD-10-CM

## 2013-10-31 DIAGNOSIS — F3289 Other specified depressive episodes: Secondary | ICD-10-CM

## 2013-10-31 DIAGNOSIS — G252 Other specified forms of tremor: Secondary | ICD-10-CM

## 2013-10-31 DIAGNOSIS — K219 Gastro-esophageal reflux disease without esophagitis: Secondary | ICD-10-CM

## 2013-11-02 ENCOUNTER — Non-Acute Institutional Stay (SKILLED_NURSING_FACILITY): Payer: Medicare HMO | Admitting: Adult Health

## 2013-11-02 DIAGNOSIS — I509 Heart failure, unspecified: Secondary | ICD-10-CM

## 2013-11-06 ENCOUNTER — Encounter: Payer: Self-pay | Admitting: Adult Health

## 2013-11-06 NOTE — Progress Notes (Signed)
Patient ID: Bill [name], male   DOB: 1946-06-04, 68 y.o.   MRN: [id number]     [facility]  Allergies  Allergen Reactions  . Amoxil [Amoxicillin]     Per MAR  . Penicillins Other (See Comments)    unknown     Chief Complaint  Patient presents with  . Medical Managment of Chronic Issues    HPI:  He is being seen for the management of his chronic illnesses. He continues to gain weight his current weight is 289 pounds and his weight on 10-30-13 is 284 pounds. He is on lasix 80 mg twice daily and zaroxolyn 5 mg daily. He is having shortness of breath. The nursing staff is concerned about his heart function.    Past Medical History  Diagnosis Date  . COPD (chronic obstructive pulmonary disease)   . Diabetes mellitus without complication   . Hyperlipidemia   . Depression   . CHF (congestive heart failure)   . Gout   . OSA (obstructive sleep apnea)   . Pneumonia 07/26/2013  . CHF with unknown LVEF 08/11/2013  . Chronic respiratory failure 06/19/2013  . GERD (gastroesophageal reflux disease) 06/19/2013  . DM type 2 with diabetic peripheral neuropathy 06/19/2013  . BPH (benign prostatic hyperplasia) 06/19/2013  . AKI (acute kidney injury) 07/27/2013  . Urinary retention 08/11/2013  . Sepsis 07/27/2013  . Physical deconditioning 06/19/2013  . Perineal abscess 08/01/2013  . Depression, controlled 06/19/2013  . Anemia of chronic disease 07/31/2013    No past surgical history on file.  VITAL SIGNS BP 130/55  Pulse 80  Ht 5\' 11"  (1.803 m)  Wt 289 lb (131.09 kg)  BMI 40.33 kg/m2   Patient's Medications  New Prescriptions   No medications on file  Previous Medications   ACETAMINOPHEN (TYLENOL) 325 MG TABLET    Take 650 mg by mouth every 4 (four) hours as needed for mild pain.   ALBUTEROL (PROVENTIL HFA;VENTOLIN HFA) 108 (90 BASE) MCG/ACT INHALER    Inhale 2 puffs into the lungs every 6 (six) hours as needed for wheezing or shortness of breath.    ALBUTEROL (PROVENTIL)  (2.5 MG/3ML) 0.083% NEBULIZER SOLUTION    Take 2.5 mg by nebulization 2 (two) times daily as needed for wheezing or shortness of breath.    ALLOPURINOL (ZYLOPRIM) 300 MG TABLET    Take 300 mg by mouth 2 (two) times daily.   AMLODIPINE (NORVASC) 10 MG TABLET    Take 10 mg by mouth daily.   BUDESONIDE-FORMOTEROL (SYMBICORT) 160-4.5 MCG/ACT INHALER    Inhale 2 puffs into the lungs 2 (two) times daily.   CITALOPRAM (CELEXA) 40 MG TABLET    Take 40 mg by mouth daily.   CLONAZEPAM (KLONOPIN) 2 MG TABLET    Take 1 mg by mouth 2 (two) times daily as needed. Take three tablets by mouth every evening as needed for severe anxiety   CRANBERRY 250 MG CAPS    Take 1 capsule by mouth 2 (two) times daily.   FERROUS SULFATE 325 (65 FE) MG TABLET    Take 325 mg by mouth 3 (three) times daily with meals.   FLUTICASONE (FLONASE) 50 MCG/ACT NASAL SPRAY    Place 2 sprays into both nostrils daily.   FUROSEMIDE (LASIX) 80 MG TABLET    Take 1 tablet (80 mg total) by mouth 2 (two) times daily.   GABAPENTIN (NEURONTIN) 100 MG CAPSULE    Take 100 mg by mouth 2 (two) times daily.   HYDROCODONE-ACETAMINOPHEN (  NORCO/VICODIN) 5-325 MG PER TABLET    Take 1 tablet by mouth every 4 (four) hours as needed for moderate pain.   INSULIN DETEMIR (LEVEMIR) 100 UNIT/ML INJECTION    Inject 0.4 mLs (40 Units total) into the skin daily.   INSULIN LISPRO (HUMALOG) 100 UNIT/ML INJECTION    Inject 2-10 Units into the skin 4 (four) times daily -  before meals and at bedtime. Sliding scale   LISINOPRIL (PRINIVIL,ZESTRIL) 10 MG TABLET    Take 1 tablet (10 mg total) by mouth daily.   METOLAZONE (ZAROXOLYN) 5 MG TABLET    Take 1 tablet (5 mg total) by mouth daily.   OLANZAPINE (ZYPREXA) 10 MG TABLET    Take 5 mg by mouth daily.    PANTOPRAZOLE (PROTONIX) 40 MG TABLET    Take 40 mg by mouth 2 (two) times daily.   POLYETHYLENE GLYCOL (MIRALAX / GLYCOLAX) PACKET    Take 17 g by mouth daily.   POTASSIUM CHLORIDE SA (K-DUR,KLOR-CON) 20 MEQ TABLET    Take  20 mEq by mouth daily.   PREDNISONE (DELTASONE) 10 MG TABLET    Take 10 mg by mouth daily with breakfast.   PRIMIDONE (MYSOLINE) 250 MG TABLET    Take 250 mg by mouth 2 (two) times daily.   SENNOSIDES-DOCUSATE SODIUM (SENOKOT-S) 8.6-50 MG TABLET    Take 1 tablet by mouth 2 (two) times daily.   SITAGLIPTIN-METFORMIN (JANUMET) 50-500 MG PER TABLET    Take 1 tablet by mouth 2 (two) times daily with a meal.   TAMSULOSIN (FLOMAX) 0.4 MG CAPS CAPSULE    Take 0.4 mg by mouth daily.   TIOTROPIUM (SPIRIVA) 18 MCG INHALATION CAPSULE    Place 18 mcg into inhaler and inhale daily.   TRAMADOL (ULTRAM) 50 MG TABLET    Take 1 tablet by mouth every 12 hours as needed for pain.   ZOLPIDEM (AMBIEN) 10 MG TABLET    Take 10 mg by mouth at bedtime as needed for sleep.  Modified Medications   No medications on file  Discontinued Medications   No medications on file    SIGNIFICANT DIAGNOSTIC EXAMS  07-26-13: chest x-ray: minimal bibasilar pneumonitis with superimposed mild congestive changes.   08-04-13: kub: bowel gas pattern could be secondary to partial bowel obstruction versus moderate to severe ileus. No abnormal calcifications in the region of the abdomen   08-10-13: ct of pelvis: 4.1 x 2.4 cm left perineal/scrotal abscess. Associated foci of soft tissue gas, correlate for recent intervention.  Additional 4.3 x 2.3 cm left gluteal fluid collection, suspicious  for early/developing abscess.  Associated mild subcutaneous stranding in the left gluteal region,  likely reflecting cellulitis.  Mildly thick-walled bladder, correlate for cystitis.   LAS REVIEWED:   07-10-13: glucose 207; bun 18; creat 1.0; k+ 3.8; na++ 137  07-24-13: glucose 296; bun 24; creat 1.4; k+3.6; na++ 134 07-26-13: wbc 15.5; hgb 9.8; hct 34.1; mcv 85; plt 254; glucose 229; bun 25; creat 1.2; k+3.7;na++ 133 07-31-13: wbc 13.0; hgb 9.2; hct 32.8; mcv 85; plt 267; glucose 136;bun 26; creat 1.0; k+3.9; na++ 137; liver normal albumin 2.7  hgb a1c 7.7  08-02-13: glucose 97; bun 30;creat 1.2; k+4.0; na++ 140;  08-03-13: wbc 26.2; hgb 10.4; hct 35.9; mcv 85.1 plt 289; glucose 158; bun 29;creat 1.2;k+3.9; na++138  08-09-13: wbc 18.3; hgb 10.3; hct 37.6; mcv 86.2 plt 318  08-21-13: wbc 8.8; hgb 10.8; hct 38.3; mcv 85.1;plt 316; glucose 106; bun 13; creat 0.9; k+3.9; na++138 10-23-13: BNP:  17 10-27-13: glucose 140; bun 31; creat 1.1; k+4.2; na++140        .Review of Systems  Constitutional: Negative for malaise/fatigue.  HENT: negative for congestion      Respiratory: Positive for cough. HAS  shortness of breath and wheezing Cardiovascular: Negative for chest pain, palpitations HAS EDEMA   Gastrointestinal: Negative for heartburn, abdominal pain and constipation.  Musculoskeletal: negative for pint pain . Negative for myalgias.  Skin: negative   Neurological: Negative for dizziness, weakness and headaches.  Psychiatric/Behavioral: Negative for depression. The patient is not nervous/anxious and does not have insomnia.     Physical Exam  Constitutional: He is oriented to person, place, and time. He appears well-developed and well-nourished. No distress.  obese  Neck: Neck supple. No JVD present. No thyromegaly present.  Cardiovascular: Normal rate, regular rhythm and intact distal pulses.   Respiratory: Effort normal and breath sounds normal. No respiratory distress. Has wheezes.  GI: Soft. Bowel sounds are normal. He exhibits no distension. There is no tenderness.  Musculoskeletal: Normal range of motion. Has 2+ edema present .   Neurological: He is alert and oriented to person, place, and time.  Skin: Skin is warm. He is not diaphoretic.  Psychiatric: He has a normal mood and affect.       ASSESSMENT/ PLAN:  1. Hypertension: stable will continue norvasc 10 mg daily; lisinopril 10 mg daily   2. COPD: is without change in status; will continue use of 02; chronic use of prednisone 10 mg daily; symbicort 160/4.5 2 puffs  twice daily; spiriva daily  albuterol neb four times daily prn; flonase daily will monitor   3. CHF: his status is worse; will continue 1800 cc fluid restriction; lasix 80 mg twice daily with k+ 20 meq daily; will give an extra lasix 80 mg today; will continue zaroxolyn 5 mg daily; will continue daily weights.   4. Diabetes: is stable will continue levemir 40 units daily; janumet 50/500 mg twice daily and novolog 5 units prior to meals for cbg >=150 will monitor   5. Gout: is stable no recent flares; will continue allopurinol 300 mg twice daily; and colchicine 0.6 mg daily as needed and will monitor   6. Essential tremor: is presently stable will continue primidone 250 mg twice daily and will monitor   7. PVD: no complaints of pain is taking neurontin 100 mg twice daily has ultram 50 mg twice daily as needed  will monitor   8. Anemia: is stable will continue iron 3 times daily  9. Gerd: will continue protonix 40 mg twice daily   10. Constipation: will continue miralax daily; and senna s twice daily   11. BPH: will continue flomax 0.4 mg daily   12. Depression and anxiety: he is emotionally stable is followed by [facility]. Will continue celexa 40 mg daily; zyprexa 5 mg daily; and has klonopin 1 mg twice daily as needed will monitor       Synthia Innocenteborah Dalvin Clipper NP [facility]  Contact [phone] Monday through Friday 8am- 5pm  After hours call [phone]

## 2013-11-09 NOTE — Progress Notes (Signed)
Patient ID: Bill [name], male   DOB: 1946-07-10, 68 y.o.   MRN: [id number]     [facility]  Allergies  Allergen Reactions  . Amoxil [Amoxicillin]     Per MAR  . Penicillins Other (See Comments)    unknown     Chief Complaint  Patient presents with  . Acute Visit    weight gain    HPI:  He has gained weight from 284 pounds on 11-01-13 to 292 pounds today. On 10-31-13 he received an extra 80 mg lasix. He continues to have shortness of breath; and edema present. He is not having chest pain present.  He is having difficulty following his 1800 cc fluid restriction. We discussed at great length. He verbalize understanding and did state that he would follow the fluid restriction closely. He also stated that he would do better with following his salt restriction as well.   Past Medical History  Diagnosis Date  . COPD (chronic obstructive pulmonary disease)   . Diabetes mellitus without complication   . Hyperlipidemia   . Depression   . CHF (congestive heart failure)   . Gout   . OSA (obstructive sleep apnea)   . Pneumonia 07/26/2013  . CHF with unknown LVEF 08/11/2013  . Chronic respiratory failure 06/19/2013  . GERD (gastroesophageal reflux disease) 06/19/2013  . DM type 2 with diabetic peripheral neuropathy 06/19/2013  . BPH (benign prostatic hyperplasia) 06/19/2013  . AKI (acute kidney injury) 07/27/2013  . Urinary retention 08/11/2013  . Sepsis 07/27/2013  . Physical deconditioning 06/19/2013  . Perineal abscess 08/01/2013  . Depression, controlled 06/19/2013  . Anemia of chronic disease 07/31/2013    No past surgical history on file.  VITAL SIGNS BP 138/79  Pulse 69  Ht 5\' 11"  (1.803 m)  Wt 292 lb (132.45 kg)  BMI 40.74 kg/m2   Patient's Medications  New Prescriptions   No medications on file  Previous Medications   ACETAMINOPHEN (TYLENOL) 325 MG TABLET    Take 650 mg by mouth every 4 (four) hours as needed for mild pain.   ALBUTEROL (PROVENTIL HFA;VENTOLIN  HFA) 108 (90 BASE) MCG/ACT INHALER    Inhale 2 puffs into the lungs every 6 (six) hours as needed for wheezing or shortness of breath.    ALBUTEROL (PROVENTIL) (2.5 MG/3ML) 0.083% NEBULIZER SOLUTION    Take 2.5 mg by nebulization 2 (two) times daily as needed for wheezing or shortness of breath.    ALLOPURINOL (ZYLOPRIM) 300 MG TABLET    Take 300 mg by mouth 2 (two) times daily.   AMLODIPINE (NORVASC) 10 MG TABLET    Take 10 mg by mouth daily.   BUDESONIDE-FORMOTEROL (SYMBICORT) 160-4.5 MCG/ACT INHALER    Inhale 2 puffs into the lungs 2 (two) times daily.   CITALOPRAM (CELEXA) 40 MG TABLET    Take 40 mg by mouth daily.   CLONAZEPAM (KLONOPIN) 2 MG TABLET    Take 1 mg by mouth 2 (two) times daily as needed. Take three tablets by mouth every evening as needed for severe anxiety   CRANBERRY 250 MG CAPS    Take 1 capsule by mouth 2 (two) times daily.   FERROUS SULFATE 325 (65 FE) MG TABLET    Take 325 mg by mouth 3 (three) times daily with meals.   FLUTICASONE (FLONASE) 50 MCG/ACT NASAL SPRAY    Place 2 sprays into both nostrils daily.   FUROSEMIDE (LASIX) 80 MG TABLET    Take 1 tablet (80 mg total) by  mouth 2 (two) times daily.   GABAPENTIN (NEURONTIN) 100 MG CAPSULE    Take 100 mg by mouth 2 (two) times daily.   HYDROCODONE-ACETAMINOPHEN (NORCO/VICODIN) 5-325 MG PER TABLET    Take 1 tablet by mouth every 4 (four) hours as needed for moderate pain.   INSULIN DETEMIR (LEVEMIR) 100 UNIT/ML INJECTION    Inject 0.4 mLs (40 Units total) into the skin daily.   INSULIN LISPRO (HUMALOG) 100 UNIT/ML INJECTION    Inject 2-10 Units into the skin 4 (four) times daily -  before meals and at bedtime. Sliding scale   LISINOPRIL (PRINIVIL,ZESTRIL) 10 MG TABLET    Take 1 tablet (10 mg total) by mouth daily.   METOLAZONE (ZAROXOLYN) 5 MG TABLET    Take 1 tablet (5 mg total) by mouth daily.   OLANZAPINE (ZYPREXA) 10 MG TABLET    Take 5 mg by mouth daily.    PANTOPRAZOLE (PROTONIX) 40 MG TABLET    Take 40 mg by mouth 2  (two) times daily.   POLYETHYLENE GLYCOL (MIRALAX / GLYCOLAX) PACKET    Take 17 g by mouth daily.   POTASSIUM CHLORIDE SA (K-DUR,KLOR-CON) 20 MEQ TABLET    Take 20 mEq by mouth daily.   PREDNISONE (DELTASONE) 10 MG TABLET    Take 10 mg by mouth daily with breakfast.   PRIMIDONE (MYSOLINE) 250 MG TABLET    Take 250 mg by mouth 2 (two) times daily.   SENNOSIDES-DOCUSATE SODIUM (SENOKOT-S) 8.6-50 MG TABLET    Take 1 tablet by mouth 2 (two) times daily.   SITAGLIPTIN-METFORMIN (JANUMET) 50-500 MG PER TABLET    Take 1 tablet by mouth 2 (two) times daily with a meal.   TAMSULOSIN (FLOMAX) 0.4 MG CAPS CAPSULE    Take 0.4 mg by mouth daily.   TIOTROPIUM (SPIRIVA) 18 MCG INHALATION CAPSULE    Place 18 mcg into inhaler and inhale daily.   TRAMADOL (ULTRAM) 50 MG TABLET    Take 1 tablet by mouth every 12 hours as needed for pain.   ZOLPIDEM (AMBIEN) 10 MG TABLET    Take 10 mg by mouth at bedtime as needed for sleep.  Modified Medications   No medications on file  Discontinued Medications   No medications on file    SIGNIFICANT DIAGNOSTIC EXAMS  07-26-13: chest x-ray: minimal bibasilar pneumonitis with superimposed mild congestive changes.   08-04-13: kub: bowel gas pattern could be secondary to partial bowel obstruction versus moderate to severe ileus. No abnormal calcifications in the region of the abdomen   08-10-13: ct of pelvis: 4.1 x 2.4 cm left perineal/scrotal abscess. Associated foci of soft tissue gas, correlate for recent intervention.  Additional 4.3 x 2.3 cm left gluteal fluid collection, suspicious  for early/developing abscess.  Associated mild subcutaneous stranding in the left gluteal region,  likely reflecting cellulitis.  Mildly thick-walled bladder, correlate for cystitis.   LAS REVIEWED:   07-10-13: glucose 207; bun 18; creat 1.0; k+ 3.8; na++ 137  07-24-13: glucose 296; bun 24; creat 1.4; k+3.6; na++ 134 07-26-13: wbc 15.5; hgb 9.8; hct 34.1; mcv 85; plt 254; glucose 229;  bun 25; creat 1.2; k+3.7;na++ 133 07-31-13: wbc 13.0; hgb 9.2; hct 32.8; mcv 85; plt 267; glucose 136;bun 26; creat 1.0; k+3.9; na++ 137; liver normal albumin 2.7 hgb a1c 7.7  08-02-13: glucose 97; bun 30;creat 1.2; k+4.0; na++ 140;  08-03-13: wbc 26.2; hgb 10.4; hct 35.9; mcv 85.1 plt 289; glucose 158; bun 29;creat 1.2;k+3.9; na++138  08-09-13: wbc 18.3; hgb 10.3;  hct 37.6; mcv 86.2 plt 318  08-21-13: wbc 8.8; hgb 10.8; hct 38.3; mcv 85.1;plt 316; glucose 106; bun 13; creat 0.9; k+3.9; na++138 10-23-13: BNP: 17 10-27-13: glucose 140; bun 31; creat 1.1; k+4.2; na++140        .Review of Systems  Constitutional: Negative for malaise/fatigue.  HENT: negative for congestion      Respiratory: negative for cough . HAS  shortness of breath and wheezing Cardiovascular: Negative for chest pain, palpitations HAS EDEMA   Gastrointestinal: Negative for heartburn, abdominal pain and constipation.  Musculoskeletal: negative for pint pain . Negative for myalgias.  Skin: negative   Neurological: Negative for dizziness, weakness and headaches.  Psychiatric/Behavioral: Negative for depression. The patient is not nervous/anxious and does not have insomnia.     Physical Exam  Constitutional: He is oriented to person, place, and time. He appears well-developed and well-nourished. No distress.  obese  Neck: Neck supple. No JVD present. No thyromegaly present.  Cardiovascular: Normal rate, regular rhythm and intact distal pulses.   Respiratory: Effort normal and breath sounds normal. No respiratory distress. Has wheezes.  GI: Soft. Bowel sounds are normal. He exhibits no distension. There is no tenderness.  Musculoskeletal: Normal range of motion. Has 2+ edema present .   Neurological: He is alert and oriented to person, place, and time.  Skin: Skin is warm. He is not diaphoretic.  Psychiatric: He has a normal mood and affect.       ASSESSMENT/ PLAN:  1. chf will give him lasix 80 mg one time now; in  the am will check bmp and bnp. I have reinforced his education regarding his fluid restriction and low salt intake. Will continue to monitor his status.         Synthia Innocenteborah Green NP [facility]  Contact [phone] Monday through Friday 8am- 5pm  After hours call [phone]

## 2013-11-10 ENCOUNTER — Non-Acute Institutional Stay (SKILLED_NURSING_FACILITY): Payer: Medicare HMO | Admitting: Adult Health

## 2013-11-10 DIAGNOSIS — N189 Chronic kidney disease, unspecified: Secondary | ICD-10-CM

## 2013-11-10 DIAGNOSIS — J449 Chronic obstructive pulmonary disease, unspecified: Secondary | ICD-10-CM

## 2013-11-10 DIAGNOSIS — I509 Heart failure, unspecified: Secondary | ICD-10-CM

## 2013-11-13 ENCOUNTER — Encounter: Payer: Self-pay | Admitting: Adult Health

## 2013-11-13 DIAGNOSIS — N189 Chronic kidney disease, unspecified: Secondary | ICD-10-CM | POA: Insufficient documentation

## 2013-11-13 LAB — HEPATIC FUNCTION PANEL
ALK PHOS: 105 U/L (ref 25–125)
ALT: 13 U/L (ref 10–40)
AST: 10 U/L — AB (ref 14–40)

## 2013-11-13 MED ORDER — TORSEMIDE 20 MG PO TABS: 40.0000 mg | ORAL_TABLET | Freq: Two times a day (BID) | ORAL | Status: DC

## 2013-11-13 MED ORDER — SPIRONOLACTONE 25 MG PO TABS: 12.5000 mg | ORAL_TABLET | Freq: Two times a day (BID) | ORAL | Status: DC

## 2013-11-13 NOTE — Progress Notes (Signed)
Patient ID: Bill [name], male   DOB: Jan 25, 1946, 68 y.o.   MRN: [id number]   [facility]  Allergies  Allergen Reactions  . Amoxil [Amoxicillin]     Per MAR  . Penicillins Other (See Comments)    unknown     Chief Complaint  Patient presents with  . Acute Visit    follow up CHF    HPI:  He continues to gain weight. His current weight is 297 pounds 12 oz. He is taking a large dose of lasix 160 mg total daily with zaroxolyn daily. He is on norvasc 10 mg daily which can contribute to peripheral edema. His last albumin was 2.7; which could also be playing a role in his edema. I have spoken with Dr. Glade Lloyd regarding his condition. She has suggested to change his lasix to demadex. I will also add aldactone to his regimen as well.  He is doing his best at this time to adhere to the 1800 cc fluid restriction.  He understands the importance of this restriction.   Past Medical History  Diagnosis Date  . COPD (chronic obstructive pulmonary disease)   . Diabetes mellitus without complication   . Hyperlipidemia   . Depression   . CHF (congestive heart failure)   . Gout   . OSA (obstructive sleep apnea)   . Pneumonia 07/26/2013  . CHF with unknown LVEF 08/11/2013  . Chronic respiratory failure 06/19/2013  . GERD (gastroesophageal reflux disease) 06/19/2013  . DM type 2 with diabetic peripheral neuropathy 06/19/2013  . BPH (benign prostatic hyperplasia) 06/19/2013  . AKI (acute kidney injury) 07/27/2013  . Urinary retention 08/11/2013  . Sepsis 07/27/2013  . Physical deconditioning 06/19/2013  . Perineal abscess 08/01/2013  . Depression, controlled 06/19/2013  . Anemia of chronic disease 07/31/2013    No past surgical history on file.  VITAL SIGNS BP 122/55  Pulse 67  Ht 5\' 11"  (1.803 m)  Wt 297 lb 12.8 oz (135.081 kg)  BMI 41.55 kg/m2   Patient's Medications  New Prescriptions   No medications on file  Previous Medications   ACETAMINOPHEN (TYLENOL) 325 MG TABLET    Take  650 mg by mouth every 4 (four) hours as needed for mild pain.   ALBUTEROL (PROVENTIL HFA;VENTOLIN HFA) 108 (90 BASE) MCG/ACT INHALER    Inhale 2 puffs into the lungs every 6 (six) hours as needed for wheezing or shortness of breath.    ALBUTEROL (PROVENTIL) (2.5 MG/3ML) 0.083% NEBULIZER SOLUTION    Take 2.5 mg by nebulization 2 (two) times daily as needed for wheezing or shortness of breath.    ALLOPURINOL (ZYLOPRIM) 300 MG TABLET    Take 300 mg by mouth 2 (two) times daily.   AMLODIPINE (NORVASC) 10 MG TABLET    Take 10 mg by mouth daily.   BUDESONIDE-FORMOTEROL (SYMBICORT) 160-4.5 MCG/ACT INHALER    Inhale 2 puffs into the lungs 2 (two) times daily.   CITALOPRAM (CELEXA) 40 MG TABLET    Take 40 mg by mouth daily.   CLONAZEPAM (KLONOPIN) 2 MG TABLET    Take 1 mg by mouth 2 (two) times daily as needed. Take three tablets by mouth every evening as needed for severe anxiety   CRANBERRY 250 MG CAPS    Take 1 capsule by mouth 2 (two) times daily.   FERROUS SULFATE 325 (65 FE) MG TABLET    Take 325 mg by mouth 3 (three) times daily with meals.   FLUTICASONE (FLONASE) 50 MCG/ACT NASAL SPRAY  Place 2 sprays into both nostrils daily.   FUROSEMIDE (LASIX) 80 MG TABLET    Take 1 tablet (80 mg total) by mouth 2 (two) times daily.   GABAPENTIN (NEURONTIN) 100 MG CAPSULE    Take 100 mg by mouth 2 (two) times daily.   HYDROCODONE-ACETAMINOPHEN (NORCO/VICODIN) 5-325 MG PER TABLET    Take 1 tablet by mouth every 4 (four) hours as needed for moderate pain.   INSULIN DETEMIR (LEVEMIR) 100 UNIT/ML INJECTION    Inject 0.4 mLs (40 Units total) into the skin daily.   INSULIN LISPRO (HUMALOG) 100 UNIT/ML INJECTION    Inject 2-10 Units into the skin 4 (four) times daily -  before meals and at bedtime. Sliding scale   LISINOPRIL (PRINIVIL,ZESTRIL) 10 MG TABLET    Take 1 tablet (10 mg total) by mouth daily.   METOLAZONE (ZAROXOLYN) 5 MG TABLET    Take 1 tablet (5 mg total) by mouth daily.   OLANZAPINE (ZYPREXA) 10 MG  TABLET    Take 5 mg by mouth daily.    PANTOPRAZOLE (PROTONIX) 40 MG TABLET    Take 40 mg by mouth 2 (two) times daily.   POLYETHYLENE GLYCOL (MIRALAX / GLYCOLAX) PACKET    Take 17 g by mouth daily.   POTASSIUM CHLORIDE SA (K-DUR,KLOR-CON) 20 MEQ TABLET    Take 20 mEq by mouth daily.   PREDNISONE (DELTASONE) 10 MG TABLET    Take 10 mg by mouth daily with breakfast.   PRIMIDONE (MYSOLINE) 250 MG TABLET    Take 250 mg by mouth 2 (two) times daily.   SENNOSIDES-DOCUSATE SODIUM (SENOKOT-S) 8.6-50 MG TABLET    Take 1 tablet by mouth 2 (two) times daily.   SITAGLIPTIN-METFORMIN (JANUMET) 50-500 MG PER TABLET    Take 1 tablet by mouth 2 (two) times daily with a meal.   TAMSULOSIN (FLOMAX) 0.4 MG CAPS CAPSULE    Take 0.4 mg by mouth daily.   TIOTROPIUM (SPIRIVA) 18 MCG INHALATION CAPSULE    Place 18 mcg into inhaler and inhale daily.   TRAMADOL (ULTRAM) 50 MG TABLET    Take 1 tablet by mouth every 12 hours as needed for pain.   ZOLPIDEM (AMBIEN) 10 MG TABLET    Take 10 mg by mouth at bedtime as needed for sleep.  Modified Medications   No medications on file  Discontinued Medications   No medications on file    SIGNIFICANT DIAGNOSTIC EXAMS  07-26-13: chest x-ray: minimal bibasilar pneumonitis with superimposed mild congestive changes.   08-04-13: kub: bowel gas pattern could be secondary to partial bowel obstruction versus moderate to severe ileus. No abnormal calcifications in the region of the abdomen   08-10-13: ct of pelvis: 4.1 x 2.4 cm left perineal/scrotal abscess. Associated foci of soft tissue gas, correlate for recent intervention.  Additional 4.3 x 2.3 cm left gluteal fluid collection, suspicious  for early/developing abscess.  Associated mild subcutaneous stranding in the left gluteal region,  likely reflecting cellulitis.  Mildly thick-walled bladder, correlate for cystitis.  11-09-13: chest x-ray: consistent with chronic chf; findings appear similar to previous studies. There is a  question of slight progression of chf.    LAS REVIEWED:   07-10-13: glucose 207; bun 18; creat 1.0; k+ 3.8; na++ 137  07-24-13: glucose 296; bun 24; creat 1.4; k+3.6; na++ 134 07-26-13: wbc 15.5; hgb 9.8; hct 34.1; mcv 85; plt 254; glucose 229; bun 25; creat 1.2; k+3.7;na++ 133 07-31-13: wbc 13.0; hgb 9.2; hct 32.8; mcv 85; plt 267; glucose  136;bun 26; creat 1.0; k+3.9; na++ 137; liver normal albumin 2.7 hgb a1c 7.7  08-02-13: glucose 97; bun 30;creat 1.2; k+4.0; na++ 140;  08-03-13: wbc 26.2; hgb 10.4; hct 35.9; mcv 85.1 plt 289; glucose 158; bun 29;creat 1.2;k+3.9; na++138  08-09-13: wbc 18.3; hgb 10.3; hct 37.6; mcv 86.2 plt 318  08-21-13: wbc 8.8; hgb 10.8; hct 38.3; mcv 85.1;plt 316; glucose 106; bun 13; creat 0.9; k+3.9; na++138 10-23-13: BNP: 17 10-27-13: glucose 140; bun 31; creat 1.1; k+4.2; na++140  11-06-13: glucose 98; bun 83; creat 1.9; k+4.7; na++133 11-10-13: glucose 141; bun 79; creat 1.8; k+4.8; na++135        .Review of Systems  Constitutional: Negative for malaise/fatigue.  HENT: negative for congestion      Respiratory: negative for cough . HAS  shortness of breath and wheezing Cardiovascular: Negative for chest pain, palpitations HAS EDEMA   Gastrointestinal: Negative for heartburn, abdominal pain and constipation.  Musculoskeletal: negative for pint pain . Negative for myalgias.  Skin: negative   Neurological: Negative for dizziness, weakness and headaches.  Psychiatric/Behavioral: Negative for depression. The patient is not nervous/anxious and does not have insomnia.     Physical Exam  Constitutional: He is oriented to person, place, and time. He appears well-developed and well-nourished. No distress.  obese  Neck: Neck supple. No JVD present. No thyromegaly present.  Cardiovascular: Normal rate, regular rhythm and intact distal pulses.   Respiratory: Effort normal and breath sounds normal. No respiratory distress. Has wheezes.  GI: Soft. Bowel sounds are normal.  He exhibits no distension. There is no tenderness.  Musculoskeletal: Normal range of motion. Has 2+ -3+ edema present .   Neurological: He is alert and oriented to person, place, and time.  Skin: Skin is warm. He is not diaphoretic.  Psychiatric: He has a normal mood and affect.      ASSESSMENT/ PLAN:  1. CHF/ CKD: will stop the lasix;  Zaroxolyn; and norvasc. Will begin him on demadex 40 mg twice daily; will begin aldactone 12.5 mg twice daily. Will setup a 2-d echo to look further at his heart function; on Monday will check liver function and pre-albumin.  Will continue to monitor his status.    Time spent with patient 50 minutes.        Synthia Innocent NP [facility]  Contact [phone] Monday through Friday 8am- 5pm  After hours call [phone]

## 2013-11-14 ENCOUNTER — Ambulatory Visit (HOSPITAL_COMMUNITY): Payer: Medicare Other

## 2013-11-14 ENCOUNTER — Non-Acute Institutional Stay (SKILLED_NURSING_FACILITY): Payer: Medicare HMO | Admitting: Adult Health

## 2013-11-14 ENCOUNTER — Encounter: Payer: Self-pay | Admitting: Adult Health

## 2013-11-14 DIAGNOSIS — N189 Chronic kidney disease, unspecified: Secondary | ICD-10-CM

## 2013-11-14 DIAGNOSIS — I509 Heart failure, unspecified: Secondary | ICD-10-CM

## 2013-11-14 NOTE — Progress Notes (Signed)
Patient ID: Bill [name], Bill [name]   DOB: Oct 23, 1945, 68 y.o.   MRN: [id number]    [facility]  Allergies  Allergen Reactions  . Penicillins Other (See Comments)    unknown     Chief Complaint  Patient presents with  . Acute Visit    weight gain    HPI:  He has continued to gain weight. His weight today is at 300 pounds. On 11-10-13 he was started on aldactone 12.5 mg twice daily and demadex 40 mg twice daily. His renal function renal function remains poor. He continues to struggle with complying with the fluid restriction and salt restriction. He tells me that he is trying his best.    Past Medical History  Diagnosis Date  . COPD (chronic obstructive pulmonary disease)   . Diabetes mellitus without complication   . Hyperlipidemia   . Depression   . CHF (congestive heart failure)   . Gout   . OSA (obstructive sleep apnea)   . Pneumonia 07/26/2013  . CHF with unknown LVEF 08/11/2013  . Chronic respiratory failure 06/19/2013  . GERD (gastroesophageal reflux disease) 06/19/2013  . DM type 2 with diabetic peripheral neuropathy 06/19/2013  . BPH (benign prostatic hyperplasia) 06/19/2013  . AKI (acute kidney injury) 07/27/2013  . Urinary retention 08/11/2013  . Sepsis 07/27/2013  . Physical deconditioning 06/19/2013  . Perineal abscess 08/01/2013  . Depression, controlled 06/19/2013  . Anemia of chronic disease 07/31/2013    No past surgical history on file.  VITAL SIGNS BP 118/58  Pulse 88  Ht 5\' 11"  (1.803 m)  Wt 300 lb (136.079 kg)  BMI 41.86 kg/m2   Patient's Medications  New Prescriptions   No medications on file  Previous Medications   ACETAMINOPHEN (TYLENOL) 325 MG TABLET    Take 650 mg by mouth every 4 (four) hours as needed for mild pain.   ALBUTEROL (PROVENTIL HFA;VENTOLIN HFA) 108 (90 BASE) MCG/ACT INHALER    Inhale 2 puffs into the lungs every 6 (six) hours as needed for wheezing or shortness of breath.    ALBUTEROL (PROVENTIL) (2.5 MG/3ML) 0.083%  NEBULIZER SOLUTION    Take 2.5 mg by nebulization 2 (two) times daily as needed for wheezing or shortness of breath.    ALLOPURINOL (ZYLOPRIM) 300 MG TABLET    Take 300 mg by mouth 2 (two) times daily.   BUDESONIDE-FORMOTEROL (SYMBICORT) 160-4.5 MCG/ACT INHALER    Inhale 2 puffs into the lungs 2 (two) times daily.   CITALOPRAM (CELEXA) 40 MG TABLET    Take 40 mg by mouth daily.   CLONAZEPAM (KLONOPIN) 2 MG TABLET    Take 1 mg by mouth 2 (two) times daily as needed. Take three tablets by mouth every evening as needed for severe anxiety   CRANBERRY 250 MG CAPS    Take 1 capsule by mouth 2 (two) times daily.   FERROUS SULFATE 325 (65 FE) MG TABLET    Take 325 mg by mouth 3 (three) times daily with meals.   FLUTICASONE (FLONASE) 50 MCG/ACT NASAL SPRAY    Place 2 sprays into both nostrils daily.   GABAPENTIN (NEURONTIN) 100 MG CAPSULE    Take 100 mg by mouth 2 (two) times daily.   HYDROCODONE-ACETAMINOPHEN (NORCO/VICODIN) 5-325 MG PER TABLET    Take 1 tablet by mouth every 4 (four) hours as needed for moderate pain.   INSULIN DETEMIR (LEVEMIR) 100 UNIT/ML INJECTION    Inject 0.4 mLs (40 Units total) into the skin daily.   INSULIN  LISPRO (HUMALOG) 100 UNIT/ML INJECTION    Inject 2-10 Units into the skin 4 (four) times daily -  before meals and at bedtime. Sliding scale   LISINOPRIL (PRINIVIL,ZESTRIL) 10 MG TABLET    Take 1 tablet (10 mg total) by mouth daily.   OLANZAPINE (ZYPREXA) 10 MG TABLET    Take 5 mg by mouth daily.    PANTOPRAZOLE (PROTONIX) 40 MG TABLET    Take 40 mg by mouth 2 (two) times daily.   POLYETHYLENE GLYCOL (MIRALAX / GLYCOLAX) PACKET    Take 17 g by mouth daily.   POTASSIUM CHLORIDE SA (K-DUR,KLOR-CON) 20 MEQ TABLET    Take 20 mEq by mouth daily.   PREDNISONE (DELTASONE) 10 MG TABLET    Take 10 mg by mouth daily with breakfast.   PRIMIDONE (MYSOLINE) 250 MG TABLET    Take 250 mg by mouth 2 (two) times daily.   SENNOSIDES-DOCUSATE SODIUM (SENOKOT-S) 8.6-50 MG TABLET    Take 1 tablet  by mouth 2 (two) times daily.   SITAGLIPTIN-METFORMIN (JANUMET) 50-500 MG PER TABLET    Take 1 tablet by mouth 2 (two) times daily with a meal.   SPIRONOLACTONE (ALDACTONE) 25 MG TABLET    Take 0.5 tablets (12.5 mg total) by mouth 2 (two) times daily.   TAMSULOSIN (FLOMAX) 0.4 MG CAPS CAPSULE    Take 0.4 mg by mouth daily.   TIOTROPIUM (SPIRIVA) 18 MCG INHALATION CAPSULE    Place 18 mcg into inhaler and inhale daily.   TORSEMIDE (DEMADEX) 20 MG TABLET    Take 2 tablets (40 mg total) by mouth 2 (two) times daily.   TRAMADOL (ULTRAM) 50 MG TABLET    Take 1 tablet by mouth every 12 hours as needed for pain.   ZOLPIDEM (AMBIEN) 10 MG TABLET    Take 10 mg by mouth at bedtime as needed for sleep.  Modified Medications   No medications on file  Discontinued Medications   No medications on file    SIGNIFICANT DIAGNOSTIC EXAMS  07-26-13: chest x-ray: minimal bibasilar pneumonitis with superimposed mild congestive changes.   08-04-13: kub: bowel gas pattern could be secondary to partial bowel obstruction versus moderate to severe ileus. No abnormal calcifications in the region of the abdomen   08-10-13: ct of pelvis: 4.1 x 2.4 cm left perineal/scrotal abscess. Associated foci of soft tissue gas, correlate for recent intervention.  Additional 4.3 x 2.3 cm left gluteal fluid collection, suspicious  for early/developing abscess.  Associated mild subcutaneous stranding in the left gluteal region,  likely reflecting cellulitis.  Mildly thick-walled bladder, correlate for cystitis.  11-09-13: chest x-ray: consistent with chronic chf; findings appear similar to previous studies. There is a question of slight progression of chf.    LAS REVIEWED:   07-10-13: glucose 207; bun 18; creat 1.0; k+ 3.8; na++ 137  07-24-13: glucose 296; bun 24; creat 1.4; k+3.6; na++ 134 07-26-13: wbc 15.5; hgb 9.8; hct 34.1; mcv 85; plt 254; glucose 229; bun 25; creat 1.2; k+3.7;na++ 133 07-31-13: wbc 13.0; hgb 9.2; hct 32.8;  mcv 85; plt 267; glucose 136;bun 26; creat 1.0; k+3.9; na++ 137; liver normal albumin 2.7 hgb a1c 7.7  08-02-13: glucose 97; bun 30;creat 1.2; k+4.0; na++ 140;  08-03-13: wbc 26.2; hgb 10.4; hct 35.9; mcv 85.1 plt 289; glucose 158; bun 29;creat 1.2;k+3.9; na++138  08-09-13: wbc 18.3; hgb 10.3; hct 37.6; mcv 86.2 plt 318  08-21-13: wbc 8.8; hgb 10.8; hct 38.3; mcv 85.1;plt 316; glucose 106; bun 13; creat 0.9; k+3.9;  na++138 10-23-13: BNP: 17 10-27-13: glucose 140; bun 31; creat 1.1; k+4.2; na++140  11-06-13: glucose 98; bun 83; creat 1.9; k+4.7; na++133 11-10-13: glucose 141; bun 79; creat 1.8; k+4.8; na++135; BNP 15 11-13-13: liver normal albumin 4.0; pre-albumin 40.19         .Review of Systems  Constitutional: Negative for malaise/fatigue.  HENT: negative for congestion      Respiratory: negative for cough . HAS  shortness of breath and wheezing Cardiovascular: Negative for chest pain, palpitations HAS EDEMA   Gastrointestinal: Negative for heartburn, abdominal pain and constipation.  Musculoskeletal: negative for pint pain . Negative for myalgias.  Skin: negative   Neurological: Negative for dizziness, weakness and headaches.  Psychiatric/Behavioral: Negative for depression. The patient is not nervous/anxious and does not have insomnia.     Physical Exam  Constitutional: He is oriented to person, place, and time. He appears well-developed and well-nourished. No distress.  obese  Neck: Neck supple. No JVD present. No thyromegaly present.  Cardiovascular: Normal rate, regular rhythm and intact distal pulses.   Respiratory: Effort normal and breath sounds normal. No respiratory distress. Has wheezes.  GI: Soft. Bowel sounds are normal. He exhibits no distension. There is no tenderness.  Musculoskeletal: Normal range of motion. Has 2+ -3+ edema present .   Neurological: He is alert and oriented to person, place, and time.  Skin: Skin is warm. He is not diaphoretic.  Psychiatric: He has a  normal mood and affect.       ASSESSMENT/ PLAN:  1. CHF: will stop the k+ supplement; will increase the aldactone to 25 mg twice daily and will repeat bmp on 11-17-13 and will monitor his status; will continue his daily weights.       Synthia Innocenteborah Ankur Snowdon NP [facility]  Contact [phone] Monday through Friday 8am- 5pm  After hours call [phone]

## 2013-11-15 ENCOUNTER — Other Ambulatory Visit (HOSPITAL_COMMUNITY): Payer: Self-pay | Admitting: Internal Medicine

## 2013-11-15 DIAGNOSIS — R9430 Abnormal result of cardiovascular function study, unspecified: Secondary | ICD-10-CM

## 2013-11-17 LAB — BASIC METABOLIC PANEL
BUN: 78 mg/dL — AB (ref 4–21)
Creatinine: 1.6 mg/dL — AB (ref 0.6–1.3)
Glucose: 84 mg/dL
POTASSIUM: 4.4 mmol/L (ref 3.4–5.3)
SODIUM: 141 mmol/L (ref 137–147)

## 2013-11-21 MED ORDER — SPIRONOLACTONE 25 MG PO TABS: 25.0000 mg | ORAL_TABLET | Freq: Two times a day (BID) | ORAL | Status: DC

## 2013-11-24 ENCOUNTER — Ambulatory Visit (HOSPITAL_COMMUNITY)
Admission: RE | Admit: 2013-11-24 | Discharge: 2013-11-24 | Disposition: A | Discharge status: Home / Self Care | Payer: Medicare HMO | Source: Ambulatory Visit | Attending: Internal Medicine | Admitting: Internal Medicine

## 2013-11-24 DIAGNOSIS — R9430 Abnormal result of cardiovascular function study, unspecified: Secondary | ICD-10-CM

## 2013-11-24 DIAGNOSIS — I359 Nonrheumatic aortic valve disorder, unspecified: Secondary | ICD-10-CM

## 2013-11-24 DIAGNOSIS — R0609 Other forms of dyspnea: Secondary | ICD-10-CM | POA: Insufficient documentation

## 2013-11-24 DIAGNOSIS — R0989 Other specified symptoms and signs involving the circulatory and respiratory systems: Principal | ICD-10-CM | POA: Insufficient documentation

## 2013-11-29 ENCOUNTER — Non-Acute Institutional Stay (SKILLED_NURSING_FACILITY): Payer: Medicare HMO | Admitting: Adult Health

## 2013-11-29 DIAGNOSIS — G252 Other specified forms of tremor: Secondary | ICD-10-CM

## 2013-11-29 DIAGNOSIS — I739 Peripheral vascular disease, unspecified: Secondary | ICD-10-CM

## 2013-11-29 DIAGNOSIS — G250 Essential tremor: Secondary | ICD-10-CM

## 2013-11-29 DIAGNOSIS — F3289 Other specified depressive episodes: Secondary | ICD-10-CM

## 2013-11-29 DIAGNOSIS — D638 Anemia in other chronic diseases classified elsewhere: Secondary | ICD-10-CM

## 2013-11-29 DIAGNOSIS — E1149 Type 2 diabetes mellitus with other diabetic neurological complication: Secondary | ICD-10-CM

## 2013-11-29 DIAGNOSIS — J449 Chronic obstructive pulmonary disease, unspecified: Secondary | ICD-10-CM

## 2013-11-29 DIAGNOSIS — F329 Major depressive disorder, single episode, unspecified: Secondary | ICD-10-CM

## 2013-11-29 DIAGNOSIS — N183 Chronic kidney disease, stage 3 unspecified: Secondary | ICD-10-CM

## 2013-11-29 DIAGNOSIS — K5900 Constipation, unspecified: Secondary | ICD-10-CM

## 2013-11-29 DIAGNOSIS — E1142 Type 2 diabetes mellitus with diabetic polyneuropathy: Secondary | ICD-10-CM

## 2013-11-29 DIAGNOSIS — F32A Depression, unspecified: Secondary | ICD-10-CM

## 2013-11-29 DIAGNOSIS — K219 Gastro-esophageal reflux disease without esophagitis: Secondary | ICD-10-CM

## 2013-11-29 DIAGNOSIS — F411 Generalized anxiety disorder: Secondary | ICD-10-CM

## 2013-11-29 DIAGNOSIS — M109 Gout, unspecified: Secondary | ICD-10-CM

## 2013-11-29 DIAGNOSIS — I509 Heart failure, unspecified: Secondary | ICD-10-CM

## 2013-11-29 DIAGNOSIS — I150 Renovascular hypertension: Secondary | ICD-10-CM

## 2013-12-06 LAB — BASIC METABOLIC PANEL
BUN: 73 mg/dL — AB (ref 4–21)
CREATININE: 2.3 mg/dL — AB (ref 0.6–1.3)
Glucose: 125 mg/dL
Potassium: 3.5 mmol/L (ref 3.4–5.3)
Sodium: 135 mmol/L — AB (ref 137–147)

## 2013-12-08 ENCOUNTER — Non-Acute Institutional Stay (SKILLED_NURSING_FACILITY): Payer: Medicare HMO | Admitting: Adult Health

## 2013-12-08 DIAGNOSIS — E1149 Type 2 diabetes mellitus with other diabetic neurological complication: Secondary | ICD-10-CM

## 2013-12-08 DIAGNOSIS — N183 Chronic kidney disease, stage 3 unspecified: Secondary | ICD-10-CM

## 2013-12-08 DIAGNOSIS — K219 Gastro-esophageal reflux disease without esophagitis: Secondary | ICD-10-CM

## 2013-12-08 DIAGNOSIS — I5032 Chronic diastolic (congestive) heart failure: Secondary | ICD-10-CM

## 2013-12-08 DIAGNOSIS — M109 Gout, unspecified: Secondary | ICD-10-CM

## 2013-12-08 DIAGNOSIS — I509 Heart failure, unspecified: Secondary | ICD-10-CM

## 2013-12-08 DIAGNOSIS — E1142 Type 2 diabetes mellitus with diabetic polyneuropathy: Secondary | ICD-10-CM

## 2013-12-09 ENCOUNTER — Encounter: Payer: Self-pay | Admitting: Adult Health

## 2013-12-09 DIAGNOSIS — N183 Chronic kidney disease, stage 3 unspecified: Secondary | ICD-10-CM | POA: Insufficient documentation

## 2013-12-09 MED ORDER — TORSEMIDE 20 MG PO TABS: 60.0000 mg | ORAL_TABLET | Freq: Two times a day (BID) | ORAL | Status: DC

## 2013-12-09 MED ORDER — HYDROXYZINE HCL 25 MG PO TABS: 25.0000 mg | ORAL_TABLET | Freq: Three times a day (TID) | ORAL | Status: DC | PRN

## 2013-12-09 NOTE — Progress Notes (Signed)
Patient ID: Bill [name], male   DOB: 04/01/1946, 68 y.o.   MRN: [id number]     [facility]  Allergies  Allergen Reactions  . Penicillins Other (See Comments)    unknown     Chief Complaint  Patient presents with  . Medical Managment of Chronic Issues    HPI:  He is being seen for the management of his chronic illnesses. His heart failure continues to decline; he continues to gain weight; he does have difficulty following the restrictions in regarding to avoiding salt and the fluid restriction. His renal function remains poor.     Past Medical History  Diagnosis Date  . COPD (chronic obstructive pulmonary disease)   . Diabetes mellitus without complication   . Hyperlipidemia   . Depression   . CHF (congestive heart failure)   . Gout   . OSA (obstructive sleep apnea)   . Pneumonia 07/26/2013  . CHF with unknown LVEF 08/11/2013  . Chronic respiratory failure 06/19/2013  . GERD (gastroesophageal reflux disease) 06/19/2013  . DM type 2 with diabetic peripheral neuropathy 06/19/2013  . BPH (benign prostatic hyperplasia) 06/19/2013  . AKI (acute kidney injury) 07/27/2013  . Urinary retention 08/11/2013  . Sepsis 07/27/2013  . Physical deconditioning 06/19/2013  . Perineal abscess 08/01/2013  . Depression, controlled 06/19/2013  . Anemia of chronic disease 07/31/2013    No past surgical history on file.  VITAL SIGNS BP 132/86  Pulse 79  Ht 5\' 11"  (1.803 m)  Wt 299 lb (135.626 kg)  BMI 41.72 kg/m2   Patient's Medications  New Prescriptions   No medications on file  Previous Medications   ACETAMINOPHEN (TYLENOL) 325 MG TABLET    Take 650 mg by mouth every 4 (four) hours as needed for mild pain.   ALBUTEROL (PROVENTIL HFA;VENTOLIN HFA) 108 (90 BASE) MCG/ACT INHALER    Inhale 2 puffs into the lungs every 6 (six) hours as needed for wheezing or shortness of breath.    ALBUTEROL (PROVENTIL) (2.5 MG/3ML) 0.083% NEBULIZER SOLUTION    Take 2.5 mg by nebulization 2 (two)  times daily as needed for wheezing or shortness of breath.    ALLOPURINOL (ZYLOPRIM) 300 MG TABLET    Take 300 mg by mouth 2 (two) times daily.   BUDESONIDE-FORMOTEROL (SYMBICORT) 160-4.5 MCG/ACT INHALER    Inhale 2 puffs into the lungs 2 (two) times daily.   CITALOPRAM (CELEXA) 40 MG TABLET    Take 40 mg by mouth daily.   CLONAZEPAM (KLONOPIN) 2 MG TABLET    Take 1 mg by mouth 2 (two) times daily as needed.    CRANBERRY 250 MG CAPS    Take 1 capsule by mouth 2 (two) times daily.   FERROUS SULFATE 325 (65 FE) MG TABLET    Take 325 mg by mouth 3 (three) times daily with meals.   FLUTICASONE (FLONASE) 50 MCG/ACT NASAL SPRAY    Place 2 sprays into both nostrils daily.   GABAPENTIN (NEURONTIN) 100 MG CAPSULE    Take 100 mg by mouth 2 (two) times daily.   HYDROCODONE-ACETAMINOPHEN (NORCO/VICODIN) 5-325 MG PER TABLET    Take 1 tablet by mouth every 4 (four) hours as needed for moderate pain.   INSULIN DETEMIR (LEVEMIR) 100 UNIT/ML INJECTION    Inject 0.4 mLs (40 Units total) into the skin daily.   INSULIN LISPRO (HUMALOG) 100 UNIT/ML INJECTION    Inject 2-10 Units into the skin 4 (four) times daily -  before meals and at bedtime. Sliding scale  LISINOPRIL (PRINIVIL,ZESTRIL) 10 MG TABLET    Take 1 tablet (10 mg total) by mouth daily.   OLANZAPINE (ZYPREXA) 10 MG TABLET    Take 5 mg by mouth daily.    PANTOPRAZOLE (PROTONIX) 40 MG TABLET    Take 40 mg by mouth 2 (two) times daily.   POLYETHYLENE GLYCOL (MIRALAX / GLYCOLAX) PACKET    Take 17 g by mouth daily.   PREDNISONE (DELTASONE) 10 MG TABLET    Take 10 mg by mouth daily with breakfast.   PRIMIDONE (MYSOLINE) 250 MG TABLET    Take 250 mg by mouth 2 (two) times daily.   SENNOSIDES-DOCUSATE SODIUM (SENOKOT-S) 8.6-50 MG TABLET    Take 1 tablet by mouth 2 (two) times daily.   SITAGLIPTIN-METFORMIN (JANUMET) 50-500 MG PER TABLET    Take 1 tablet by mouth 2 (two) times daily with a meal.   SPIRONOLACTONE (ALDACTONE) 25 MG TABLET    Take 1 tablet (25 mg  total) by mouth 2 (two) times daily.   TAMSULOSIN (FLOMAX) 0.4 MG CAPS CAPSULE    Take 0.4 mg by mouth daily.   TIOTROPIUM (SPIRIVA) 18 MCG INHALATION CAPSULE    Place 18 mcg into inhaler and inhale daily.   TORSEMIDE (DEMADEX) 20 MG TABLET    Take 2 tablets (40 mg total) by mouth 2 (two) times daily.   TRAMADOL (ULTRAM) 50 MG TABLET    Take 1 tablet by mouth every 12 hours as needed for pain.   ZOLPIDEM (AMBIEN) 10 MG TABLET    Take 10 mg by mouth at bedtime as needed for sleep.  Modified Medications   No medications on file  Discontinued Medications   No medications on file    SIGNIFICANT DIAGNOSTIC EXAMS  07-26-13: chest x-ray: minimal bibasilar pneumonitis with superimposed mild congestive changes.   08-04-13: kub: bowel gas pattern could be secondary to partial bowel obstruction versus moderate to severe ileus. No abnormal calcifications in the region of the abdomen   08-10-13: ct of pelvis: 4.1 x 2.4 cm left perineal/scrotal abscess. Associated foci of soft tissue gas, correlate for recent intervention.  Additional 4.3 x 2.3 cm left gluteal fluid collection, suspicious  for early/developing abscess.  Associated mild subcutaneous stranding in the left gluteal region,  likely reflecting cellulitis.  Mildly thick-walled bladder, correlate for cystitis.  11-09-13: chest x-ray: consistent with chronic chf; findings appear similar to previous studies. There is a question of slight progression of chf.    LAS REVIEWED:   07-10-13: glucose 207; bun 18; creat 1.0; k+ 3.8; na++ 137  07-24-13: glucose 296; bun 24; creat 1.4; k+3.6; na++ 134 07-26-13: wbc 15.5; hgb 9.8; hct 34.1; mcv 85; plt 254; glucose 229; bun 25; creat 1.2; k+3.7;na++ 133 07-31-13: wbc 13.0; hgb 9.2; hct 32.8; mcv 85; plt 267; glucose 136;bun 26; creat 1.0; k+3.9; na++ 137; liver normal albumin 2.7 hgb a1c 7.7  08-02-13: glucose 97; bun 30;creat 1.2; k+4.0; na++ 140;  08-03-13: wbc 26.2; hgb 10.4; hct 35.9; mcv 85.1 plt 289;  glucose 158; bun 29;creat 1.2;k+3.9; na++138  08-09-13: wbc 18.3; hgb 10.3; hct 37.6; mcv 86.2 plt 318  08-21-13: wbc 8.8; hgb 10.8; hct 38.3; mcv 85.1;plt 316; glucose 106; bun 13; creat 0.9; k+3.9; na++138 10-23-13: BNP: 17 10-27-13: glucose 140; bun 31; creat 1.1; k+4.2; na++140  11-06-13: glucose 98; bun 83; creat 1.9; k+4.7; na++133 11-10-13: glucose 141; bun 79; creat 1.8; k+4.8; na++135; BNP 15 11-13-13: liver normal albumin 4.0; pre-albumin 40.19   11-17-13: glucose 84; bun  87; creat 1.6; k+4.4; na++141        .Review of Systems  Constitutional: Negative for malaise/fatigue.  HENT: negative for congestion      Respiratory: negative for cough . HAS  shortness of breath and wheezing Cardiovascular: Negative for chest pain, palpitations HAS EDEMA   Gastrointestinal: Negative for heartburn, abdominal pain and constipation.  Musculoskeletal: negative for pint pain . Negative for myalgias.  Skin: negative   Neurological: Negative for dizziness, weakness and headaches.  Psychiatric/Behavioral: Negative for depression. The patient is not nervous/anxious and does not have insomnia.     Physical Exam  Constitutional: He is oriented to person, place, and time. He appears well-developed and well-nourished. No distress.  obese  Neck: Neck supple. No JVD present. No thyromegaly present.  Cardiovascular: Normal rate, regular rhythm and intact distal pulses.   Respiratory: Effort normal and breath sounds normal. No respiratory distress. Has wheezes.  GI: Soft. Bowel sounds are normal. He exhibits no distension. There is no tenderness.  Musculoskeletal: Normal range of motion. Has 2+ -3+ edema present .   Neurological: He is alert and oriented to person, place, and time.  Skin: Skin is warm. He is not diaphoretic.  Psychiatric: He has a normal mood and affect.       ASSESSMENT/ PLAN:   1. Hypertension: stable will  lisinopril 10 mg daily his norvasc was stopped due to his heart failure.     2. COPD: is without change in status; will continue use of 02; chronic use of prednisone 10 mg daily; symbicort 160/4.5 2 puffs twice daily; spiriva daily  albuterol neb four times daily prn; flonase daily will monitor   3. CHF: his status is without change; will continue 1800 cc fluid restriction; will continue aldactone 25 mg twice daily will increase his demadex to 60 mg twice daily and will check bmp in one week   4. Diabetes: is stable will continue levemir 40 units daily; janumet 50/500 mg twice daily and novolog 5 units prior to meals for cbg >=150 will monitor   5. Gout: is stable no recent flares; will continue allopurinol 300 mg twice daily; and colchicine 0.6 mg daily as needed and will monitor   6. Essential tremor: is presently stable will continue primidone 250 mg twice daily and will monitor   7. PVD: no complaints of pain is taking neurontin 100 mg twice daily has ultram 50 mg twice daily as needed  will monitor   8. Anemia: is stable will continue iron 3 times daily  9. Gerd: will continue protonix 40 mg twice daily   10. Constipation: will continue miralax daily; and senna s twice daily   11. BPH: will continue flomax 0.4 mg daily   12. Depression and anxiety: he is emotionally stable is followed by [facility]. Will continue celexa 40 mg daily; zyprexa 5 mg daily; and has klonopin 1 mg twice daily as needed will monitor       Synthia Innocenteborah Shelise Maron NP [facility]  Contact [phone] Monday through Friday 8am- 5pm  After hours call [phone]

## 2013-12-11 ENCOUNTER — Encounter: Payer: Self-pay | Admitting: Adult Health

## 2013-12-11 ENCOUNTER — Non-Acute Institutional Stay (SKILLED_NURSING_FACILITY): Payer: Medicare HMO | Admitting: Adult Health

## 2013-12-11 DIAGNOSIS — J189 Pneumonia, unspecified organism: Secondary | ICD-10-CM

## 2013-12-11 DIAGNOSIS — N049 Nephrotic syndrome with unspecified morphologic changes: Secondary | ICD-10-CM

## 2013-12-11 DIAGNOSIS — I5032 Chronic diastolic (congestive) heart failure: Secondary | ICD-10-CM | POA: Insufficient documentation

## 2013-12-11 DIAGNOSIS — K5900 Constipation, unspecified: Secondary | ICD-10-CM

## 2013-12-11 DIAGNOSIS — N184 Chronic kidney disease, stage 4 (severe): Secondary | ICD-10-CM

## 2013-12-11 LAB — CBC AND DIFFERENTIAL
HEMATOCRIT: 36 % — AB (ref 41–53)
HEMOGLOBIN: 10.6 g/dL — AB (ref 13.5–17.5)
Platelets: 236 10*3/uL (ref 150–399)
WBC: 10.5 10*3/mL

## 2013-12-11 LAB — BASIC METABOLIC PANEL
BUN: 76 mg/dL — AB (ref 4–21)
CREATININE: 1.7 mg/dL — AB (ref 0.6–1.3)
Glucose: 95 mg/dL
Potassium: 4.7 mmol/L (ref 3.4–5.3)
Sodium: 138 mmol/L (ref 137–147)

## 2013-12-11 LAB — PHOSPHORUS: Phosphorus: 4.6 mg/dL (ref 2.5–4.9)

## 2013-12-11 LAB — MAGNESIUM: Magnesium: 1.9 mg/dL (ref 1.6–2.4)

## 2013-12-11 LAB — HEPATIC FUNCTION PANEL
ALT: 17 U/L (ref 10–40)
AST: 12 U/L — AB (ref 14–40)
Alkaline Phosphatase: 124 U/L (ref 25–125)
Bilirubin, Total: 0.2 mg/dL

## 2013-12-11 MED ORDER — ALLOPURINOL 300 MG PO TABS: 300.0000 mg | ORAL_TABLET | Freq: Every day | ORAL | Status: DC

## 2013-12-11 MED ORDER — PREDNISONE 10 MG PO TABS: 5.0000 mg | ORAL_TABLET | Freq: Every day | ORAL | Status: AC

## 2013-12-11 MED ORDER — LINAGLIPTIN 5 MG PO TABS: 5.0000 mg | ORAL_TABLET | Freq: Every day | ORAL | Status: DC

## 2013-12-11 MED ORDER — SPIRONOLACTONE 25 MG PO TABS: 50.0000 mg | ORAL_TABLET | Freq: Two times a day (BID) | ORAL | Status: DC

## 2013-12-11 MED ORDER — PANTOPRAZOLE SODIUM 40 MG PO TBEC: 40.0000 mg | DELAYED_RELEASE_TABLET | Freq: Every day | ORAL | Status: DC

## 2013-12-11 NOTE — Progress Notes (Signed)
Patient ID: Bill MeekerJesse C [name], male   DOB: 11-30-1945, 68 y.o.   MRN: [id number]     [facility]   Allergies  Allergen Reactions  . Penicillins Other (See Comments)    unknown     Chief Complaint  Patient presents with  . Acute Visit    follow up patient status     HPI:  He continues to gain weight from 12-05-13 through today he has gained from 294 pounds to 305 pounds. He is more short of breath; and states he is feeling worse. His renal function is worsening as well. I have spoken with the pharmacy regarding his medications and renal function to better utilize his medications and manage his renal function.   Past Medical History  Diagnosis Date  . COPD (chronic obstructive pulmonary disease)   . Diabetes mellitus without complication   . Hyperlipidemia   . Depression   . CHF (congestive heart failure)   . Gout   . OSA (obstructive sleep apnea)   . Pneumonia 07/26/2013  . CHF with unknown LVEF 08/11/2013  . Chronic respiratory failure 06/19/2013  . GERD (gastroesophageal reflux disease) 06/19/2013  . DM type 2 with diabetic peripheral neuropathy 06/19/2013  . BPH (benign prostatic hyperplasia) 06/19/2013  . AKI (acute kidney injury) 07/27/2013  . Urinary retention 08/11/2013  . Sepsis 07/27/2013  . Physical deconditioning 06/19/2013  . Perineal abscess 08/01/2013  . Depression, controlled 06/19/2013  . Anemia of chronic disease 07/31/2013    No past surgical history on file.  VITAL SIGNS BP 136/63  Pulse 70  Ht 5\' 11"  (1.803 m)  Wt 305 lb 6.4 oz (138.529 kg)  BMI 42.61 kg/m2   Patient's Medications  New Prescriptions   No medications on file  Previous Medications   ACETAMINOPHEN (TYLENOL) 325 MG TABLET    Take 650 mg by mouth every 4 (four) hours as needed for mild pain.   ALBUTEROL (PROVENTIL HFA;VENTOLIN HFA) 108 (90 BASE) MCG/ACT INHALER    Inhale 2 puffs into the lungs every 6 (six) hours as needed for wheezing or shortness of breath.    ALBUTEROL  (PROVENTIL) (2.5 MG/3ML) 0.083% NEBULIZER SOLUTION    Take 2.5 mg by nebulization 2 (two) times daily as needed for wheezing or shortness of breath.    ALLOPURINOL (ZYLOPRIM) 300 MG TABLET    Take 300 mg by mouth 2 (two) times daily.   BUDESONIDE-FORMOTEROL (SYMBICORT) 160-4.5 MCG/ACT INHALER    Inhale 2 puffs into the lungs 2 (two) times daily.   CITALOPRAM (CELEXA) 40 MG TABLET    Take 40 mg by mouth daily.   CLONAZEPAM (KLONOPIN) 2 MG TABLET    Take 1 mg by mouth 2 (two) times daily as needed. Take three tablets by mouth every evening as needed for severe anxiety   CRANBERRY 250 MG CAPS    Take 1 capsule by mouth 2 (two) times daily.   FERROUS SULFATE 325 (65 FE) MG TABLET    Take 325 mg by mouth 3 (three) times daily with meals.   FLUTICASONE (FLONASE) 50 MCG/ACT NASAL SPRAY    Place 2 sprays into both nostrils daily.   GABAPENTIN (NEURONTIN) 100 MG CAPSULE    Take 100 mg by mouth 2 (two) times daily.   HYDROCODONE-ACETAMINOPHEN (NORCO/VICODIN) 5-325 MG PER TABLET    Take 1 tablet by mouth every 4 (four) hours as needed for moderate pain.   HYDROXYZINE (ATARAX/VISTARIL) 25 MG TABLET    Take 1 tablet (25 mg total) by mouth  every 8 (eight) hours as needed.   INSULIN DETEMIR (LEVEMIR) 100 UNIT/ML INJECTION    Inject 0.4 mLs (40 Units total) into the skin daily.   INSULIN LISPRO (HUMALOG) 100 UNIT/ML INJECTION    Inject 2-10 Units into the skin 4 (four) times daily -  before meals and at bedtime. Sliding scale   LISINOPRIL (PRINIVIL,ZESTRIL) 10 MG TABLET    Take 1 tablet (10 mg total) by mouth daily.   OLANZAPINE (ZYPREXA) 10 MG TABLET    Take 5 mg by mouth daily.    PANTOPRAZOLE (PROTONIX) 40 MG TABLET    Take 40 mg by mouth 2 (two) times daily.   POLYETHYLENE GLYCOL (MIRALAX / GLYCOLAX) PACKET    Take 17 g by mouth daily.   PREDNISONE (DELTASONE) 10 MG TABLET    Take 10 mg by mouth daily with breakfast.   PRIMIDONE (MYSOLINE) 250 MG TABLET    Take 250 mg by mouth 2 (two) times daily.    SENNOSIDES-DOCUSATE SODIUM (SENOKOT-S) 8.6-50 MG TABLET    Take 1 tablet by mouth 2 (two) times daily.   SITAGLIPTIN-METFORMIN (JANUMET) 50-500 MG PER TABLET    Take 1 tablet by mouth 2 (two) times daily with a meal.   SPIRONOLACTONE (ALDACTONE) 25 MG TABLET    Take 1 tablet (25 mg total) by mouth 2 (two) times daily.   TAMSULOSIN (FLOMAX) 0.4 MG CAPS CAPSULE    Take 0.4 mg by mouth daily.   TIOTROPIUM (SPIRIVA) 18 MCG INHALATION CAPSULE    Place 18 mcg into inhaler and inhale daily.   TORSEMIDE (DEMADEX) 20 MG TABLET    Take 3 tablets (60 mg total) by mouth 2 (two) times daily.   TRAMADOL (ULTRAM) 50 MG TABLET    Take 1 tablet by mouth every 12 hours as needed for pain.   ZOLPIDEM (AMBIEN) 10 MG TABLET    Take 10 mg by mouth at bedtime as needed for sleep.  Modified Medications   No medications on file  Discontinued Medications   No medications on file    SIGNIFICANT DIAGNOSTIC EXAMS  07-26-13: chest x-ray: minimal bibasilar pneumonitis with superimposed mild congestive changes.   08-04-13: kub: bowel gas pattern could be secondary to partial bowel obstruction versus moderate to severe ileus. No abnormal calcifications in the region of the abdomen   08-10-13: ct of pelvis: 4.1 x 2.4 cm left perineal/scrotal abscess. Associated foci of soft tissue gas, correlate for recent intervention.  Additional 4.3 x 2.3 cm left gluteal fluid collection, suspicious  for early/developing abscess.  Associated mild subcutaneous stranding in the left gluteal region,  likely reflecting cellulitis.  Mildly thick-walled bladder, correlate for cystitis.  11-09-13: chest x-ray: consistent with chronic chf; findings appear similar to previous studies. There is a question of slight progression of chf.   11-24-13: TEE: Left ventricle: The cavity size was mildly dilated. Systolic function was normal. The estimated ejection fraction was in the range of 55% to 60%. Wall motion was normal; there were no regional wall  motion abnormalities.Doppler parameters are consistent with abnormal left ventricular relaxation (grade 1 diastolic dysfunction).    LAS REVIEWED:   07-10-13: glucose 207; bun 18; creat 1.0; k+ 3.8; na++ 137  07-24-13: glucose 296; bun 24; creat 1.4; k+3.6; na++ 134 07-26-13: wbc 15.5; hgb 9.8; hct 34.1; mcv 85; plt 254; glucose 229; bun 25; creat 1.2; k+3.7;na++ 133 07-31-13: wbc 13.0; hgb 9.2; hct 32.8; mcv 85; plt 267; glucose 136;bun 26; creat 1.0; k+3.9; na++ 137; liver normal  albumin 2.7 hgb a1c 7.7  08-02-13: glucose 97; bun 30;creat 1.2; k+4.0; na++ 140;  08-03-13: wbc 26.2; hgb 10.4; hct 35.9; mcv 85.1 plt 289; glucose 158; bun 29;creat 1.2;k+3.9; na++138  08-09-13: wbc 18.3; hgb 10.3; hct 37.6; mcv 86.2 plt 318  08-21-13: wbc 8.8; hgb 10.8; hct 38.3; mcv 85.1;plt 316; glucose 106; bun 13; creat 0.9; k+3.9; na++138 10-23-13: BNP: 17 10-27-13: glucose 140; bun 31; creat 1.1; k+4.2; na++140  11-06-13: glucose 98; bun 83; creat 1.9; k+4.7; na++133 11-10-13: glucose 141; bun 79; creat 1.8; k+4.8; na++135; BNP 15 11-13-13: liver normal albumin 4.0; pre-albumin 40.19   11-17-13: glucose 84; bun 87; creat 1.6; k+4.4; na++141  12-06-13: glucose 125; bun 73; creat 2.3; k+4.3; na++135      .Review of Systems  Constitutional: Negative for malaise/fatigue.  HENT: negative for congestion      Respiratory: negative for cough . HAS  shortness of breath and wheezing Cardiovascular: Negative for chest pain, palpitations HAS EDEMA   Gastrointestinal: Negative for heartburn, abdominal pain and constipation.  Musculoskeletal: negative for pint pain . Negative for myalgias.  Skin: negative   Neurological: Negative for dizziness, weakness and headaches.  Psychiatric/Behavioral: Negative for depression. The patient is not nervous/anxious and does not have insomnia.     Physical Exam  Constitutional: He is oriented to person, place, and time. He appears well-developed and well-nourished. No distress.    obese  Neck: Neck supple. No JVD present. No thyromegaly present.  Cardiovascular: Normal rate, regular rhythm and intact distal pulses.   Respiratory: Effort normal and breath sounds normal. No respiratory distress. Has wheezes.  GI: Soft. Bowel sounds are normal. He exhibits no distension. There is no tenderness.  Musculoskeletal: Normal range of motion. Has 2+ -3+ edema present .   Neurological: He is alert and oriented to person, place, and time.  Skin: Skin is warm. He is not diaphoretic.  Psychiatric: He has a normal mood and affect.       ASSESSMENT/ PLAN:  1. CKD stage IV 2. CHF 3. Gout 4. Diabetes Will lower the allopurinol to 300 mg daily Will stop the janumet Will lower the prednisone to 5 mg daily Will lower the protonix to 40 mg daily  Due to worsening renal function  Will begin tradjenta 5 mg daiy for diabetes Will increase aldactone to 50 mg twice daily Will add renal restriction to diet Will check cbc; cmp; mag; phos on 12-11-13  Will continue to monitor his status    Time spent with patient 50 minutes.       Synthia Innocent NP [facility]  Contact [phone] Monday through Friday 8am- 5pm  After hours call [phone]

## 2013-12-12 ENCOUNTER — Other Ambulatory Visit: Payer: Self-pay | Admitting: Internal Medicine

## 2013-12-12 DIAGNOSIS — N289 Disorder of kidney and ureter, unspecified: Secondary | ICD-10-CM

## 2013-12-14 DIAGNOSIS — N184 Chronic kidney disease, stage 4 (severe): Secondary | ICD-10-CM | POA: Insufficient documentation

## 2013-12-14 DIAGNOSIS — J189 Pneumonia, unspecified organism: Secondary | ICD-10-CM | POA: Insufficient documentation

## 2013-12-14 DIAGNOSIS — N049 Nephrotic syndrome with unspecified morphologic changes: Secondary | ICD-10-CM | POA: Insufficient documentation

## 2013-12-14 NOTE — Progress Notes (Signed)
Patient ID: Bill MeekerJesse C [name], male   DOB: 11-14-1945, 68 y.o.   MRN: [id number]     [facility]  Allergies  Allergen Reactions  . Penicillins Other (See Comments)    unknown     Chief Complaint  Patient presents with  . Acute Visit    follow up on status     HPI:  His chest x-ray from 12-08-13; demonstrates possible new pneumonia. He is feeling worse. He states he feels weaker. He states his urine volume has decreased over the past several day. He is having increased shortness of breath and increased fatigue. He is also complaining of constipation.   Past Medical History  Diagnosis Date  . COPD (chronic obstructive pulmonary disease)   . Diabetes mellitus without complication   . Hyperlipidemia   . Depression   . CHF (congestive heart failure)   . Gout   . OSA (obstructive sleep apnea)   . Pneumonia 07/26/2013  . CHF with unknown LVEF 08/11/2013  . Chronic respiratory failure 06/19/2013  . GERD (gastroesophageal reflux disease) 06/19/2013  . DM type 2 with diabetic peripheral neuropathy 06/19/2013  . BPH (benign prostatic hyperplasia) 06/19/2013  . AKI (acute kidney injury) 07/27/2013  . Urinary retention 08/11/2013  . Sepsis 07/27/2013  . Physical deconditioning 06/19/2013  . Perineal abscess 08/01/2013  . Depression, controlled 06/19/2013  . Anemia of chronic disease 07/31/2013    No past surgical history on file.  VITAL SIGNS BP 119/79  Pulse 80  Ht 5\' 11"  (1.803 m)  Wt 302 lb (136.986 kg)  BMI 42.14 kg/m2   Patient's Medications  New Prescriptions   No medications on file  Previous Medications   ACETAMINOPHEN (TYLENOL) 325 MG TABLET    Take 650 mg by mouth every 4 (four) hours as needed for mild pain.   ALBUTEROL (PROVENTIL HFA;VENTOLIN HFA) 108 (90 BASE) MCG/ACT INHALER    Inhale 2 puffs into the lungs every 6 (six) hours as needed for wheezing or shortness of breath.    ALBUTEROL (PROVENTIL) (2.5 MG/3ML) 0.083% NEBULIZER SOLUTION    Take 2.5 mg by  nebulization 2 (two) times daily as needed for wheezing or shortness of breath.    ALLOPURINOL (ZYLOPRIM) 300 MG TABLET    Take 1 tablet (300 mg total) by mouth daily.   BUDESONIDE-FORMOTEROL (SYMBICORT) 160-4.5 MCG/ACT INHALER    Inhale 2 puffs into the lungs 2 (two) times daily.   CITALOPRAM (CELEXA) 40 MG TABLET    Take 40 mg by mouth daily.   CLONAZEPAM (KLONOPIN) 2 MG TABLET    Take 1 mg by mouth 2 (two) times daily as needed. Take three tablets by mouth every evening as needed for severe anxiety   CRANBERRY 250 MG CAPS    Take 1 capsule by mouth 2 (two) times daily.   FERROUS SULFATE 325 (65 FE) MG TABLET    Take 325 mg by mouth 3 (three) times daily with meals.   FLUTICASONE (FLONASE) 50 MCG/ACT NASAL SPRAY    Place 2 sprays into both nostrils daily.   GABAPENTIN (NEURONTIN) 100 MG CAPSULE    Take 100 mg by mouth 2 (two) times daily.   HYDROCODONE-ACETAMINOPHEN (NORCO/VICODIN) 5-325 MG PER TABLET    Take 1 tablet by mouth every 4 (four) hours as needed for moderate pain.   HYDROXYZINE (ATARAX/VISTARIL) 25 MG TABLET    Take 1 tablet (25 mg total) by mouth every 8 (eight) hours as needed.   INSULIN DETEMIR (LEVEMIR) 100 UNIT/ML INJECTION  Inject 0.4 mLs (40 Units total) into the skin daily.   INSULIN LISPRO (HUMALOG) 100 UNIT/ML INJECTION    Inject 2-10 Units into the skin 4 (four) times daily -  before meals and at bedtime. Sliding scale   LINAGLIPTIN (TRADJENTA) 5 MG TABS TABLET    Take 1 tablet (5 mg total) by mouth daily.   LISINOPRIL (PRINIVIL,ZESTRIL) 10 MG TABLET    Take 1 tablet (10 mg total) by mouth daily.   OLANZAPINE (ZYPREXA) 10 MG TABLET    Take 5 mg by mouth daily.    PANTOPRAZOLE (PROTONIX) 40 MG TABLET    Take 1 tablet (40 mg total) by mouth daily.   POLYETHYLENE GLYCOL (MIRALAX / GLYCOLAX) PACKET    Take 17 g by mouth daily.   PREDNISONE (DELTASONE) 10 MG TABLET    Take 0.5 tablets (5 mg total) by mouth daily with breakfast.   PRIMIDONE (MYSOLINE) 250 MG TABLET    Take 250  mg by mouth 2 (two) times daily.   SENNOSIDES-DOCUSATE SODIUM (SENOKOT-S) 8.6-50 MG TABLET    Take 1 tablet by mouth 2 (two) times daily.   SPIRONOLACTONE (ALDACTONE) 25 MG TABLET    Take 2 tablets (50 mg total) by mouth 2 (two) times daily.   TAMSULOSIN (FLOMAX) 0.4 MG CAPS CAPSULE    Take 0.4 mg by mouth daily.   TIOTROPIUM (SPIRIVA) 18 MCG INHALATION CAPSULE    Place 18 mcg into inhaler and inhale daily.   TORSEMIDE (DEMADEX) 20 MG TABLET    Take 3 tablets (60 mg total) by mouth 2 (two) times daily.   TRAMADOL (ULTRAM) 50 MG TABLET    Take 1 tablet by mouth every 12 hours as needed for pain.   ZOLPIDEM (AMBIEN) 10 MG TABLET    Take 10 mg by mouth at bedtime as needed for sleep.  Modified Medications   No medications on file  Discontinued Medications   No medications on file    SIGNIFICANT DIAGNOSTIC EXAMS  07-26-13: chest x-ray: minimal bibasilar pneumonitis with superimposed mild congestive changes.   08-04-13: kub: bowel gas pattern could be secondary to partial bowel obstruction versus moderate to severe ileus. No abnormal calcifications in the region of the abdomen   08-10-13: ct of pelvis: 4.1 x 2.4 cm left perineal/scrotal abscess. Associated foci of soft tissue gas, correlate for recent intervention.  Additional 4.3 x 2.3 cm left gluteal fluid collection, suspicious  for early/developing abscess.  Associated mild subcutaneous stranding in the left gluteal region,  likely reflecting cellulitis.  Mildly thick-walled bladder, correlate for cystitis.  11-09-13: chest x-ray: consistent with chronic chf; findings appear similar to previous studies. There is a question of slight progression of chf.   11-24-13: TEE: Left ventricle: The cavity size was mildly dilated. Systolic function was normal. The estimated ejection fraction was in the range of 55% to 60%. Wall motion was normal; there were no regional wall motion abnormalities.Doppler parameters are consistent with abnormal left  ventricular relaxation (grade 1 diastolic dysfunction).  12-08-13: chest x-ray: minimal cardiomegaly without change with minimal pulmonary vascular congestion without change. No pleural effusion. Patchy bilateral atlectasis  or pneumonia appears new.      LAS REVIEWED:   07-10-13: glucose 207; bun 18; creat 1.0; k+ 3.8; na++ 137  07-24-13: glucose 296; bun 24; creat 1.4; k+3.6; na++ 134 07-26-13: wbc 15.5; hgb 9.8; hct 34.1; mcv 85; plt 254; glucose 229; bun 25; creat 1.2; k+3.7;na++ 133 07-31-13: wbc 13.0; hgb 9.2; hct 32.8; mcv 85; plt  267; glucose 136;bun 26; creat 1.0; k+3.9; na++ 137; liver normal albumin 2.7 hgb a1c 7.7  08-02-13: glucose 97; bun 30;creat 1.2; k+4.0; na++ 140;  08-03-13: wbc 26.2; hgb 10.4; hct 35.9; mcv 85.1 plt 289; glucose 158; bun 29;creat 1.2;k+3.9; na++138  08-09-13: wbc 18.3; hgb 10.3; hct 37.6; mcv 86.2 plt 318  08-21-13: wbc 8.8; hgb 10.8; hct 38.3; mcv 85.1;plt 316; glucose 106; bun 13; creat 0.9; k+3.9; na++138 10-23-13: BNP: 17 10-27-13: glucose 140; bun 31; creat 1.1; k+4.2; na++140  11-06-13: glucose 98; bun 83; creat 1.9; k+4.7; na++133 11-10-13: glucose 141; bun 79; creat 1.8; k+4.8; na++135; BNP 15 11-13-13: liver normal albumin 4.0; pre-albumin 40.19   11-17-13: glucose 84; bun 87; creat 1.6; k+4.4; na++141  12-06-13: glucose 125; bun 73; creat 2.3; k+4.3; na++135      .Review of Systems  Constitutional: Negative for malaise/fatigue.  HENT: negative for congestion      Respiratory: negative for cough . HAS  shortness of breath and wheezing Cardiovascular: Negative for chest pain, palpitations HAS EDEMA   Gastrointestinal: Negative for heartburn, abdominal pain and constipation.  Musculoskeletal: negative for pint pain . Negative for myalgias.  Skin: negative   Neurological: Negative for dizziness, weakness and headaches.  Psychiatric/Behavioral: Negative for depression. The patient is not nervous/anxious and does not have insomnia.     Physical Exam    Constitutional: He is oriented to person, place, and time. He appears well-developed and well-nourished. No distress.  obese  Neck: Neck supple. No JVD present. No thyromegaly present.  Cardiovascular: Normal rate, regular rhythm and intact distal pulses.   Respiratory: Effort normal and breath sounds normal. No respiratory distress. Has wheezes.  GI: Soft. Bowel sounds are normal. He exhibits no distension. There is no tenderness.  Musculoskeletal: Normal range of motion. Has 2+ -3+ edema present .   Neurological: He is alert and oriented to person, place, and time.  Skin: Skin is warm. He is not diaphoretic.  Psychiatric: He has a normal mood and affect.       ASSESSMENT/ PLAN:  1. CKD stage IV: will set up a renal ultrasound; a renal consult; will get a cbc; cmp; mag level and phos level. I am concerned about nephrotic syndrome with him.   2. Pneumonia: will being doxycycline 100 mg twice dialy for 10 days with florastor twice daily for 10 days and will monitor his status   3. Constipation; will increase his senna s to 2 tabs twice daily .   I have spoken with Dr. Glade LloydPandey regarding his status today.          Synthia Innocenteborah Cedricka Sackrider NP [facility]  Contact [phone] Monday through Friday 8am- 5pm  After hours call [phone]

## 2013-12-15 ENCOUNTER — Ambulatory Visit (HOSPITAL_COMMUNITY)
Admission: RE | Admit: 2013-12-15 | Discharge: 2013-12-15 | Disposition: A | Discharge status: Home / Self Care | Payer: Medicare HMO | Source: Ambulatory Visit | Attending: Internal Medicine | Admitting: Internal Medicine

## 2013-12-15 DIAGNOSIS — N289 Disorder of kidney and ureter, unspecified: Secondary | ICD-10-CM

## 2013-12-15 DIAGNOSIS — E119 Type 2 diabetes mellitus without complications: Secondary | ICD-10-CM | POA: Insufficient documentation

## 2013-12-15 DIAGNOSIS — N400 Enlarged prostate without lower urinary tract symptoms: Secondary | ICD-10-CM | POA: Insufficient documentation

## 2013-12-15 DIAGNOSIS — N184 Chronic kidney disease, stage 4 (severe): Secondary | ICD-10-CM | POA: Insufficient documentation

## 2013-12-18 ENCOUNTER — Non-Acute Institutional Stay (SKILLED_NURSING_FACILITY): Payer: Medicare HMO | Admitting: Adult Health

## 2013-12-18 DIAGNOSIS — N184 Chronic kidney disease, stage 4 (severe): Secondary | ICD-10-CM

## 2013-12-18 DIAGNOSIS — I150 Renovascular hypertension: Secondary | ICD-10-CM

## 2013-12-20 ENCOUNTER — Ambulatory Visit: Payer: Self-pay | Admitting: Ophthalmology

## 2013-12-20 LAB — HEMOGLOBIN: HGB: 10.2 g/dL — ABNORMAL LOW (ref 13.0–18.0)

## 2013-12-20 LAB — POTASSIUM: POTASSIUM: 5 mmol/L (ref 3.5–5.1)

## 2013-12-25 LAB — BASIC METABOLIC PANEL
BUN: 60 mg/dL — AB (ref 4–21)
Creatinine: 1.3 mg/dL (ref 0.6–1.3)
GLUCOSE: 113 mg/dL
Potassium: 4.4 mmol/L (ref 3.4–5.3)
Sodium: 139 mmol/L (ref 137–147)

## 2013-12-27 LAB — BASIC METABOLIC PANEL
BUN: 48 mg/dL — AB (ref 4–21)
Creatinine: 1.3 mg/dL (ref 0.6–1.3)
Glucose: 115 mg/dL
Potassium: 4.3 mmol/L (ref 3.4–5.3)
Sodium: 139 mmol/L (ref 137–147)

## 2013-12-27 LAB — CBC AND DIFFERENTIAL
HEMATOCRIT: 33 % — AB (ref 41–53)
Hemoglobin: 10.9 g/dL — AB (ref 13.5–17.5)
PLATELETS: 211 10*3/uL (ref 150–399)
WBC: 11.2 10*3/mL

## 2013-12-27 LAB — PTH, INTACT: PTH: 97 pg/mL

## 2013-12-27 LAB — HEPATIC FUNCTION PANEL
ALK PHOS: 167 U/L — AB (ref 25–125)
ALT: 22 U/L (ref 10–40)
AST: 16 U/L (ref 14–40)
BILIRUBIN, TOTAL: 0.2 mg/dL

## 2013-12-27 LAB — HEMOGLOBIN A1C: Hgb A1c MFr Bld: 5.9 % (ref 4.0–6.0)

## 2013-12-27 LAB — PHOSPHORUS: Phosphorus: 3.4 mg/dL (ref 2.5–4.9)

## 2013-12-27 LAB — VITAMIN D 25 HYDROXY (VIT D DEFICIENCY, FRACTURES): VIT D 25 HYDROXY: 12.8

## 2013-12-28 ENCOUNTER — Encounter: Payer: Self-pay | Admitting: Adult Health

## 2013-12-28 MED ORDER — ISOSORBIDE MONONITRATE ER 30 MG PO TB24: 30.0000 mg | ORAL_TABLET | Freq: Every day | ORAL | Status: AC

## 2013-12-28 NOTE — Progress Notes (Signed)
Patient ID: Bill [name], male   DOB: August 19, 1946, 68 y.o.   MRN: [id number]     [facility]  Allergies  Allergen Reactions  . Penicillins Other (See Comments)    unknown     Chief Complaint  Patient presents with  . Acute Visit    follow up renal recommendations     HPI:  Nephrology has recommended to stop his lisinopril due to his renal function. His blood pressure at this time with his current regimen is under control. Will stop his lisinopril due to his renal function and will add imdur in order to manage his blood pressure. He is not voicing any concerns at this time. Nursing staff is not voicing any concerns at this time.    Past Medical History  Diagnosis Date  . COPD (chronic obstructive pulmonary disease)   . Diabetes mellitus without complication   . Hyperlipidemia   . Depression   . CHF (congestive heart failure)   . Gout   . OSA (obstructive sleep apnea)   . Pneumonia 07/26/2013  . CHF with unknown LVEF 08/11/2013  . Chronic respiratory failure 06/19/2013  . GERD (gastroesophageal reflux disease) 06/19/2013  . DM type 2 with diabetic peripheral neuropathy 06/19/2013  . BPH (benign prostatic hyperplasia) 06/19/2013  . AKI (acute kidney injury) 07/27/2013  . Urinary retention 08/11/2013  . Sepsis 07/27/2013  . Physical deconditioning 06/19/2013  . Perineal abscess 08/01/2013  . Depression, controlled 06/19/2013  . Anemia of chronic disease 07/31/2013    No past surgical history on file.  VITAL SIGNS BP 102/55  Pulse 79  Ht 5\' 11"  (1.803 m)  Wt 300 lb (136.079 kg)  BMI 41.86 kg/m2   Patient's Medications  New Prescriptions   No medications on file  Previous Medications   ACETAMINOPHEN (TYLENOL) 325 MG TABLET    Take 650 mg by mouth every 4 (four) hours as needed for mild pain.   ALBUTEROL (PROVENTIL HFA;VENTOLIN HFA) 108 (90 BASE) MCG/ACT INHALER    Inhale 2 puffs into the lungs every 6 (six) hours as needed for wheezing or shortness of breath.      ALBUTEROL (PROVENTIL) (2.5 MG/3ML) 0.083% NEBULIZER SOLUTION    Take 2.5 mg by nebulization 2 (two) times daily as needed for wheezing or shortness of breath.    ALLOPURINOL (ZYLOPRIM) 300 MG TABLET    Take 1 tablet (300 mg total) by mouth daily.   BUDESONIDE-FORMOTEROL (SYMBICORT) 160-4.5 MCG/ACT INHALER    Inhale 2 puffs into the lungs 2 (two) times daily.   CITALOPRAM (CELEXA) 40 MG TABLET    Take 40 mg by mouth daily.   CLONAZEPAM (KLONOPIN) 2 MG TABLET    Take 1 mg by mouth 2 (two) times daily as needed. Take three tablets by mouth every evening as needed for severe anxiety   CRANBERRY 250 MG CAPS    Take 1 capsule by mouth 2 (two) times daily.   FERROUS SULFATE 325 (65 FE) MG TABLET    Take 325 mg by mouth 3 (three) times daily with meals.   FLUTICASONE (FLONASE) 50 MCG/ACT NASAL SPRAY    Place 2 sprays into both nostrils daily.   GABAPENTIN (NEURONTIN) 100 MG CAPSULE    Take 100 mg by mouth 2 (two) times daily.   HYDROCODONE-ACETAMINOPHEN (NORCO/VICODIN) 5-325 MG PER TABLET    Take 1 tablet by mouth every 4 (four) hours as needed for moderate pain.   HYDROXYZINE (ATARAX/VISTARIL) 25 MG TABLET    Take 1 tablet (25  mg total) by mouth every 8 (eight) hours as needed.   INSULIN DETEMIR (LEVEMIR) 100 UNIT/ML INJECTION    Inject 0.4 mLs (40 Units total) into the skin daily.   INSULIN LISPRO (HUMALOG) 100 UNIT/ML INJECTION    Inject 2-10 Units into the skin 4 (four) times daily -  before meals and at bedtime. Sliding scale   LINAGLIPTIN (TRADJENTA) 5 MG TABS TABLET    Take 1 tablet (5 mg total) by mouth daily.   LISINOPRIL (PRINIVIL,ZESTRIL) 10 MG TABLET    Take 1 tablet (10 mg total) by mouth daily.   OLANZAPINE (ZYPREXA) 10 MG TABLET    Take 5 mg by mouth daily.    PANTOPRAZOLE (PROTONIX) 40 MG TABLET    Take 1 tablet (40 mg total) by mouth daily.   POLYETHYLENE GLYCOL (MIRALAX / GLYCOLAX) PACKET    Take 17 g by mouth daily.   PREDNISONE (DELTASONE) 10 MG TABLET    Take 0.5 tablets (5 mg  total) by mouth daily with breakfast.   PRIMIDONE (MYSOLINE) 250 MG TABLET    Take 250 mg by mouth 2 (two) times daily.   SENNOSIDES-DOCUSATE SODIUM (SENOKOT-S) 8.6-50 MG TABLET    Take 1 tablet by mouth 2 (two) times daily.   SPIRONOLACTONE (ALDACTONE) 25 MG TABLET    Take 2 tablets (50 mg total) by mouth 2 (two) times daily.   TAMSULOSIN (FLOMAX) 0.4 MG CAPS CAPSULE    Take 0.4 mg by mouth daily.   TIOTROPIUM (SPIRIVA) 18 MCG INHALATION CAPSULE    Place 18 mcg into inhaler and inhale daily.   TORSEMIDE (DEMADEX) 20 MG TABLET    Take 3 tablets (60 mg total) by mouth 2 (two) times daily.   TRAMADOL (ULTRAM) 50 MG TABLET    Take 1 tablet by mouth every 12 hours as needed for pain.   ZOLPIDEM (AMBIEN) 10 MG TABLET    Take 10 mg by mouth at bedtime as needed for sleep.  Modified Medications   No medications on file  Discontinued Medications   No medications on file    SIGNIFICANT DIAGNOSTIC EXAMS  07-26-13: chest x-ray: minimal bibasilar pneumonitis with superimposed mild congestive changes.   08-04-13: kub: bowel gas pattern could be secondary to partial bowel obstruction versus moderate to severe ileus. No abnormal calcifications in the region of the abdomen   08-10-13: ct of pelvis: 4.1 x 2.4 cm left perineal/scrotal abscess. Associated foci of soft tissue gas, correlate for recent intervention.  Additional 4.3 x 2.3 cm left gluteal fluid collection, suspicious  for early/developing abscess.  Associated mild subcutaneous stranding in the left gluteal region,  likely reflecting cellulitis.  Mildly thick-walled bladder, correlate for cystitis.  11-09-13: chest x-ray: consistent with chronic chf; findings appear similar to previous studies. There is a question of slight progression of chf.   11-24-13: TEE: Left ventricle: The cavity size was mildly dilated. Systolic function was normal. The estimated ejection fraction was in the range of 55% to 60%. Wall motion was normal; there were no  regional wall motion abnormalities.Doppler parameters are consistent with abnormal left ventricular relaxation (grade 1 diastolic dysfunction).  12-08-13: chest x-ray: minimal cardiomegaly without change with minimal pulmonary vascular congestion without change. No pleural effusion. Patchy bilateral atlectasis  or pneumonia appears new.      LAS REVIEWED:   07-10-13: glucose 207; bun 18; creat 1.0; k+ 3.8; na++ 137  07-24-13: glucose 296; bun 24; creat 1.4; k+3.6; na++ 134 07-26-13: wbc 15.5; hgb 9.8; hct 34.1; mcv  85; plt 254; glucose 229; bun 25; creat 1.2; k+3.7;na++ 133 07-31-13: wbc 13.0; hgb 9.2; hct 32.8; mcv 85; plt 267; glucose 136;bun 26; creat 1.0; k+3.9; na++ 137; liver normal albumin 2.7 hgb a1c 7.7  08-02-13: glucose 97; bun 30;creat 1.2; k+4.0; na++ 140;  08-03-13: wbc 26.2; hgb 10.4; hct 35.9; mcv 85.1 plt 289; glucose 158; bun 29;creat 1.2;k+3.9; na++138  08-09-13: wbc 18.3; hgb 10.3; hct 37.6; mcv 86.2 plt 318  08-21-13: wbc 8.8; hgb 10.8; hct 38.3; mcv 85.1;plt 316; glucose 106; bun 13; creat 0.9; k+3.9; na++138 10-23-13: BNP: 17 10-27-13: glucose 140; bun 31; creat 1.1; k+4.2; na++140  11-06-13: glucose 98; bun 83; creat 1.9; k+4.7; na++133 11-10-13: glucose 141; bun 79; creat 1.8; k+4.8; na++135; BNP 15 11-13-13: liver normal albumin 4.0; pre-albumin 40.19   11-17-13: glucose 84; bun 87; creat 1.6; k+4.4; na++141  12-06-13: glucose 125; bun 73; creat 2.3; k+4.3; na++135  12-11-13: wbc 10.5; hgb 10.6; hct 35.7; mcv 97.5 plt 236; glucose 95; bun 76; creat 1.7; k+4.7; na++138; liver normal albumin 3.9; mag 1.9; phos 4.6      .Review of Systems  Constitutional: Negative for malaise/fatigue.  HENT: negative for congestion      Respiratory: negative for cough .no   shortness of breath and wheezing Cardiovascular: Negative for chest pain, palpitations HAS EDEMA   Gastrointestinal: Negative for heartburn, abdominal pain and constipation.  Musculoskeletal: negative for pint pain .  Negative for myalgias.  Skin: negative   Neurological: Negative for dizziness, weakness and headaches.  Psychiatric/Behavioral: Negative for depression. The patient is not nervous/anxious and does not have insomnia.     Physical Exam  Constitutional: He is oriented to person, place, and time. He appears well-developed and well-nourished. No distress.  obese  Neck: Neck supple. No JVD present. No thyromegaly present.  Cardiovascular: Normal rate, regular rhythm and intact distal pulses.   Respiratory: Effort normal and breath sounds normal. No respiratory distress.no wheezes   GI: Soft. Bowel sounds are normal. He exhibits no distension. There is no tenderness.  Musculoskeletal: Normal range of motion. Has 2+ -3+ edema present .   Neurological: He is alert and oriented to person, place, and time.  Skin: Skin is warm. He is not diaphoretic.  Psychiatric: He has a normal mood and affect.         ASSESSMENT/ PLAN:  1. Stage IV CKD: is without change will stop his lisinopril due to nephrology recommendation. Will recheck bmp in one week and will monitor his status.   2. Hypertension: will stop the lisinopril; will begin Imdur 30 mg daily and will monitor his status will have nursing monitor blood pressure every shift.

## 2014-01-02 ENCOUNTER — Non-Acute Institutional Stay (SKILLED_NURSING_FACILITY): Payer: Medicare HMO | Admitting: Adult Health

## 2014-01-02 ENCOUNTER — Ambulatory Visit: Payer: Self-pay | Admitting: Ophthalmology

## 2014-01-02 DIAGNOSIS — N400 Enlarged prostate without lower urinary tract symptoms: Secondary | ICD-10-CM

## 2014-01-02 DIAGNOSIS — E1149 Type 2 diabetes mellitus with other diabetic neurological complication: Secondary | ICD-10-CM

## 2014-01-02 DIAGNOSIS — I5032 Chronic diastolic (congestive) heart failure: Secondary | ICD-10-CM

## 2014-01-02 DIAGNOSIS — N184 Chronic kidney disease, stage 4 (severe): Secondary | ICD-10-CM

## 2014-01-02 DIAGNOSIS — E559 Vitamin D deficiency, unspecified: Secondary | ICD-10-CM

## 2014-01-02 DIAGNOSIS — I150 Renovascular hypertension: Secondary | ICD-10-CM

## 2014-01-02 DIAGNOSIS — F3289 Other specified depressive episodes: Secondary | ICD-10-CM

## 2014-01-02 DIAGNOSIS — J9610 Chronic respiratory failure, unspecified whether with hypoxia or hypercapnia: Secondary | ICD-10-CM

## 2014-01-02 DIAGNOSIS — K5900 Constipation, unspecified: Secondary | ICD-10-CM

## 2014-01-02 DIAGNOSIS — F411 Generalized anxiety disorder: Secondary | ICD-10-CM

## 2014-01-02 DIAGNOSIS — F32A Depression, unspecified: Secondary | ICD-10-CM

## 2014-01-02 DIAGNOSIS — F329 Major depressive disorder, single episode, unspecified: Secondary | ICD-10-CM

## 2014-01-02 DIAGNOSIS — J449 Chronic obstructive pulmonary disease, unspecified: Secondary | ICD-10-CM

## 2014-01-02 DIAGNOSIS — D638 Anemia in other chronic diseases classified elsewhere: Secondary | ICD-10-CM

## 2014-01-02 DIAGNOSIS — G252 Other specified forms of tremor: Secondary | ICD-10-CM

## 2014-01-02 DIAGNOSIS — K219 Gastro-esophageal reflux disease without esophagitis: Secondary | ICD-10-CM

## 2014-01-02 DIAGNOSIS — G250 Essential tremor: Secondary | ICD-10-CM

## 2014-01-02 DIAGNOSIS — I739 Peripheral vascular disease, unspecified: Secondary | ICD-10-CM

## 2014-01-02 DIAGNOSIS — I509 Heart failure, unspecified: Secondary | ICD-10-CM

## 2014-01-02 DIAGNOSIS — E1142 Type 2 diabetes mellitus with diabetic polyneuropathy: Secondary | ICD-10-CM

## 2014-01-09 ENCOUNTER — Encounter: Payer: Self-pay | Admitting: Adult Health

## 2014-01-09 DIAGNOSIS — E559 Vitamin D deficiency, unspecified: Secondary | ICD-10-CM | POA: Insufficient documentation

## 2014-01-09 MED ORDER — VITAMIN D3 1.25 MG (50000 UT) PO CAPS: 50000.0000 [IU] | ORAL_CAPSULE | ORAL | Status: DC

## 2014-01-09 NOTE — Progress Notes (Signed)
Patient ID: Bill MeekerJesse C [name], male   DOB: 16-Jul-1946, 68 y.o.   MRN: [id number]     [facility]  Allergies  Allergen Reactions  . Penicillins Other (See Comments)    unknown     Chief Complaint  Patient presents with  . Medical Management of Chronic Issues    HPI:  He is being seen for the management of his chronic illnesses. He has seen nephrology who has recommended that he not be placed on ace inhibitors due to his renal function. His vit d level is low and will require treatment. His weight is lower but stable. There are no concerns being voiced by the nursing staff at this time.    Past Medical History  Diagnosis Date  . COPD (chronic obstructive pulmonary disease)   . Diabetes mellitus without complication   . Hyperlipidemia   . Depression   . CHF (congestive heart failure)   . Gout   . OSA (obstructive sleep apnea)   . Pneumonia 07/26/2013  . CHF with unknown LVEF 08/11/2013  . Chronic respiratory failure 06/19/2013  . GERD (gastroesophageal reflux disease) 06/19/2013  . DM type 2 with diabetic peripheral neuropathy 06/19/2013  . BPH (benign prostatic hyperplasia) 06/19/2013  . AKI (acute kidney injury) 07/27/2013  . Urinary retention 08/11/2013  . Sepsis 07/27/2013  . Physical deconditioning 06/19/2013  . Perineal abscess 08/01/2013  . Depression, controlled 06/19/2013  . Anemia of chronic disease 07/31/2013    No past surgical history on file.  VITAL SIGNS BP 100/56  Pulse 70  Ht 5\' 11"  (1.803 m)  Wt 292 lb (132.45 kg)  BMI 40.74 kg/m2   Patient's Medications  New Prescriptions   No medications on file  Previous Medications   ACETAMINOPHEN (TYLENOL) 325 MG TABLET    Take 650 mg by mouth every 4 (four) hours as needed for mild pain.   ALBUTEROL (PROVENTIL HFA;VENTOLIN HFA) 108 (90 BASE) MCG/ACT INHALER    Inhale 2 puffs into the lungs every 6 (six) hours as needed for wheezing or shortness of breath.    ALLOPURINOL (ZYLOPRIM) 300 MG TABLET    Take 1  tablet (300 mg total) by mouth daily.   BUDESONIDE-FORMOTEROL (SYMBICORT) 160-4.5 MCG/ACT INHALER    Inhale 2 puffs into the lungs 2 (two) times daily.   CITALOPRAM (CELEXA) 40 MG TABLET    Take 40 mg by mouth daily.   CLONAZEPAM (KLONOPIN) 2 MG TABLET    Take 1 mg by mouth 2 (two) times daily as needed. Take three tablets by mouth every evening as needed for severe anxiety   CRANBERRY 250 MG CAPS    Take 1 capsule by mouth 2 (two) times daily.   FERROUS SULFATE 325 (65 FE) MG TABLET    Take 325 mg by mouth 3 (three) times daily with meals.   FLUTICASONE (FLONASE) 50 MCG/ACT NASAL SPRAY    Place 2 sprays into both nostrils daily.   GABAPENTIN (NEURONTIN) 100 MG CAPSULE    Take 100 mg by mouth 2 (two) times daily.   HYDROCODONE-ACETAMINOPHEN (NORCO/VICODIN) 5-325 MG PER TABLET    Take 1 tablet by mouth every 4 (four) hours as needed for moderate pain.   HYDROXYZINE (ATARAX/VISTARIL) 25 MG TABLET    Take 1 tablet (25 mg total) by mouth every 8 (eight) hours as needed.   INSULIN ASPART (NOVOLOG) 100 UNIT/ML INJECTION    Inject 5 Units into the skin 3 (three) times daily before meals. For cbg >=150   INSULIN DETEMIR (  LEVEMIR) 100 UNIT/ML INJECTION    Inject 0.4 mLs (40 Units total) into the skin daily.   ISOSORBIDE MONONITRATE (IMDUR) 30 MG 24 HR TABLET    Take 1 tablet (30 mg total) by mouth daily.   LINAGLIPTIN (TRADJENTA) 5 MG TABS TABLET    Take 1 tablet (5 mg total) by mouth daily.   OLANZAPINE (ZYPREXA) 10 MG TABLET    Take 7.5 mg by mouth daily.    PANTOPRAZOLE (PROTONIX) 40 MG TABLET    Take 1 tablet (40 mg total) by mouth daily.   POLYETHYLENE GLYCOL (MIRALAX / GLYCOLAX) PACKET    Take 17 g by mouth daily.   PREDNISONE (DELTASONE) 10 MG TABLET    Take 0.5 tablets (5 mg total) by mouth daily with breakfast.   PRIMIDONE (MYSOLINE) 250 MG TABLET    Take 250 mg by mouth 2 (two) times daily.   SENNOSIDES-DOCUSATE SODIUM (SENOKOT-S) 8.6-50 MG TABLET    Take 1 tablet by mouth 2 (two) times daily.    SPIRONOLACTONE (ALDACTONE) 25 MG TABLET    Take 2 tablets (50 mg total) by mouth 2 (two) times daily.   TAMSULOSIN (FLOMAX) 0.4 MG CAPS CAPSULE    Take 0.4 mg by mouth daily.   TIOTROPIUM (SPIRIVA) 18 MCG INHALATION CAPSULE    Place 18 mcg into inhaler and inhale daily.   TORSEMIDE (DEMADEX) 20 MG TABLET    Take 3 tablets (60 mg total) by mouth 2 (two) times daily.   TRAMADOL (ULTRAM) 50 MG TABLET    Take 1 tablet by mouth every 12 hours as needed for pain.   ZOLPIDEM (AMBIEN) 10 MG TABLET    Take 10 mg by mouth at bedtime as needed for sleep.  Modified Medications   No medications on file  Discontinued Medications   ALBUTEROL (PROVENTIL) (2.5 MG/3ML) 0.083% NEBULIZER SOLUTION    Take 2.5 mg by nebulization 2 (two) times daily as needed for wheezing or shortness of breath.    INSULIN LISPRO (HUMALOG) 100 UNIT/ML INJECTION    Inject 2-10 Units into the skin 4 (four) times daily -  before meals and at bedtime. Sliding scale    SIGNIFICANT DIAGNOSTIC EXAMS   07-26-13: chest x-ray: minimal bibasilar pneumonitis with superimposed mild congestive changes.   08-04-13: kub: bowel gas pattern could be secondary to partial bowel obstruction versus moderate to severe ileus. No abnormal calcifications in the region of the abdomen   08-10-13: ct of pelvis: 4.1 x 2.4 cm left perineal/scrotal abscess. Associated foci of soft tissue gas, correlate for recent intervention.  Additional 4.3 x 2.3 cm left gluteal fluid collection, suspicious  for early/developing abscess.  Associated mild subcutaneous stranding in the left gluteal region,  likely reflecting cellulitis.  Mildly thick-walled bladder, correlate for cystitis.  11-09-13: chest x-ray: consistent with chronic chf; findings appear similar to previous studies. There is a question of slight progression of chf.   11-24-13: TEE: Left ventricle: The cavity size was mildly dilated. Systolic function was normal. The estimated ejection fraction was in the range  of 55% to 60%. Wall motion was normal; there were no regional wall motion abnormalities.Doppler parameters are consistent with abnormal left ventricular relaxation (grade 1 diastolic dysfunction).  12-08-13: chest x-ray: minimal cardiomegaly without change with minimal pulmonary vascular congestion without change. No pleural effusion. Patchy bilateral atlectasis  or pneumonia appears new.    12-12-13: renal ultrasound: 1. No renal hydronephrosis, renal calculus or renal mass. 2. Increased renal cortical echogenicity involving both kidneys diffusely consistent  with medical renal disease. 3. Pre-void urinary bladder volume 59 ml; no urinary bladder mass.    12-20-13: ekg: sinus tachycardia with pvc's     LAS REVIEWED:   07-10-13: glucose 207; bun 18; creat 1.0; k+ 3.8; na++ 137  07-24-13: glucose 296; bun 24; creat 1.4; k+3.6; na++ 134 07-26-13: wbc 15.5; hgb 9.8; hct 34.1; mcv 85; plt 254; glucose 229; bun 25; creat 1.2; k+3.7;na++ 133 07-31-13: wbc 13.0; hgb 9.2; hct 32.8; mcv 85; plt 267; glucose 136;bun 26; creat 1.0; k+3.9; na++ 137; liver normal albumin 2.7 hgb a1c 7.7  08-02-13: glucose 97; bun 30;creat 1.2; k+4.0; na++ 140;  08-03-13: wbc 26.2; hgb 10.4; hct 35.9; mcv 85.1 plt 289; glucose 158; bun 29;creat 1.2;k+3.9; na++138  08-09-13: wbc 18.3; hgb 10.3; hct 37.6; mcv 86.2 plt 318  08-21-13: wbc 8.8; hgb 10.8; hct 38.3; mcv 85.1;plt 316; glucose 106; bun 13; creat 0.9; k+3.9; na++138 10-23-13: BNP: 17 10-27-13: glucose 140; bun 31; creat 1.1; k+4.2; na++140  11-06-13: glucose 98; bun 83; creat 1.9; k+4.7; na++133 11-10-13: glucose 141; bun 79; creat 1.8; k+4.8; na++135; BNP 15 11-13-13: liver normal albumin 4.0; pre-albumin 40.19   11-17-13: glucose 84; bun 87; creat 1.6; k+4.4; na++141  12-06-13: glucose 125; bun 73; creat 2.3; k+4.3; na++135  12-11-13: wbc 10.5; hgb 10.6; hct 35.7; mcv 97.5 plt 236; glucose 95; bun 76; creat 1.7; k+4.7; na++138; liver normal albumin 3.9; mag 1.9; phos 4.6    12-25-13: glucose 113; bun 60; creat 1.3; k+4.4; na++139  12-27-13: wbc 11.2; hgb 0.9; hct 32.8; mcv 90; plt 211;  glucose 115; bun 48; creat 1.29; k+4.3; na++139; liver normal albumin 4.3; iron 92; tibc 324   phos 3.4; pth 97; vit d 12.8; hgb a1c 5.9     .Review of Systems  Constitutional: Negative for malaise/fatigue.  HENT: negative for congestion      Respiratory: negative for cough .no   shortness of breath and wheezing Cardiovascular: Negative for chest pain, palpitations HAS EDEMA is improving   Gastrointestinal: Negative for heartburn, abdominal pain and constipation.  Musculoskeletal: negative for pint pain . Negative for myalgias.  Skin: negative   Neurological: Negative for dizziness, weakness and headaches.  Psychiatric/Behavioral: Negative for depression. The patient is not nervous/anxious and does not have insomnia.         Physical Exam  Constitutional: He is oriented to person, place, and time. He appears well-developed and well-nourished. No distress.  obese  Neck: Neck supple. No JVD present. No thyromegaly present.  Cardiovascular: Normal rate, regular rhythm and intact distal pulses.   Respiratory: Effort normal and breath sounds normal. No respiratory distress.no wheezes   GI: Soft. Bowel sounds are normal. He exhibits no distension. There is no tenderness.  Musculoskeletal: Normal range of motion. Has 2+  edema present .   Neurological: He is alert and oriented to person, place, and time.  Skin: Skin is warm. He is not diaphoretic.  Psychiatric: He has a normal mood and affect.      ASSESSMENT/ PLAN:  1. COPD: is stable will continue 02 prn; spiriva daily; symbicort 160/4.5 2 puffs twice daily;flonase daily prednisone 5 mg daily  albuterol 2 puffs every 4 hours as needed and will monitor  2. Vitamin D deficiency: will begin vit d 50,000 units weekly   3. Diabetes: is stable will continue tradjenta 5 mg daily novolog 5 units prior to meals for cbg  >=150 and levemir 40 units nightly and will monitor   4. CHF: is  stable will continue 1800 cc fluid restriction; will continue demadex 60 mg twice daily; aldactone 50 mg twice daily  and daily weights; his weight has been below 300 pounds.   5. Hypertension: he is stable will continue aldactone 50 mg twice daily; and imdur 30 mg daily   6. Anemia: will continue iron three times daily and will monitor   7. Gout: no recent flares will continue allopurinol 300 mg daily and will monitor   8. Essential tremor: is stable will continue primidone 250 mg twice daily   9. Gerd: will continue protonix 40 mg daily  10. Constipation: will continue miralax daily; senna s twice daily   11. Anxiety with depression; is followed by [facility]; will continue celexa 40 mg daily; zyprexa 7.5 mg nightly and has klonopin 1 mg twice daly as needed for anxiety will monitor  12. BPH: will continue flomax daily   13. PVD: will continue neurontin 100 mg twice daily for pain and vicodin 5/325 mg every 4 hours as needed for pain.

## 2014-01-10 ENCOUNTER — Other Ambulatory Visit: Payer: Self-pay

## 2014-01-10 ENCOUNTER — Non-Acute Institutional Stay (SKILLED_NURSING_FACILITY): Payer: Medicare HMO | Admitting: Adult Health

## 2014-01-10 ENCOUNTER — Encounter: Payer: Self-pay | Admitting: Adult Health

## 2014-01-10 DIAGNOSIS — I5032 Chronic diastolic (congestive) heart failure: Secondary | ICD-10-CM

## 2014-01-10 DIAGNOSIS — I509 Heart failure, unspecified: Secondary | ICD-10-CM

## 2014-01-10 LAB — PRO B NATRIURETIC PEPTIDE: B-TYPE NATIURETIC PEPTID: 282 pg/mL — AB (ref 0–125)

## 2014-01-10 LAB — CBC WITH DIFFERENTIAL/PLATELET
BASOS PCT: 1.2 %
Basophil #: 0.1 10*3/uL (ref 0.0–0.1)
EOS PCT: 1.2 %
Eosinophil #: 0.2 10*3/uL (ref 0.0–0.7)
HCT: 33.7 % — AB (ref 40.0–52.0)
HGB: 10.6 g/dL — ABNORMAL LOW (ref 13.0–18.0)
LYMPHS ABS: 1 10*3/uL (ref 1.0–3.6)
LYMPHS PCT: 8.2 %
MCH: 29.9 pg (ref 26.0–34.0)
MCHC: 31.3 g/dL — ABNORMAL LOW (ref 32.0–36.0)
MCV: 96 fL (ref 80–100)
Monocyte #: 0.6 x10 3/mm (ref 0.2–1.0)
Monocyte %: 4.5 %
Neutrophil #: 10.4 10*3/uL — ABNORMAL HIGH (ref 1.4–6.5)
Neutrophil %: 84.9 %
PLATELETS: 223 10*3/uL (ref 150–440)
RBC: 3.53 10*6/uL — ABNORMAL LOW (ref 4.40–5.90)
RDW: 16.9 % — ABNORMAL HIGH (ref 11.5–14.5)
WBC: 12.3 10*3/uL — ABNORMAL HIGH (ref 3.8–10.6)

## 2014-01-10 LAB — BASIC METABOLIC PANEL
Anion Gap: 5 — ABNORMAL LOW (ref 7–16)
BUN: 33 mg/dL — AB (ref 7–18)
CREATININE: 1.32 mg/dL — AB (ref 0.60–1.30)
Calcium, Total: 8.7 mg/dL (ref 8.5–10.1)
Chloride: 98 mmol/L (ref 98–107)
Co2: 36 mmol/L — ABNORMAL HIGH (ref 21–32)
EGFR (African American): >60
GFR CALC NON AF AMER: 56 — AB
Glucose: 168 mg/dL — ABNORMAL HIGH (ref 65–99)
Osmolality: 289 (ref 275–301)
Potassium: 4.6 mmol/L (ref 3.5–5.1)
Sodium: 139 mmol/L (ref 136–145)

## 2014-01-10 NOTE — Progress Notes (Signed)
Patient ID: Bill [name], male   DOB: [birth year], 68 y.o.   MRN: [id number]     [facility]  Allergies  Allergen Reactions  . Penicillins Other (See Comments)    unknown     Chief Complaint  Patient presents with  . Acute Visit    change in status     HPI:  He has gained weight from 297 pound on 01-04-14 to 301.4 pounds today. He states he has gotten weaker since yesterday. Nursing staff reports that he has not been adhering to his fluid restriction and has been high salt foods recently. He has been turning his 02 up to 5 liters at times. I have instructed him on the importance of adhering to the fluid and salt restrictions. I have also instructed him not to adjust his 02 dose as this medication can affect his lung function; he did verbalize understanding   Past Medical History  Diagnosis Date  . COPD (chronic obstructive pulmonary disease)   . Diabetes mellitus without complication   . Hyperlipidemia   . Depression   . CHF (congestive heart failure)   . Gout   . OSA (obstructive sleep apnea)   . Pneumonia 07/26/2013  . CHF with unknown LVEF 08/11/2013  . Chronic respiratory failure 06/19/2013  . GERD (gastroesophageal reflux disease) 06/19/2013  . DM type 2 with diabetic peripheral neuropathy 06/19/2013  . BPH (benign prostatic hyperplasia) 06/19/2013  . AKI (acute kidney injury) 07/27/2013  . Urinary retention 08/11/2013  . Sepsis 07/27/2013  . Physical deconditioning 06/19/2013  . Perineal abscess 08/01/2013  . Depression, controlled 06/19/2013  . Anemia of chronic disease 07/31/2013    Past Surgical History  Procedure Laterality Date  . Left eye cataract removal      VITAL SIGNS BP 112/57  Pulse 85  Ht 5\' 11"  (1.803 m)  Wt 301 lb 6.4 oz (136.714 kg)  BMI 42.06 kg/m2   Patient's Medications  New Prescriptions   No medications on file  Previous Medications   ACETAMINOPHEN (TYLENOL) 325 MG TABLET    Take 650 mg by mouth every 4 (four) hours as needed for  mild pain.   ALBUTEROL (PROVENTIL HFA;VENTOLIN HFA) 108 (90 BASE) MCG/ACT INHALER    Inhale 2 puffs into the lungs every 6 (six) hours as needed for wheezing or shortness of breath.    ALLOPURINOL (ZYLOPRIM) 300 MG TABLET    Take 1 tablet (300 mg total) by mouth daily.   BUDESONIDE-FORMOTEROL (SYMBICORT) 160-4.5 MCG/ACT INHALER    Inhale 2 puffs into the lungs 2 (two) times daily.   CHOLECALCIFEROL (VITAMIN D3) 50000 UNITS CAPS    Take 50,000 Units by mouth every 7 (seven) days.   CITALOPRAM (CELEXA) 40 MG TABLET    Take 40 mg by mouth daily.   CLONAZEPAM (KLONOPIN) 2 MG TABLET    Take 1 mg by mouth 2 (two) times daily as needed. Take three tablets by mouth every evening as needed for severe anxiety   CRANBERRY 250 MG CAPS    Take 1 capsule by mouth 2 (two) times daily.   FERROUS SULFATE 325 (65 FE) MG TABLET    Take 325 mg by mouth 3 (three) times daily with meals.   FLUTICASONE (FLONASE) 50 MCG/ACT NASAL SPRAY    Place 2 sprays into both nostrils daily.   GABAPENTIN (NEURONTIN) 100 MG CAPSULE    Take 100 mg by mouth 2 (two) times daily.   HYDROCODONE-ACETAMINOPHEN (NORCO/VICODIN) 5-325 MG PER TABLET    Take  1 tablet by mouth every 4 (four) hours as needed for moderate pain.   HYDROXYZINE (ATARAX/VISTARIL) 25 MG TABLET    Take 1 tablet (25 mg total) by mouth every 8 (eight) hours as needed.   INSULIN ASPART (NOVOLOG) 100 UNIT/ML INJECTION    Inject 5 Units into the skin 3 (three) times daily before meals. For cbg >=150   INSULIN DETEMIR (LEVEMIR) 100 UNIT/ML INJECTION    Inject 0.4 mLs (40 Units total) into the skin daily.   ISOSORBIDE MONONITRATE (IMDUR) 30 MG 24 HR TABLET    Take 1 tablet (30 mg total) by mouth daily.   LINAGLIPTIN (TRADJENTA) 5 MG TABS TABLET    Take 1 tablet (5 mg total) by mouth daily.   OLANZAPINE (ZYPREXA) 10 MG TABLET    Take 10 mg by mouth daily.    PANTOPRAZOLE (PROTONIX) 40 MG TABLET    Take 1 tablet (40 mg total) by mouth daily.   POLYETHYLENE GLYCOL (MIRALAX /  GLYCOLAX) PACKET    Take 17 g by mouth daily.   PREDNISONE (DELTASONE) 10 MG TABLET    Take 0.5 tablets (5 mg total) by mouth daily with breakfast.   PRIMIDONE (MYSOLINE) 250 MG TABLET    Take 250 mg by mouth 2 (two) times daily.   SENNOSIDES-DOCUSATE SODIUM (SENOKOT-S) 8.6-50 MG TABLET    Take 1 tablet by mouth 2 (two) times daily.   SPIRONOLACTONE (ALDACTONE) 25 MG TABLET    Take 2 tablets (50 mg total) by mouth 2 (two) times daily.   TAMSULOSIN (FLOMAX) 0.4 MG CAPS CAPSULE    Take 0.4 mg by mouth daily.   TIOTROPIUM (SPIRIVA) 18 MCG INHALATION CAPSULE    Place 18 mcg into inhaler and inhale daily.   TORSEMIDE (DEMADEX) 20 MG TABLET    Take 3 tablets (60 mg total) by mouth 2 (two) times daily.   TRAMADOL (ULTRAM) 50 MG TABLET    Take 1 tablet by mouth every 12 hours as needed for pain.   ZOLPIDEM (AMBIEN) 10 MG TABLET    Take 10 mg by mouth at bedtime as needed for sleep.  Modified Medications   No medications on file  Discontinued Medications   No medications on file    SIGNIFICANT DIAGNOSTIC EXAMS  07-26-13: chest x-ray: minimal bibasilar pneumonitis with superimposed mild congestive changes.   08-04-13: kub: bowel gas pattern could be secondary to partial bowel obstruction versus moderate to severe ileus. No abnormal calcifications in the region of the abdomen   08-10-13: ct of pelvis: 4.1 x 2.4 cm left perineal/scrotal abscess. Associated foci of soft tissue gas, correlate for recent intervention.  Additional 4.3 x 2.3 cm left gluteal fluid collection, suspicious  for early/developing abscess.  Associated mild subcutaneous stranding in the left gluteal region,  likely reflecting cellulitis.  Mildly thick-walled bladder, correlate for cystitis.  11-09-13: chest x-ray: consistent with chronic chf; findings appear similar to previous studies. There is a question of slight progression of chf.   11-24-13: TEE: Left ventricle: The cavity size was mildly dilated. Systolic function was  normal. The estimated ejection fraction was in the range of 55% to 60%. Wall motion was normal; there were no regional wall motion abnormalities.Doppler parameters are consistent with abnormal left ventricular relaxation (grade 1 diastolic dysfunction).  12-08-13: chest x-ray: minimal cardiomegaly without change with minimal pulmonary vascular congestion without change. No pleural effusion. Patchy bilateral atlectasis  or pneumonia appears new.    12-12-13: renal ultrasound: 1. No renal hydronephrosis, renal calculus or  renal mass. 2. Increased renal cortical echogenicity involving both kidneys diffusely consistent with medical renal disease. 3. Pre-void urinary bladder volume 59 ml; no urinary bladder mass.    12-20-13: ekg: sinus tachycardia with pvc's      LAS REVIEWED:   07-10-13: glucose 207; bun 18; creat 1.0; k+ 3.8; na++ 137  07-24-13: glucose 296; bun 24; creat 1.4; k+3.6; na++ 134 07-26-13: wbc 15.5; hgb 9.8; hct 34.1; mcv 85; plt 254; glucose 229; bun 25; creat 1.2; k+3.7;na++ 133 07-31-13: wbc 13.0; hgb 9.2; hct 32.8; mcv 85; plt 267; glucose 136;bun 26; creat 1.0; k+3.9; na++ 137; liver normal albumin 2.7 hgb a1c 7.7  08-02-13: glucose 97; bun 30;creat 1.2; k+4.0; na++ 140;  08-03-13: wbc 26.2; hgb 10.4; hct 35.9; mcv 85.1 plt 289; glucose 158; bun 29;creat 1.2;k+3.9; na++138  08-09-13: wbc 18.3; hgb 10.3; hct 37.6; mcv 86.2 plt 318  08-21-13: wbc 8.8; hgb 10.8; hct 38.3; mcv 85.1;plt 316; glucose 106; bun 13; creat 0.9; k+3.9; na++138 10-23-13: BNP: 17 10-27-13: glucose 140; bun 31; creat 1.1; k+4.2; na++140  11-06-13: glucose 98; bun 83; creat 1.9; k+4.7; na++133 11-10-13: glucose 141; bun 79; creat 1.8; k+4.8; na++135; BNP 15 11-13-13: liver normal albumin 4.0; pre-albumin 40.19   11-17-13: glucose 84; bun 87; creat 1.6; k+4.4; na++141  12-06-13: glucose 125; bun 73; creat 2.3; k+4.3; na++135  12-11-13: wbc 10.5; hgb 10.6; hct 35.7; mcv 97.5 plt 236; glucose 95; bun 76; creat 1.7; k+4.7;  na++138; liver normal albumin 3.9; mag 1.9; phos 4.6  12-25-13: glucose 113; bun 60; creat 1.3; k+4.4; na++139  12-27-13: wbc 11.2; hgb 0.9; hct 32.8; mcv 90; plt 211;  glucose 115; bun 48; creat 1.29; k+4.3; na++139; liver normal albumin 4.3; iron 92; tibc 324   phos 3.4; pth 97; vit d 12.8; hgb a1c 5.9     .Review of Systems  Constitutional: has weakness and fatigue  HENT: negative for congestion      Respiratory: negative for cough .no   shortness of breath and wheezing Cardiovascular: Negative for chest pain, palpitations edema  Gastrointestinal: Negative for heartburn, abdominal pain and constipation.  Musculoskeletal: negative for pint pain . Negative for myalgias.  Skin: negative   Neurological: Negative for dizziness, weakness and headaches.  Psychiatric/Behavioral: Negative for depression. The patient is not nervous/anxious and does not have insomnia.         Physical Exam  Constitutional: He is oriented to person, place, and time. He appears well-developed and well-nourished. No distress.  obese  Neck: Neck supple. No JVD present. No thyromegaly present.  Cardiovascular: Normal rate, regular rhythm and intact distal pulses.   Respiratory: Effort normal breath sounds diminished. No respiratory distress.no wheezes   GI: Soft. Bowel sounds are normal. He exhibits no distension. There is no tenderness.  Musculoskeletal: Normal range of motion. Has 2+  edema present .   Neurological: He is alert and oriented to person, place, and time.  Skin: Skin is warm. He is not diaphoretic.  Psychiatric: He has a normal mood and affect.     ASSESSMENT/ PLAN:  1. CHF: is worse; will check cbc; bmp; bnp; chest x-ray; will give an extra demadex 60 mg today and will monitor his status.    Time spent with patient 45 minutes.      Synthia Innocenteborah Kainan Patty NP [facility]  Contact [phone] Monday through Friday 8am- 5pm  After hours call [phone]

## 2014-01-12 ENCOUNTER — Non-Acute Institutional Stay (SKILLED_NURSING_FACILITY): Payer: Medicare HMO | Admitting: Adult Health

## 2014-01-12 DIAGNOSIS — I509 Heart failure, unspecified: Secondary | ICD-10-CM

## 2014-01-12 DIAGNOSIS — I5032 Chronic diastolic (congestive) heart failure: Secondary | ICD-10-CM

## 2014-01-12 DIAGNOSIS — N184 Chronic kidney disease, stage 4 (severe): Secondary | ICD-10-CM

## 2014-01-12 LAB — CBC AND DIFFERENTIAL
HCT: 34 % — AB (ref 41–53)
Hemoglobin: 10.6 g/dL — AB (ref 13.5–17.5)
Platelets: 223 10*3/uL (ref 150–399)
WBC: 12.3 10^3/mL

## 2014-01-12 LAB — BASIC METABOLIC PANEL
BUN: 33 mg/dL — AB (ref 4–21)
CREATININE: 1.3 mg/dL (ref 0.6–1.3)
GLUCOSE: 168 mg/dL
Potassium: 4.6 mmol/L (ref 3.4–5.3)
Sodium: 139 mmol/L (ref 137–147)

## 2014-01-16 LAB — CBC AND DIFFERENTIAL
HCT: 39 % — AB (ref 41–53)
Hemoglobin: 11 g/dL — AB (ref 13.5–17.5)
Platelets: 217 10*3/uL (ref 150–399)
WBC: 9.1 10^3/mL

## 2014-01-16 LAB — BASIC METABOLIC PANEL
BUN: 28 mg/dL — AB (ref 4–21)
CREATININE: 1 mg/dL (ref 0.6–1.3)
GLUCOSE: 122 mg/dL
POTASSIUM: 3.9 mmol/L (ref 3.4–5.3)
SODIUM: 141 mmol/L (ref 137–147)

## 2014-01-21 ENCOUNTER — Encounter: Payer: Self-pay | Admitting: Adult Health

## 2014-01-21 NOTE — Progress Notes (Signed)
Patient ID: Bill [name], male   DOB: July 05, 1946, 68 y.o.   MRN: [id number]     [facility]  Allergies  Allergen Reactions  . Penicillins Other (See Comments)    unknown     Chief Complaint  Patient presents with  . Acute Visit    weight gain     HPI:  He has continued to gain weight over the past several days. His weight 01-11-14 was 297.8 pounds and today is 302.8 pounds. He has become non-adherent to his diet and fluid restriction over the past week. He is more weak and fatigued.   Past Medical History  Diagnosis Date  . COPD (chronic obstructive pulmonary disease)   . Diabetes mellitus without complication   . Hyperlipidemia   . Depression   . CHF (congestive heart failure)   . Gout   . OSA (obstructive sleep apnea)   . Pneumonia 07/26/2013  . CHF with unknown LVEF 08/11/2013  . Chronic respiratory failure 06/19/2013  . GERD (gastroesophageal reflux disease) 06/19/2013  . DM type 2 with diabetic peripheral neuropathy 06/19/2013  . BPH (benign prostatic hyperplasia) 06/19/2013  . AKI (acute kidney injury) 07/27/2013  . Urinary retention 08/11/2013  . Sepsis 07/27/2013  . Physical deconditioning 06/19/2013  . Perineal abscess 08/01/2013  . Depression, controlled 06/19/2013  . Anemia of chronic disease 07/31/2013    Past Surgical History  Procedure Laterality Date  . Left eye cataract removal      VITAL SIGNS BP 114/63  Pulse 77  Ht 5\' 11"  (1.803 m)  Wt 302 lb 12.8 oz (137.349 kg)  BMI 42.25 kg/m2   Patient's Medications  New Prescriptions   No medications on file  Previous Medications   ACETAMINOPHEN (TYLENOL) 325 MG TABLET    Take 650 mg by mouth every 4 (four) hours as needed for mild pain.   ALBUTEROL (PROVENTIL HFA;VENTOLIN HFA) 108 (90 BASE) MCG/ACT INHALER    Inhale 2 puffs into the lungs every 6 (six) hours as needed for wheezing or shortness of breath.    ALLOPURINOL (ZYLOPRIM) 300 MG TABLET    Take 1 tablet (300 mg total) by mouth daily.   BUDESONIDE-FORMOTEROL (SYMBICORT) 160-4.5 MCG/ACT INHALER    Inhale 2 puffs into the lungs 2 (two) times daily.   CHOLECALCIFEROL (VITAMIN D3) 50000 UNITS CAPS    Take 50,000 Units by mouth every 7 (seven) days.   CITALOPRAM (CELEXA) 40 MG TABLET    Take 40 mg by mouth daily.   CLONAZEPAM (KLONOPIN) 2 MG TABLET    Take 1 mg by mouth 2 (two) times daily as needed. Take three tablets by mouth every evening as needed for severe anxiety   CRANBERRY 250 MG CAPS    Take 1 capsule by mouth 2 (two) times daily.   FERROUS SULFATE 325 (65 FE) MG TABLET    Take 325 mg by mouth 3 (three) times daily with meals.   FLUTICASONE (FLONASE) 50 MCG/ACT NASAL SPRAY    Place 2 sprays into both nostrils daily.   GABAPENTIN (NEURONTIN) 100 MG CAPSULE    Take 100 mg by mouth 2 (two) times daily.   HYDROCODONE-ACETAMINOPHEN (NORCO/VICODIN) 5-325 MG PER TABLET    Take 1 tablet by mouth every 4 (four) hours as needed for moderate pain.   HYDROXYZINE (ATARAX/VISTARIL) 25 MG TABLET    Take 1 tablet (25 mg total) by mouth every 8 (eight) hours as needed.   INSULIN ASPART (NOVOLOG) 100 UNIT/ML INJECTION    Inject 5 Units  into the skin 3 (three) times daily before meals. For cbg >=150   INSULIN DETEMIR (LEVEMIR) 100 UNIT/ML INJECTION    Inject 0.4 mLs (40 Units total) into the skin daily.   ISOSORBIDE MONONITRATE (IMDUR) 30 MG 24 HR TABLET    Take 1 tablet (30 mg total) by mouth daily.   LINAGLIPTIN (TRADJENTA) 5 MG TABS TABLET    Take 1 tablet (5 mg total) by mouth daily.   OLANZAPINE (ZYPREXA) 10 MG TABLET    Take 10 mg by mouth daily.    PANTOPRAZOLE (PROTONIX) 40 MG TABLET    Take 1 tablet (40 mg total) by mouth daily.   POLYETHYLENE GLYCOL (MIRALAX / GLYCOLAX) PACKET    Take 17 g by mouth daily.   PREDNISONE (DELTASONE) 10 MG TABLET    Take 0.5 tablets (5 mg total) by mouth daily with breakfast.   PRIMIDONE (MYSOLINE) 250 MG TABLET    Take 250 mg by mouth 2 (two) times daily.   SENNOSIDES-DOCUSATE SODIUM (SENOKOT-S) 8.6-50  MG TABLET    Take 1 tablet by mouth 2 (two) times daily.   SPIRONOLACTONE (ALDACTONE) 25 MG TABLET    Take 2 tablets (50 mg total) by mouth 2 (two) times daily.   TAMSULOSIN (FLOMAX) 0.4 MG CAPS CAPSULE    Take 0.4 mg by mouth daily.   TIOTROPIUM (SPIRIVA) 18 MCG INHALATION CAPSULE    Place 18 mcg into inhaler and inhale daily.   TORSEMIDE (DEMADEX) 20 MG TABLET    Take 3 tablets (60 mg total) by mouth 2 (two) times daily.   TRAMADOL (ULTRAM) 50 MG TABLET    Take 1 tablet by mouth every 12 hours as needed for pain.   ZOLPIDEM (AMBIEN) 10 MG TABLET    Take 10 mg by mouth at bedtime as needed for sleep.  Modified Medications   No medications on file  Discontinued Medications   No medications on file    SIGNIFICANT DIAGNOSTIC EXAMS   07-26-13: chest x-ray: minimal bibasilar pneumonitis with superimposed mild congestive changes.   08-04-13: kub: bowel gas pattern could be secondary to partial bowel obstruction versus moderate to severe ileus. No abnormal calcifications in the region of the abdomen   08-10-13: ct of pelvis: 4.1 x 2.4 cm left perineal/scrotal abscess. Associated foci of soft tissue gas, correlate for recent intervention.  Additional 4.3 x 2.3 cm left gluteal fluid collection, suspicious  for early/developing abscess.  Associated mild subcutaneous stranding in the left gluteal region,  likely reflecting cellulitis.  Mildly thick-walled bladder, correlate for cystitis.  11-09-13: chest x-ray: consistent with chronic chf; findings appear similar to previous studies. There is a question of slight progression of chf.   11-24-13: TEE: Left ventricle: The cavity size was mildly dilated. Systolic function was normal. The estimated ejection fraction was in the range of 55% to 60%. Wall motion was normal; there were no regional wall motion abnormalities.Doppler parameters are consistent with abnormal left ventricular relaxation (grade 1 diastolic dysfunction).  12-08-13: chest x-ray:  minimal cardiomegaly without change with minimal pulmonary vascular congestion without change. No pleural effusion. Patchy bilateral atlectasis  or pneumonia appears new.    12-12-13: renal ultrasound: 1. No renal hydronephrosis, renal calculus or renal mass. 2. Increased renal cortical echogenicity involving both kidneys diffusely consistent with medical renal disease. 3. Pre-void urinary bladder volume 59 ml; no urinary bladder mass.   12-20-13: ekg: sinus tachycardia with pvc's   01-10-14: chest x-ray: 1. Resolution of previously noted pneumonia lower lungs. 2. Mild  cardiomegaly with borderline pulmonary vasculature.      LAS REVIEWED:   07-10-13: glucose 207; bun 18; creat 1.0; k+ 3.8; na++ 137  07-24-13: glucose 296; bun 24; creat 1.4; k+3.6; na++ 134 07-26-13: wbc 15.5; hgb 9.8; hct 34.1; mcv 85; plt 254; glucose 229; bun 25; creat 1.2; k+3.7;na++ 133 07-31-13: wbc 13.0; hgb 9.2; hct 32.8; mcv 85; plt 267; glucose 136;bun 26; creat 1.0; k+3.9; na++ 137; liver normal albumin 2.7 hgb a1c 7.7  08-02-13: glucose 97; bun 30;creat 1.2; k+4.0; na++ 140;  08-03-13: wbc 26.2; hgb 10.4; hct 35.9; mcv 85.1 plt 289; glucose 158; bun 29;creat 1.2;k+3.9; na++138  08-09-13: wbc 18.3; hgb 10.3; hct 37.6; mcv 86.2 plt 318  08-21-13: wbc 8.8; hgb 10.8; hct 38.3; mcv 85.1;plt 316; glucose 106; bun 13; creat 0.9; k+3.9; na++138 10-23-13: BNP: 17 10-27-13: glucose 140; bun 31; creat 1.1; k+4.2; na++140  11-06-13: glucose 98; bun 83; creat 1.9; k+4.7; na++133 11-10-13: glucose 141; bun 79; creat 1.8; k+4.8; na++135; BNP 15 11-13-13: liver normal albumin 4.0; pre-albumin 40.19   11-17-13: glucose 84; bun 87; creat 1.6; k+4.4; na++141  12-06-13: glucose 125; bun 73; creat 2.3; k+4.3; na++135  12-11-13: wbc 10.5; hgb 10.6; hct 35.7; mcv 97.5 plt 236; glucose 95; bun 76; creat 1.7; k+4.7; na++138; liver normal albumin 3.9; mag 1.9; phos 4.6  12-25-13: glucose 113; bun 60; creat 1.3; k+4.4; na++139  12-27-13: wbc 11.2; hgb  0.9; hct 32.8; mcv 90; plt 211;  glucose 115; bun 48; creat 1.29; k+4.3; na++139; liver normal albumin 4.3; iron 92; tibc 324  phos 3.4; pth 97; vit d 12.8; hgb a1c 5.9 01-10-14: wbc 12.3; hgb 10.6; hct 33.7; mcv 96; plt 223; glucose 168; bun 33; creat 1.32; k+4.6; na++139     .Review of Systems  Constitutional: has weakness and fatigue  HENT: negative for congestion      Respiratory: negative for cough .no   shortness of breath and wheezing Cardiovascular: Negative for chest pain, palpitations edema  Gastrointestinal: Negative for heartburn, abdominal pain and constipation.  Musculoskeletal: negative for pint pain . Negative for myalgias.  Skin: negative   Neurological: Negative for dizziness, weakness and headaches.  Psychiatric/Behavioral: Negative for depression. The patient is not nervous/anxious and does not have insomnia.         Physical Exam  Constitutional: He is oriented to person, place, and time. He appears well-developed and well-nourished. No distress.  obese  Neck: Neck supple. No JVD present. No thyromegaly present.  Cardiovascular: Normal rate, regular rhythm and intact distal pulses.   Respiratory: Effort normal breath sounds diminished. No respiratory distress.no wheezes   GI: Soft. Bowel sounds are normal. He exhibits no distension. There is no tenderness.  Musculoskeletal: Normal range of motion. Has 2+-3+  edema present .   Neurological: He is alert and oriented to person, place, and time.  Skin: Skin is warm. He is not diaphoretic.  Psychiatric: He has a normal mood and affect.     ASSESSMENT/ PLAN:  1. CHF: will make his demadex 60 mg three times daily for 3 days; will then repeat chest x-ray and will check cbc and bmp. Will monitor his status   3. CKD: stage IV:  his renal function remains stable will continue to monitor his status.

## 2014-01-24 ENCOUNTER — Non-Acute Institutional Stay (SKILLED_NURSING_FACILITY): Payer: Medicare HMO | Admitting: Adult Health

## 2014-01-24 DIAGNOSIS — I5032 Chronic diastolic (congestive) heart failure: Secondary | ICD-10-CM

## 2014-01-24 DIAGNOSIS — I509 Heart failure, unspecified: Secondary | ICD-10-CM

## 2014-01-29 ENCOUNTER — Encounter: Payer: Self-pay | Admitting: Internal Medicine

## 2014-01-29 ENCOUNTER — Non-Acute Institutional Stay (SKILLED_NURSING_FACILITY): Payer: PRIVATE HEALTH INSURANCE | Admitting: Internal Medicine

## 2014-01-29 DIAGNOSIS — IMO0002 Reserved for concepts with insufficient information to code with codable children: Secondary | ICD-10-CM

## 2014-01-29 DIAGNOSIS — J449 Chronic obstructive pulmonary disease, unspecified: Secondary | ICD-10-CM

## 2014-01-29 DIAGNOSIS — I5032 Chronic diastolic (congestive) heart failure: Secondary | ICD-10-CM

## 2014-01-29 DIAGNOSIS — E1149 Type 2 diabetes mellitus with other diabetic neurological complication: Secondary | ICD-10-CM

## 2014-01-29 DIAGNOSIS — E1142 Type 2 diabetes mellitus with diabetic polyneuropathy: Secondary | ICD-10-CM

## 2014-01-29 DIAGNOSIS — M792 Neuralgia and neuritis, unspecified: Secondary | ICD-10-CM | POA: Insufficient documentation

## 2014-01-29 DIAGNOSIS — F32A Depression, unspecified: Secondary | ICD-10-CM

## 2014-01-29 DIAGNOSIS — D638 Anemia in other chronic diseases classified elsewhere: Secondary | ICD-10-CM

## 2014-01-29 DIAGNOSIS — I509 Heart failure, unspecified: Secondary | ICD-10-CM

## 2014-01-29 DIAGNOSIS — F3289 Other specified depressive episodes: Secondary | ICD-10-CM

## 2014-01-29 DIAGNOSIS — F329 Major depressive disorder, single episode, unspecified: Secondary | ICD-10-CM

## 2014-01-29 NOTE — Progress Notes (Signed)
Patient ID: Bill MeekerJesse C Muratore, male   DOB: Dec 05, 1945, 68 y.o.   MRN: [id number]     [facility]   PCP: Oneal GroutPANDEY, MAHIMA, MD  Code Status: DNR  Allergies  Allergen Reactions  . Penicillins Other (See Comments)    unknown    Chief Complaint: routine visit  HPI:  68 y/o male patient is seen today for routine visit. He has COPD and is on chronic oxygen, chf, HTN, GERD. He is in his bed today in no acute distress and denies any complaints. No concern from staff.  Review of Systems:  Constitutional: Negative for fever, chills, diaphoresis.  HENT: Negative for congestion, hearing loss and sore throat.   Eyes: Negative for eye pain, blurred vision, double vision and discharge.  Respiratory: Negative for cough, sputum production, shortness of breath and wheezing.   Cardiovascular: Negative for chest pain, palpitations, orthopnea. Positive for leg swelling.  Gastrointestinal: Negative for heartburn, nausea, vomiting, abdominal pain, diarrhea and constipation.  Genitourinary: Negative for dysuria  Musculoskeletal: has back and joint pain Skin: Negative for itching and rash. has intermittent fungal groin rash Neurological: Negative for dizziness, tingling, focal weakness and headaches.  Psychiatric/Behavioral: Negative for depression and memory loss.   Past Medical History  Diagnosis Date  . COPD (chronic obstructive pulmonary disease)   . Diabetes mellitus without complication   . Hyperlipidemia   . Depression   . CHF (congestive heart failure)   . Gout   . OSA (obstructive sleep apnea)   . Pneumonia 07/26/2013  . CHF with unknown LVEF 08/11/2013  . Chronic respiratory failure 06/19/2013  . GERD (gastroesophageal reflux disease) 06/19/2013  . DM type 2 with diabetic peripheral neuropathy 06/19/2013  . BPH (benign prostatic hyperplasia) 06/19/2013  . AKI (acute kidney injury) 07/27/2013  . Urinary retention 08/11/2013  . Sepsis 07/27/2013  . Physical  deconditioning 06/19/2013  . Perineal abscess 08/01/2013  . Depression, controlled 06/19/2013  . Anemia of chronic disease 07/31/2013   Past Surgical History  Procedure Laterality Date  . Left eye cataract removal     Social History:   reports that he quit smoking about 7 years ago. He has never used smokeless tobacco. He reports that he does not drink alcohol or use illicit drugs.  Family History  Problem Relation Age of Onset  . Diabetes Mother   . Heart disease Mother     Medications: Patient's Medications  New Prescriptions   No medications on file  Previous Medications   ACETAMINOPHEN (TYLENOL) 325 MG TABLET    Take 650 mg by mouth every 4 (four) hours as needed for mild pain.   ALBUTEROL (PROVENTIL HFA;VENTOLIN HFA) 108 (90 BASE) MCG/ACT INHALER    Inhale 2 puffs into the lungs every 6 (six) hours as needed for wheezing or shortness of breath.    ALLOPURINOL (ZYLOPRIM) 300 MG TABLET    Take 1 tablet (300 mg total) by mouth daily.   BUDESONIDE-FORMOTEROL (SYMBICORT) 160-4.5 MCG/ACT INHALER    Inhale 2 puffs into the lungs 2 (two) times daily.   CHOLECALCIFEROL (VITAMIN D3) 50000 UNITS CAPS    Take 50,000 Units by mouth every 7 (seven) days.   CITALOPRAM (CELEXA) 40 MG TABLET    Take 40 mg by mouth daily.   CLONAZEPAM (KLONOPIN) 2 MG TABLET    Take 1 mg by mouth 2 (two) times daily as needed. Take three tablets by mouth every evening as needed for severe anxiety   CRANBERRY 250 MG CAPS  Take 1 capsule by mouth 2 (two) times daily.   FERROUS SULFATE 325 (65 FE) MG TABLET    Take 325 mg by mouth 3 (three) times daily with meals.   FLUTICASONE (FLONASE) 50 MCG/ACT NASAL SPRAY    Place 2 sprays into both nostrils daily.   GABAPENTIN (NEURONTIN) 100 MG CAPSULE    Take 100 mg by mouth 2 (two) times daily.   HYDROCODONE-ACETAMINOPHEN (NORCO/VICODIN) 5-325 MG PER TABLET    Take 1 tablet by mouth every 4 (four) hours as needed for moderate pain.   HYDROXYZINE (ATARAX/VISTARIL) 25 MG  TABLET    Take 1 tablet (25 mg total) by mouth every 8 (eight) hours as needed.   INSULIN ASPART (NOVOLOG) 100 UNIT/ML INJECTION    Inject 5 Units into the skin 3 (three) times daily before meals. For cbg >=150   INSULIN DETEMIR (LEVEMIR) 100 UNIT/ML INJECTION    Inject 0.4 mLs (40 Units total) into the skin daily.   ISOSORBIDE MONONITRATE (IMDUR) 30 MG 24 HR TABLET    Take 1 tablet (30 mg total) by mouth daily.   LINAGLIPTIN (TRADJENTA) 5 MG TABS TABLET    Take 1 tablet (5 mg total) by mouth daily.   OLANZAPINE (ZYPREXA) 10 MG TABLET    Take 10 mg by mouth daily.    PANTOPRAZOLE (PROTONIX) 40 MG TABLET    Take 1 tablet (40 mg total) by mouth daily.   POLYETHYLENE GLYCOL (MIRALAX / GLYCOLAX) PACKET    Take 17 g by mouth daily.   PREDNISONE (DELTASONE) 10 MG TABLET    Take 0.5 tablets (5 mg total) by mouth daily with breakfast.   PRIMIDONE (MYSOLINE) 250 MG TABLET    Take 250 mg by mouth 2 (two) times daily.   SENNOSIDES-DOCUSATE SODIUM (SENOKOT-S) 8.6-50 MG TABLET    Take 1 tablet by mouth 2 (two) times daily.   SPIRONOLACTONE (ALDACTONE) 25 MG TABLET    Take 2 tablets (50 mg total) by mouth 2 (two) times daily.   TAMSULOSIN (FLOMAX) 0.4 MG CAPS CAPSULE    Take 0.4 mg by mouth daily.   TIOTROPIUM (SPIRIVA) 18 MCG INHALATION CAPSULE    Place 18 mcg into inhaler and inhale daily.   TORSEMIDE (DEMADEX) 20 MG TABLET    Take 3 tablets (60 mg total) by mouth 2 (two) times daily.   TRAMADOL (ULTRAM) 50 MG TABLET    Take 1 tablet by mouth every 12 hours as needed for pain.   ZOLPIDEM (AMBIEN) 10 MG TABLET    Take 10 mg by mouth at bedtime as needed for sleep.  Modified Medications   No medications on file  Discontinued Medications   No medications on file     Physical Exam: Filed Vitals:   01/29/14 1605  BP: 140/87  Pulse: 89  Temp: 97.9 F (36.6 C)  Resp: 18  SpO2: 95%    General- elderly male in no acute distress, obese Head- atraumatic, normocephalic Eyes- PERRLA, EOMI, no pallor, no  icterus, no discharge Neck- no lymphadenopathy Throat- moist mucus membrane Cardiovascular- normal s1,s2, no murmurs/ rubs/ gallops Respiratory- decreased air entry on both sides, normal effort,  no wheeze, no rhonchi, no crackles, no use of accessory muscles Abdomen- bowel sounds present, soft, non tender Musculoskeletal- able to move all 4 extremities, 2+ edema Neurological- no focal deficit Skin- warm and dry Psychiatry- alert and oriented to person, place and time, normal mood and affect  Labs reviewed: Basic Metabolic Panel:  Recent Labs  16/10/96 0315  07/26/13 2002 07/27/13 0430  12/06/13 12/11/13 12/25/13 12/27/13 01/12/14  NA 136 137 137  < > 135* 138 139 139 139  K 3.5 3.8 4.3  < > 3.5 4.7 4.4 4.3 4.6  CL 97 96 98  --   --   --   --   --   --   CO2 28 25 24   --   --   --   --   --   --   GLUCOSE 161* 226* 435*  --   --   --   --   --   --   BUN 18 32* 36*  < > 73* 76* 60* 48* 33*  CREATININE 1.09 1.54* 1.67*  < > 2.3* 1.7* 1.3 1.3 1.3  CALCIUM 8.8 8.8 9.1  --   --   --   --   --   --   MG  --   --   --   --   --  1.9  --   --   --   PHOS  --   --   --   --   --  4.6  --  3.4  --   < > = values in this interval not displayed. Liver Function Tests:  Recent Labs  07/26/13 2002 07/27/13 0430 11/13/13 12/11/13 12/27/13  AST 19 17 10* 12* 16  ALT 22 16 13 17 22   ALKPHOS 131* 121* 105 124 167*  BILITOT 0.7 0.4  --   --   --   PROT 6.7 6.4  --   --   --   ALBUMIN 3.2* 2.8*  --   --   --    No results found for this basename: LIPASE, AMYLASE,  in the last 8760 hours No results found for this basename: AMMONIA,  in the last 8760 hours CBC:  Recent Labs  07/12/13 0315 07/26/13 2000 07/27/13 0430 12/11/13 12/27/13 01/12/14  WBC 9.8 26.8* 23.1* 10.5 11.2 12.3  NEUTROABS 7.7  --  22.3*  --   --   --   HGB 8.9* 11.3* 9.9* 10.6* 10.9* 10.6*  HCT 29.5* 36.4* 32.7* 36* 33* 34*  MCV 86.0 84.5 86.1  --   --   --   PLT 199 268 234 236 211 223   Cardiac  Enzymes:  Recent Labs  07/12/13 0315 07/27/13 0430  TROPONINI <0.30 <0.30   BNP: No components found with this basename: POCBNP,  CBG:  Recent Labs  07/29/13 0759 07/29/13 1149 08/10/13 1503  GLUCAP 151* 153* 267*   08-21-13: wbc 8.8; hgb 10.8; hct 38.3; mcv 85.1;plt 316; glucose 106; bun 13; creat 0.9; k+3.9; na++138 10-23-13: BNP: 17 10-27-13: glucose 140; bun 31; creat 1.1; k+4.2; na++140   11-06-13: glucose 98; bun 83; creat 1.9; k+4.7; na++133 11-10-13: glucose 141; bun 79; creat 1.8; k+4.8; na++135; BNP 15 11-13-13: liver normal albumin 4.0; pre-albumin 40.19    11-17-13: glucose 84; bun 87; creat 1.6; k+4.4; na++141   12-06-13: glucose 125; bun 73; creat 2.3; k+4.3; na++135   12-11-13: wbc 10.5; hgb 10.6; hct 35.7; mcv 97.5 plt 236; glucose 95; bun 76; creat 1.7; k+4.7; na++138; liver normal albumin 3.9; mag 1.9; phos 4.6   12-25-13: glucose 113; bun 60; creat 1.3; k+4.4; na++139   12-27-13: wbc 11.2; hgb 0.9; hct 32.8; mcv 90; plt 211;  glucose 115; bun 48; creat 1.29; k+4.3; na++139; liver normal albumin 4.3; iron 92; tibc 324    phos 3.4; pth 97;  vit d 12.8; hgb a1c 5.9 01-16-14 wbc 9.1, hb 11, hct 38.9, plt 217, na 141, k 3.9, glu 122, bun 28, cr 1 01-29-14 wbc 7.8, hb 10.7, plt 196,na 144, k 3.7, bun 26, cr 1, alp 178, rest lft wnl   Assessment/Plan  Congestive heart failure Stable currently. continue demadex 60 mg twice daily, aldactone 50 mg twice daily, imdur 30 mg daily. Monitor weight and counselled on fluid restriction. Patient is non complaint with his diet and fluid intake  COPD On chronic oxygen with spiriva, symbicort, flonase and prednisone for now. Monitor clinically  Dm type 2 No recent a1c. Monitor cbg. Non complaint with his diet. Continue tradjenta, levemir 40 units nightly and premeal insulin. Not on statin or aspirin. Begin aspirin 81 mg daily for now. Check a1c and lipid panel next lab draw. Start statin if ldl > 100  Neuropathic pain continue neurontin  100 mg twice daily for pain and vicodin 5/325 mg every 4 hours as needed for pain.   Anemia improved h&h. Continue iron supplement   Depression  continue celexa 40 mg daily, zyprexa 7.5 mg nightly and klonopin 1 mg twice daly as needed for anxiety   Family/ staff Communication: reviewed care plan with patient and nursing supervisor   Goals of care: long term care   Labs/tests ordered: a1c, lipid    Oneal Grout, MD  [facility] [phone] (Monday-Friday 8 am - 5 pm) [phone] (afterhours)

## 2014-01-30 ENCOUNTER — Other Ambulatory Visit: Payer: Self-pay | Admitting: *Deleted

## 2014-02-05 ENCOUNTER — Other Ambulatory Visit: Payer: Self-pay | Admitting: *Deleted

## 2014-02-05 MED ORDER — HYDROCODONE-ACETAMINOPHEN 5-325 MG PO TABS: 1.0000 | ORAL_TABLET | ORAL | Status: DC | PRN

## 2014-02-05 NOTE — Telephone Encounter (Signed)
[facility] 

## 2014-02-06 ENCOUNTER — Other Ambulatory Visit: Payer: Self-pay | Admitting: *Deleted

## 2014-02-06 MED ORDER — ZOLPIDEM TARTRATE 10 MG PO TABS: ORAL_TABLET | ORAL | Status: DC

## 2014-02-06 NOTE — Telephone Encounter (Signed)
[facility] 

## 2014-02-12 LAB — VITAMIN B12: Vitamin B12 Bind Capacity: 226

## 2014-02-12 NOTE — Progress Notes (Signed)
Patient ID: Bill [name], male   DOB: 10-17-1945, 68 y.o.   MRN: [id number]     Bill [name]  Allergies  Allergen Reactions  . Penicillins Other (See Comments)    unknown     Chief Complaint  Patient presents with  . Acute Visit    weight gain     HPI:  He continues to gain weight. He is weak and tired. He denies chest pain or shortness of breath; he does however; have edema present. He does have difficulty adhering to his fluid restriction and to his sodium restriction. He tells me that he just doesn't care.    Past Medical History  Diagnosis Date  . COPD (chronic obstructive pulmonary disease)   . Diabetes mellitus without complication   . Hyperlipidemia   . Depression   . CHF (congestive heart failure)   . Gout   . OSA (obstructive sleep apnea)   . Pneumonia 07/26/2013  . CHF with unknown LVEF 08/11/2013  . Chronic respiratory failure 06/19/2013  . GERD (gastroesophageal reflux disease) 06/19/2013  . DM type 2 with diabetic peripheral neuropathy 06/19/2013  . BPH (benign prostatic hyperplasia) 06/19/2013  . AKI (acute kidney injury) 07/27/2013  . Urinary retention 08/11/2013  . Sepsis 07/27/2013  . Physical deconditioning 06/19/2013  . Perineal abscess 08/01/2013  . Depression, controlled 06/19/2013  . Anemia of chronic disease 07/31/2013    Past Surgical History  Procedure Laterality Date  . Left eye cataract removal      VITAL SIGNS BP 120/73  Pulse 83  Ht 5\' 11"  (1.803 m)  Wt 307 lb (139.254 kg)  BMI 42.84 kg/m2  SpO2 98%   Patient's Medications  New Prescriptions   No medications on file  Previous Medications   ACETAMINOPHEN (TYLENOL) 325 MG TABLET    Take 650 mg by mouth every 4 (four) hours as needed for mild pain.   ALBUTEROL (PROVENTIL HFA;VENTOLIN HFA) 108 (90 BASE) MCG/ACT INHALER    Inhale 2 puffs into the lungs every 6 (six) hours as needed for wheezing or shortness of breath.    ALLOPURINOL (ZYLOPRIM) 300 MG TABLET    Take 1 tablet  (300 mg total) by mouth daily.   BUDESONIDE-FORMOTEROL (SYMBICORT) 160-4.5 MCG/ACT INHALER    Inhale 2 puffs into the lungs 2 (two) times daily.   CHOLECALCIFEROL (VITAMIN D3) 50000 UNITS CAPS    Take 50,000 Units by mouth every 7 (seven) days.   CITALOPRAM (CELEXA) 40 MG TABLET    Take 40 mg by mouth daily.   CLONAZEPAM (KLONOPIN) 2 MG TABLET    Take 1 mg by mouth 2 (two) times daily as needed. Take three tablets by mouth every evening as needed for severe anxiety   CRANBERRY 250 MG CAPS    Take 1 capsule by mouth 2 (two) times daily.   FERROUS SULFATE 325 (65 FE) MG TABLET    Take 325 mg by mouth 3 (three) times daily with meals.   FLUTICASONE (FLONASE) 50 MCG/ACT NASAL SPRAY    [name] 2 sprays into both nostrils daily.   GABAPENTIN (NEURONTIN) 100 MG CAPSULE    Take 100 mg by mouth 2 (two) times daily.   HYDROCODONE-ACETAMINOPHEN (NORCO/VICODIN) 5-325 MG PER TABLET    Take 1 tablet by mouth every 4 (four) hours as needed for moderate pain.   HYDROXYZINE (ATARAX/VISTARIL) 25 MG TABLET    Take 1 tablet (25 mg total) by mouth every 8 (eight) hours as needed.   INSULIN ASPART (NOVOLOG) 100 UNIT/ML  INJECTION    Inject 5 Units into the skin 3 (three) times daily before meals. For cbg >=150   INSULIN DETEMIR (LEVEMIR) 100 UNIT/ML INJECTION    Inject 0.4 mLs (40 Units total) into the skin daily.   ISOSORBIDE MONONITRATE (IMDUR) 30 MG 24 HR TABLET    Take 1 tablet (30 mg total) by mouth daily.   LINAGLIPTIN (TRADJENTA) 5 MG TABS TABLET    Take 1 tablet (5 mg total) by mouth daily.   OLANZAPINE (ZYPREXA) 10 MG TABLET    Take 10 mg by mouth daily.    PANTOPRAZOLE (PROTONIX) 40 MG TABLET    Take 1 tablet (40 mg total) by mouth daily.   POLYETHYLENE GLYCOL (MIRALAX / GLYCOLAX) PACKET    Take 17 g by mouth daily.   PREDNISONE (DELTASONE) 10 MG TABLET    Take 0.5 tablets (5 mg total) by mouth daily with breakfast.   PRIMIDONE (MYSOLINE) 250 MG TABLET    Take 250 mg by mouth 2 (two) times daily.    SENNOSIDES-DOCUSATE SODIUM (SENOKOT-S) 8.6-50 MG TABLET    Take 1 tablet by mouth 2 (two) times daily.   SPIRONOLACTONE (ALDACTONE) 25 MG TABLET    Take 2 tablets (50 mg total) by mouth 2 (two) times daily.   TAMSULOSIN (FLOMAX) 0.4 MG CAPS CAPSULE    Take 0.4 mg by mouth daily.   TIOTROPIUM (SPIRIVA) 18 MCG INHALATION CAPSULE    [name] 18 mcg into inhaler and inhale daily.   TORSEMIDE (DEMADEX) 20 MG TABLET    Take 3 tablets (60 mg total) by mouth 2 (two) times daily.   TRAMADOL (ULTRAM) 50 MG TABLET    Take 1 tablet by mouth every 12 hours as needed for pain.   ZOLPIDEM (AMBIEN) 10 MG TABLET    Take one tablet by mouth every night at bedtime for insomnia  Modified Medications   No medications on file  Discontinued Medications   No medications on file    SIGNIFICANT DIAGNOSTIC EXAMS   07-26-13: chest x-ray: minimal bibasilar pneumonitis with superimposed mild congestive changes.   08-04-13: kub: bowel gas pattern could be secondary to partial bowel obstruction versus moderate to severe ileus. No abnormal calcifications in the region of the abdomen   08-10-13: ct of pelvis: 4.1 x 2.4 cm left perineal/scrotal abscess. Associated foci of soft tissue gas, correlate for recent intervention.  Additional 4.3 x 2.3 cm left gluteal fluid collection, suspicious  for early/developing abscess.  Associated mild subcutaneous stranding in the left gluteal region,  likely reflecting cellulitis.  Mildly thick-walled bladder, correlate for cystitis.  11-09-13: chest x-ray: consistent with chronic chf; findings appear similar to previous studies. There is a question of slight progression of chf.   11-24-13: TEE: Left ventricle: The cavity size was mildly dilated. Systolic function was normal. The estimated ejection fraction was in the range of 55% to 60%. Wall motion was normal; there were no regional wall motion abnormalities.Doppler parameters are consistent with abnormal left ventricular relaxation (grade 1  diastolic dysfunction).  12-08-13: chest x-ray: minimal cardiomegaly without change with minimal pulmonary vascular congestion without change. No pleural effusion. Patchy bilateral atlectasis  or pneumonia appears new.    12-12-13: renal ultrasound: 1. No renal hydronephrosis, renal calculus or renal mass. 2. Increased renal cortical echogenicity involving both kidneys diffusely consistent with medical renal disease. 3. Pre-void urinary bladder volume 59 ml; no urinary bladder mass.   12-20-13: ekg: sinus tachycardia with pvc's   01-10-14: chest x-ray: 1. Resolution of  previously noted pneumonia lower lungs. 2. Mild cardiomegaly with borderline pulmonary vasculature.      LAS REVIEWED:   07-10-13: glucose 207; bun 18; creat 1.0; k+ 3.8; na++ 137  07-24-13: glucose 296; bun 24; creat 1.4; k+3.6; na++ 134 07-26-13: wbc 15.5; hgb 9.8; hct 34.1; mcv 85; plt 254; glucose 229; bun 25; creat 1.2; k+3.7;na++ 133 07-31-13: wbc 13.0; hgb 9.2; hct 32.8; mcv 85; plt 267; glucose 136;bun 26; creat 1.0; k+3.9; na++ 137; liver normal albumin 2.7 hgb a1c 7.7  08-02-13: glucose 97; bun 30;creat 1.2; k+4.0; na++ 140;  08-03-13: wbc 26.2; hgb 10.4; hct 35.9; mcv 85.1 plt 289; glucose 158; bun 29;creat 1.2;k+3.9; na++138  08-09-13: wbc 18.3; hgb 10.3; hct 37.6; mcv 86.2 plt 318  08-21-13: wbc 8.8; hgb 10.8; hct 38.3; mcv 85.1;plt 316; glucose 106; bun 13; creat 0.9; k+3.9; na++138 10-23-13: BNP: 17 10-27-13: glucose 140; bun 31; creat 1.1; k+4.2; na++140  11-06-13: glucose 98; bun 83; creat 1.9; k+4.7; na++133 11-10-13: glucose 141; bun 79; creat 1.8; k+4.8; na++135; BNP 15 11-13-13: liver normal albumin 4.0; pre-albumin 40.19   11-17-13: glucose 84; bun 87; creat 1.6; k+4.4; na++141  12-06-13: glucose 125; bun 73; creat 2.3; k+4.3; na++135  12-11-13: wbc 10.5; hgb 10.6; hct 35.7; mcv 97.5 plt 236; glucose 95; bun 76; creat 1.7; k+4.7; na++138; liver normal albumin 3.9; mag 1.9; phos 4.6  12-25-13: glucose 113; bun 60;  creat 1.3; k+4.4; na++139  12-27-13: wbc 11.2; hgb 0.9; hct 32.8; mcv 90; plt 211;  glucose 115; bun 48; creat 1.29; k+4.3; na++139; liver normal albumin 4.3; iron 92; tibc 324  phos 3.4; pth 97; vit d 12.8; hgb a1c 5.9 01-10-14: wbc 12.3; hgb 10.6; hct 33.7; mcv 96; plt 223; glucose 168; bun 33; creat 1.32; k+4.6; na++139 01-16-14: wbc 9.1; hgb 11.0; hct 38.9 ;mcv 103.5; plt 217; glucose 122; bun 28; creat 1.0; k+3.9; na++141 mag 2.7; folic acid 9.15; vitamin b12: 226      .Review of Systems  Constitutional: has weakness and fatigue  HENT: negative for congestion      Respiratory: negative for cough .no   shortness of breath and wheezing Cardiovascular: Negative for chest pain, palpitations edema  Gastrointestinal: Negative for heartburn, abdominal pain and constipation.  Musculoskeletal: negative for pint pain . Negative for myalgias.  Skin: negative   Neurological: Negative for dizziness, weakness and headaches.  Psychiatric/Behavioral: Negative for depression. The patient is not nervous/anxious and does not have insomnia.         Physical Exam  Constitutional: He is oriented to person, [name], and time. He appears well-developed and well-nourished. No distress.  obese  Neck: Neck supple. No JVD present. No thyromegaly present.  Cardiovascular: Normal rate, regular rhythm and intact distal pulses.   Respiratory: Effort normal breath sounds diminished. No respiratory distress.no wheezes   GI: Soft. Bowel sounds are normal. He exhibits no distension. There is no tenderness.  Musculoskeletal: Normal range of motion. Has 2+-3+  edema present .   Neurological: He is alert and oriented to person, [name], and time.  Skin: Skin is warm. He is not diaphoretic.  Psychiatric: He has a normal mood and affect.     ASSESSMENT/ PLAN:  1. CHF: is worse; will increase his demadex to 60 mg three times daily; will [name] him on a renal restriction; will check a bmp in one week.  Due to his low vit  b will [name] him on a po replacement. Will continue to monitor his status.

## 2014-03-08 ENCOUNTER — Ambulatory Visit: Payer: Self-pay | Admitting: Ophthalmology

## 2014-03-08 LAB — HEMOGLOBIN: HGB: 12.2 g/dL — AB (ref 13.0–18.0)

## 2014-03-08 LAB — POTASSIUM: Potassium: 3.4 mmol/L — ABNORMAL LOW (ref 3.5–5.1)

## 2014-03-20 ENCOUNTER — Ambulatory Visit: Payer: Self-pay | Admitting: Ophthalmology

## 2014-03-24 ENCOUNTER — Other Ambulatory Visit: Payer: Self-pay

## 2014-03-24 LAB — URINALYSIS, COMPLETE
BACTERIA: NONE SEEN
BILIRUBIN, UR: NEGATIVE
Blood: NEGATIVE
Glucose,UR: NEGATIVE mg/dL (ref 0–75)
KETONE: NEGATIVE
Leukocyte Esterase: NEGATIVE
Nitrite: NEGATIVE
Ph: 5 (ref 4.5–8.0)
Protein: NEGATIVE
SPECIFIC GRAVITY: 1.013 (ref 1.003–1.030)
Squamous Epithelial: <1
WBC UR: 3 /HPF (ref 0–5)

## 2014-03-24 LAB — BASIC METABOLIC PANEL
ANION GAP: 8 (ref 7–16)
BUN: 57 mg/dL — ABNORMAL HIGH (ref 7–18)
CREATININE: 1.79 mg/dL — AB (ref 0.60–1.30)
Calcium, Total: 9.1 mg/dL (ref 8.5–10.1)
Chloride: 88 mmol/L — ABNORMAL LOW (ref 98–107)
Co2: 39 mmol/L — ABNORMAL HIGH (ref 21–32)
EGFR (African American): 45 — ABNORMAL LOW
EGFR (Non-African Amer.): 39 — ABNORMAL LOW
GLUCOSE: 204 mg/dL — AB (ref 65–99)
OSMOLALITY: 292 (ref 275–301)
Potassium: 3.4 mmol/L — ABNORMAL LOW (ref 3.5–5.1)
SODIUM: 135 mmol/L — AB (ref 136–145)

## 2014-03-24 LAB — CBC WITH DIFFERENTIAL/PLATELET
Basophil #: 0 10*3/uL (ref 0.0–0.1)
Basophil %: 0.3 %
EOS PCT: 0.3 %
Eosinophil #: 0 10*3/uL (ref 0.0–0.7)
HCT: 36.4 % — ABNORMAL LOW (ref 40.0–52.0)
HGB: 11.1 g/dL — AB (ref 13.0–18.0)
LYMPHS ABS: 0.7 10*3/uL — AB (ref 1.0–3.6)
LYMPHS PCT: 4.6 %
MCH: 28.6 pg (ref 26.0–34.0)
MCHC: 30.6 g/dL — AB (ref 32.0–36.0)
MCV: 93 fL (ref 80–100)
MONO ABS: 1 x10 3/mm (ref 0.2–1.0)
Monocyte %: 6.7 %
NEUTROS PCT: 88.1 %
Neutrophil #: 12.7 10*3/uL — ABNORMAL HIGH (ref 1.4–6.5)
PLATELETS: 261 10*3/uL (ref 150–440)
RBC: 3.89 10*6/uL — ABNORMAL LOW (ref 4.40–5.90)
RDW: 15.2 % — AB (ref 11.5–14.5)
WBC: 14.4 10*3/uL — AB (ref 3.8–10.6)

## 2014-03-24 LAB — PRO B NATRIURETIC PEPTIDE: B-Type Natriuretic Peptide: 704 pg/mL — ABNORMAL HIGH (ref 0–125)

## 2014-03-26 LAB — URINE CULTURE

## 2014-03-28 ENCOUNTER — Encounter: Payer: Self-pay | Admitting: Adult Health

## 2014-03-28 ENCOUNTER — Non-Acute Institutional Stay (SKILLED_NURSING_FACILITY): Payer: Medicare Other | Admitting: Adult Health

## 2014-03-28 DIAGNOSIS — J449 Chronic obstructive pulmonary disease, unspecified: Secondary | ICD-10-CM | POA: Diagnosis not present

## 2014-03-28 DIAGNOSIS — G250 Essential tremor: Secondary | ICD-10-CM

## 2014-03-28 DIAGNOSIS — E1142 Type 2 diabetes mellitus with diabetic polyneuropathy: Secondary | ICD-10-CM | POA: Diagnosis not present

## 2014-03-28 DIAGNOSIS — I5032 Chronic diastolic (congestive) heart failure: Secondary | ICD-10-CM

## 2014-03-28 DIAGNOSIS — I509 Heart failure, unspecified: Secondary | ICD-10-CM | POA: Diagnosis not present

## 2014-03-28 DIAGNOSIS — E1149 Type 2 diabetes mellitus with other diabetic neurological complication: Secondary | ICD-10-CM | POA: Diagnosis not present

## 2014-03-28 DIAGNOSIS — N184 Chronic kidney disease, stage 4 (severe): Secondary | ICD-10-CM

## 2014-03-28 DIAGNOSIS — F411 Generalized anxiety disorder: Secondary | ICD-10-CM | POA: Diagnosis not present

## 2014-03-28 DIAGNOSIS — G252 Other specified forms of tremor: Secondary | ICD-10-CM

## 2014-03-28 DIAGNOSIS — I150 Renovascular hypertension: Secondary | ICD-10-CM

## 2014-03-28 DIAGNOSIS — D638 Anemia in other chronic diseases classified elsewhere: Secondary | ICD-10-CM | POA: Diagnosis not present

## 2014-03-28 DIAGNOSIS — F3289 Other specified depressive episodes: Secondary | ICD-10-CM | POA: Diagnosis not present

## 2014-03-28 DIAGNOSIS — F32A Depression, unspecified: Secondary | ICD-10-CM

## 2014-03-28 DIAGNOSIS — F329 Major depressive disorder, single episode, unspecified: Secondary | ICD-10-CM

## 2014-03-30 ENCOUNTER — Inpatient Hospital Stay: Payer: Self-pay | Admitting: Cardiovascular Disease

## 2014-03-30 DIAGNOSIS — N179 Acute kidney failure, unspecified: Secondary | ICD-10-CM | POA: Diagnosis not present

## 2014-03-30 DIAGNOSIS — J9600 Acute respiratory failure, unspecified whether with hypoxia or hypercapnia: Secondary | ICD-10-CM | POA: Diagnosis not present

## 2014-03-30 DIAGNOSIS — I2510 Atherosclerotic heart disease of native coronary artery without angina pectoris: Secondary | ICD-10-CM | POA: Diagnosis not present

## 2014-03-30 DIAGNOSIS — I2119 ST elevation (STEMI) myocardial infarction involving other coronary artery of inferior wall: Secondary | ICD-10-CM | POA: Diagnosis not present

## 2014-03-30 DIAGNOSIS — N189 Chronic kidney disease, unspecified: Secondary | ICD-10-CM

## 2014-03-30 LAB — BASIC METABOLIC PANEL
Anion Gap: 12 (ref 7–16)
BUN: 136 mg/dL — AB (ref 7–18)
CALCIUM: 9.5 mg/dL (ref 8.5–10.1)
Chloride: 83 mmol/L — ABNORMAL LOW (ref 98–107)
Co2: 36 mmol/L — ABNORMAL HIGH (ref 21–32)
Creatinine: 1.65 mg/dL — ABNORMAL HIGH (ref 0.60–1.30)
EGFR (African American): 50 — ABNORMAL LOW
EGFR (Non-African Amer.): 43 — ABNORMAL LOW
Glucose: 190 mg/dL — ABNORMAL HIGH (ref 65–99)
Osmolality: 312 (ref 275–301)
Potassium: 3.4 mmol/L — ABNORMAL LOW (ref 3.5–5.1)
Sodium: 131 mmol/L — ABNORMAL LOW (ref 136–145)

## 2014-03-30 LAB — COMPREHENSIVE METABOLIC PANEL
ALBUMIN: 2.6 g/dL — AB (ref 3.4–5.0)
ANION GAP: 10 (ref 7–16)
AST: 33 U/L (ref 15–37)
Alkaline Phosphatase: 232 U/L — ABNORMAL HIGH
BILIRUBIN TOTAL: 0.4 mg/dL (ref 0.2–1.0)
BUN: 134 mg/dL — ABNORMAL HIGH (ref 7–18)
CHLORIDE: 80 mmol/L — AB (ref 98–107)
CO2: 38 mmol/L — AB (ref 21–32)
CREATININE: 1.8 mg/dL — AB (ref 0.60–1.30)
Calcium, Total: 10.2 mg/dL — ABNORMAL HIGH (ref 8.5–10.1)
EGFR (African American): 45 — ABNORMAL LOW
EGFR (Non-African Amer.): 39 — ABNORMAL LOW
Glucose: 222 mg/dL — ABNORMAL HIGH (ref 65–99)
Osmolality: 307 (ref 275–301)
POTASSIUM: 3.4 mmol/L — AB (ref 3.5–5.1)
SGPT (ALT): 25 U/L
Sodium: 128 mmol/L — ABNORMAL LOW (ref 136–145)
Total Protein: 8.7 g/dL — ABNORMAL HIGH (ref 6.4–8.2)

## 2014-03-30 LAB — URINALYSIS, COMPLETE
BACTERIA: NONE SEEN
BLOOD: NEGATIVE
Bilirubin,UR: NEGATIVE
Glucose,UR: NEGATIVE mg/dL (ref 0–75)
Ketone: NEGATIVE
LEUKOCYTE ESTERASE: NEGATIVE
NITRITE: NEGATIVE
PH: 6 (ref 4.5–8.0)
Protein: NEGATIVE
RBC,UR: 4 /HPF (ref 0–5)
SPECIFIC GRAVITY: 1.011 (ref 1.003–1.030)
SQUAMOUS EPITHELIAL: NONE SEEN
WBC UR: <1 /HPF (ref 0–5)

## 2014-03-30 LAB — CBC
HCT: 38.4 % — ABNORMAL LOW (ref 40.0–52.0)
HGB: 12.1 g/dL — AB (ref 13.0–18.0)
MCH: 28.7 pg (ref 26.0–34.0)
MCHC: 31.6 g/dL — AB (ref 32.0–36.0)
MCV: 91 fL (ref 80–100)
Platelet: 431 10*3/uL (ref 150–440)
RBC: 4.23 10*6/uL — ABNORMAL LOW (ref 4.40–5.90)
RDW: 15.4 % — ABNORMAL HIGH (ref 11.5–14.5)
WBC: 16.6 10*3/uL — AB (ref 3.8–10.6)

## 2014-03-30 LAB — TROPONIN I: Troponin-I: <0.02 ng/mL

## 2014-03-30 LAB — PROTIME-INR
INR: 2
INR: 2
PROTHROMBIN TIME: 22 s — AB (ref 11.5–14.7)
Prothrombin Time: 22.3 secs — ABNORMAL HIGH (ref 11.5–14.7)

## 2014-03-30 LAB — APTT: Activated PTT: 48 secs — ABNORMAL HIGH (ref 23.6–35.9)

## 2014-03-30 LAB — AMMONIA: Ammonia, Plasma: <10 mcmol/L (ref 11–32)

## 2014-03-31 ENCOUNTER — Ambulatory Visit: Payer: Self-pay | Admitting: Internal Medicine

## 2014-03-31 DIAGNOSIS — R0602 Shortness of breath: Secondary | ICD-10-CM | POA: Diagnosis not present

## 2014-03-31 LAB — CBC WITH DIFFERENTIAL/PLATELET
Basophil #: 0.1 10*3/uL (ref 0.0–0.1)
Basophil %: 0.6 %
Eosinophil #: 0.2 10*3/uL (ref 0.0–0.7)
Eosinophil %: 1.1 %
HCT: 38.7 % — AB (ref 40.0–52.0)
HGB: 12 g/dL — ABNORMAL LOW (ref 13.0–18.0)
LYMPHS ABS: 0.6 10*3/uL — AB (ref 1.0–3.6)
LYMPHS PCT: 3.5 %
MCH: 28 pg (ref 26.0–34.0)
MCHC: 31 g/dL — AB (ref 32.0–36.0)
MCV: 90 fL (ref 80–100)
MONOS PCT: 4.8 %
Monocyte #: 0.8 x10 3/mm (ref 0.2–1.0)
Neutrophil #: 15.3 10*3/uL — ABNORMAL HIGH (ref 1.4–6.5)
Neutrophil %: 90 %
PLATELETS: 436 10*3/uL (ref 150–440)
RBC: 4.28 10*6/uL — ABNORMAL LOW (ref 4.40–5.90)
RDW: 15.4 % — ABNORMAL HIGH (ref 11.5–14.5)
WBC: 17 10*3/uL — ABNORMAL HIGH (ref 3.8–10.6)

## 2014-03-31 LAB — COMPREHENSIVE METABOLIC PANEL
ALK PHOS: 219 U/L — AB
ANION GAP: 13 (ref 7–16)
Albumin: 2.5 g/dL — ABNORMAL LOW (ref 3.4–5.0)
BUN: 129 mg/dL — ABNORMAL HIGH (ref 7–18)
Bilirubin,Total: 0.4 mg/dL (ref 0.2–1.0)
CREATININE: 1.6 mg/dL — AB (ref 0.60–1.30)
Calcium, Total: 9.8 mg/dL (ref 8.5–10.1)
Chloride: 86 mmol/L — ABNORMAL LOW (ref 98–107)
Co2: 37 mmol/L — ABNORMAL HIGH (ref 21–32)
EGFR (African American): 52 — ABNORMAL LOW
GFR CALC NON AF AMER: 45 — AB
Glucose: 110 mg/dL — ABNORMAL HIGH (ref 65–99)
Osmolality: 314 (ref 275–301)
Potassium: 3 mmol/L — ABNORMAL LOW (ref 3.5–5.1)
SGOT(AST): 34 U/L (ref 15–37)
SGPT (ALT): 27 U/L
Sodium: 136 mmol/L (ref 136–145)
Total Protein: 8.2 g/dL (ref 6.4–8.2)

## 2014-03-31 LAB — PROTIME-INR
INR: 1.7
Prothrombin Time: 19.6 secs — ABNORMAL HIGH (ref 11.5–14.7)

## 2014-03-31 LAB — MAGNESIUM: MAGNESIUM: 2.2 mg/dL

## 2014-04-01 LAB — CBC WITH DIFFERENTIAL/PLATELET
Basophil #: 0.1 10*3/uL (ref 0.0–0.1)
Basophil %: 0.6 %
Eosinophil #: 0.2 10*3/uL (ref 0.0–0.7)
Eosinophil %: 1.8 %
HCT: 36.6 % — AB (ref 40.0–52.0)
HGB: 11.3 g/dL — AB (ref 13.0–18.0)
Lymphocyte #: 0.5 10*3/uL — ABNORMAL LOW (ref 1.0–3.6)
Lymphocyte %: 4.1 %
MCH: 28 pg (ref 26.0–34.0)
MCHC: 30.9 g/dL — ABNORMAL LOW (ref 32.0–36.0)
MCV: 91 fL (ref 80–100)
Monocyte #: 0.6 x10 3/mm (ref 0.2–1.0)
Monocyte %: 4.7 %
Neutrophil #: 11.7 10*3/uL — ABNORMAL HIGH (ref 1.4–6.5)
Neutrophil %: 88.8 %
Platelet: 397 10*3/uL (ref 150–440)
RBC: 4.04 10*6/uL — ABNORMAL LOW (ref 4.40–5.90)
RDW: 15.3 % — AB (ref 11.5–14.5)
WBC: 13.2 10*3/uL — ABNORMAL HIGH (ref 3.8–10.6)

## 2014-04-01 LAB — BASIC METABOLIC PANEL
ANION GAP: 8 (ref 7–16)
BUN: 110 mg/dL — ABNORMAL HIGH (ref 7–18)
CHLORIDE: 93 mmol/L — AB (ref 98–107)
CREATININE: 1.26 mg/dL (ref 0.60–1.30)
Calcium, Total: 9.6 mg/dL (ref 8.5–10.1)
Co2: 36 mmol/L — ABNORMAL HIGH (ref 21–32)
EGFR (African American): >60
EGFR (Non-African Amer.): 59 — ABNORMAL LOW
Glucose: 148 mg/dL — ABNORMAL HIGH (ref 65–99)
Osmolality: 311 (ref 275–301)
POTASSIUM: 3.3 mmol/L — AB (ref 3.5–5.1)
Sodium: 137 mmol/L (ref 136–145)

## 2014-04-01 LAB — URIC ACID: Uric Acid: 10.9 mg/dL — ABNORMAL HIGH (ref 3.5–7.2)

## 2014-04-02 ENCOUNTER — Encounter: Payer: Self-pay | Admitting: Cardiovascular Disease

## 2014-04-02 DIAGNOSIS — R0602 Shortness of breath: Secondary | ICD-10-CM | POA: Diagnosis not present

## 2014-04-02 LAB — BASIC METABOLIC PANEL
ANION GAP: 5 — AB (ref 7–16)
BUN: 82 mg/dL — ABNORMAL HIGH (ref 7–18)
Calcium, Total: 9.3 mg/dL (ref 8.5–10.1)
Chloride: 101 mmol/L (ref 98–107)
Co2: 34 mmol/L — ABNORMAL HIGH (ref 21–32)
Creatinine: 1.09 mg/dL (ref 0.60–1.30)
EGFR (African American): >60
EGFR (Non-African Amer.): >60
GLUCOSE: 135 mg/dL — AB (ref 65–99)
Osmolality: 306 (ref 275–301)
Potassium: 3.7 mmol/L (ref 3.5–5.1)
Sodium: 140 mmol/L (ref 136–145)

## 2014-04-02 LAB — PROTIME-INR
INR: 1.9
Prothrombin Time: 21.1 secs — ABNORMAL HIGH (ref 11.5–14.7)

## 2014-04-03 ENCOUNTER — Telehealth: Payer: Self-pay

## 2014-04-03 DIAGNOSIS — R0602 Shortness of breath: Secondary | ICD-10-CM | POA: Diagnosis not present

## 2014-04-03 LAB — BASIC METABOLIC PANEL
Anion Gap: 7 (ref 7–16)
BUN: 64 mg/dL — ABNORMAL HIGH (ref 7–18)
Calcium, Total: 9.5 mg/dL (ref 8.5–10.1)
Chloride: 98 mmol/L (ref 98–107)
Co2: 34 mmol/L — ABNORMAL HIGH (ref 21–32)
Creatinine: 0.9 mg/dL (ref 0.60–1.30)
EGFR (African American): >60
EGFR (Non-African Amer.): >60
Glucose: 139 mg/dL — ABNORMAL HIGH (ref 65–99)
Osmolality: 298 (ref 275–301)
Potassium: 3.1 mmol/L — ABNORMAL LOW (ref 3.5–5.1)
Sodium: 139 mmol/L (ref 136–145)

## 2014-04-03 LAB — MAGNESIUM: MAGNESIUM: 2.4 mg/dL

## 2014-04-03 NOTE — Telephone Encounter (Signed)
Patient contacted regarding discharge from [facility] on 04/03/14.  Patient understands to follow up with provider Solon Palmhris Berge,NP on 04/10/14 [facility] 2[facility]. Patient understands discharge instructions? yes Patient understands medications and regiment? yes Patient understands to bring all medications to this visit? yes

## 2014-04-04 ENCOUNTER — Non-Acute Institutional Stay (SKILLED_NURSING_FACILITY): Payer: Medicare Other | Admitting: Adult Health

## 2014-04-04 DIAGNOSIS — N400 Enlarged prostate without lower urinary tract symptoms: Secondary | ICD-10-CM

## 2014-04-04 DIAGNOSIS — J441 Chronic obstructive pulmonary disease with (acute) exacerbation: Secondary | ICD-10-CM

## 2014-04-04 DIAGNOSIS — R0902 Hypoxemia: Secondary | ICD-10-CM

## 2014-04-04 DIAGNOSIS — I150 Renovascular hypertension: Secondary | ICD-10-CM

## 2014-04-04 DIAGNOSIS — I509 Heart failure, unspecified: Secondary | ICD-10-CM

## 2014-04-04 DIAGNOSIS — J9611 Chronic respiratory failure with hypoxia: Secondary | ICD-10-CM

## 2014-04-04 DIAGNOSIS — G252 Other specified forms of tremor: Secondary | ICD-10-CM

## 2014-04-04 DIAGNOSIS — F411 Generalized anxiety disorder: Secondary | ICD-10-CM

## 2014-04-04 DIAGNOSIS — E1142 Type 2 diabetes mellitus with diabetic polyneuropathy: Secondary | ICD-10-CM

## 2014-04-04 DIAGNOSIS — J9610 Chronic respiratory failure, unspecified whether with hypoxia or hypercapnia: Secondary | ICD-10-CM

## 2014-04-04 DIAGNOSIS — E1149 Type 2 diabetes mellitus with other diabetic neurological complication: Secondary | ICD-10-CM

## 2014-04-04 DIAGNOSIS — I4891 Unspecified atrial fibrillation: Secondary | ICD-10-CM

## 2014-04-04 DIAGNOSIS — F329 Major depressive disorder, single episode, unspecified: Secondary | ICD-10-CM

## 2014-04-04 DIAGNOSIS — I5032 Chronic diastolic (congestive) heart failure: Secondary | ICD-10-CM

## 2014-04-04 DIAGNOSIS — I482 Chronic atrial fibrillation, unspecified: Secondary | ICD-10-CM

## 2014-04-04 DIAGNOSIS — G250 Essential tremor: Secondary | ICD-10-CM

## 2014-04-04 DIAGNOSIS — F3289 Other specified depressive episodes: Secondary | ICD-10-CM

## 2014-04-04 DIAGNOSIS — F32A Depression, unspecified: Secondary | ICD-10-CM

## 2014-04-04 DIAGNOSIS — N184 Chronic kidney disease, stage 4 (severe): Secondary | ICD-10-CM

## 2014-04-09 ENCOUNTER — Encounter: Payer: Self-pay | Admitting: *Deleted

## 2014-04-09 MED ORDER — FERROUS SULFATE 325 (65 FE) MG PO TABS: 325.0000 mg | ORAL_TABLET | Freq: Every day | ORAL | Status: DC

## 2014-04-09 NOTE — Progress Notes (Signed)
Patient ID: Bill MeekerJesse C [name], male   DOB: November 14, 1945, 68 y.o.   MRN: [id number]     [facility]  Allergies  Allergen Reactions  . Penicillins Other (See Comments)    unknown     Chief Complaint  Patient presents with  . Hospitalization Follow-up    HPI:  He has been hospitalized for his diastolic heart failure and copd with chronic respiratory failure. He is a long term resident of this facility and is followed by hospice care. He did have a cardiac cath which did not demonstrate any significant cad; he was ruled out for a MI. He is complaining of left hand pain which is hot; red; slightly swollen and tender to touch. He feels as though this is a gouty flare. There are no concerns being voiced at this time by the nursing staff.    Past Medical History  Diagnosis Date  . COPD (chronic obstructive pulmonary disease)   . Diabetes mellitus without complication   . Hyperlipidemia   . Depression   . CHF (congestive heart failure)   . Gout   . OSA (obstructive sleep apnea)   . Pneumonia 07/26/2013  . CHF with unknown LVEF 08/11/2013  . Chronic respiratory failure 06/19/2013  . GERD (gastroesophageal reflux disease) 06/19/2013  . DM type 2 with diabetic peripheral neuropathy 06/19/2013  . BPH (benign prostatic hyperplasia) 06/19/2013  . AKI (acute kidney injury) 07/27/2013  . Urinary retention 08/11/2013  . Sepsis 07/27/2013  . Physical deconditioning 06/19/2013  . Perineal abscess 08/01/2013  . Depression, controlled 06/19/2013  . Anemia of chronic disease 07/31/2013  . A-fib     Past Surgical History  Procedure Laterality Date  . Left eye cataract removal      VITAL SIGNS BP 148/69  Pulse 76  Ht 5\' 11"  (1.803 m)  Wt 280 lb (127.007 kg)  BMI 39.07 kg/m2  SpO2 97%   Patient's Medications  New Prescriptions   No medications on file  Previous Medications   ALBUTEROL (PROVENTIL HFA;VENTOLIN HFA) 108 (90 BASE) MCG/ACT INHALER    Inhale 2 puffs into the lungs every 6  (six) hours as needed for wheezing or shortness of breath.    ALLOPURINOL (ZYLOPRIM) 300 MG TABLET    Take 1 tablet (300 mg total) by mouth daily.   eliquis 5 mg Take 5 mg twice daily    ASPIRIN 81 MG TABLET    Take 81 mg by mouth daily.   BUDESONIDE-FORMOTEROL (SYMBICORT) 160-4.5 MCG/ACT INHALER    Inhale 2 puffs into the lungs 2 (two) times daily.   CITALOPRAM (CELEXA) 40 MG TABLET    Take 40 mg by mouth daily.   CLONAZEPAM (KLONOPIN) 2 MG TABLET    Take 1 mg by mouth 2 (two) times daily as needed. Take three tablets by mouth every evening as needed for severe anxiety   COLCHICINE 0.6 MG TABLET    Take 0.6 mg by mouth daily as needed.   CRANBERRY 250 MG CAPS    Take 1 capsule by mouth 2 (two) times daily.   FLUTICASONE (FLONASE) 50 MCG/ACT NASAL SPRAY    Place 2 sprays into both nostrils daily.   GABAPENTIN (NEURONTIN) 100 MG CAPSULE    Take 100 mg by mouth 2 (two) times daily.   HYDROCODONE-ACETAMINOPHEN (NORCO/VICODIN) 5-325 MG PER TABLET    Take 1 tablet by mouth every 4 (four) hours as needed for moderate pain.   HYDROXYZINE (ATARAX/VISTARIL) 25 MG TABLET    Take 1 tablet (25  mg total) by mouth every 8 (eight) hours as needed.   INSULIN ASPART (NOVOLOG) 100 UNIT/ML INJECTION    Inject 5 Units into the skin 3 (three) times daily before meals. For cbg >=150   IPRATROPIUM (ATROVENT) 0.02 % NEBULIZER SOLUTION    Take 0.5 mg by nebulization every 6 (six) hours.   ISOSORBIDE MONONITRATE (IMDUR) 30 MG 24 HR TABLET    Take 1 tablet (30 mg total) by mouth daily.   LINAGLIPTIN (TRADJENTA) 5 MG TABS TABLET    Take 1 tablet (5 mg total) by mouth daily.   METOLAZONE (ZAROXOLYN) 2.5 MG TABLET    Take 2.5 mg by mouth 3 (three) times a week. Monday Wednesday Friday   METOPROLOL TARTRATE (LOPRESSOR) 25 MG TABLET    Take 12.5 mg by mouth 2 (two) times daily.   OLANZAPINE (ZYPREXA) 10 MG TABLET    Take 15 mg by mouth daily.    POLYETHYLENE GLYCOL (MIRALAX / GLYCOLAX) PACKET    Take 17 g by mouth daily.    POTASSIUM CHLORIDE SA (K-DUR,KLOR-CON) 20 MEQ TABLET    Take 20 mEq by mouth daily.   PREDNISONE (DELTASONE) 10 MG TABLET    Take 0.5 tablets (5 mg total) by mouth daily with breakfast.   PRIMIDONE (MYSOLINE) 250 MG TABLET    Take 250 mg by mouth 2 (two) times daily.   SENNOSIDES-DOCUSATE SODIUM (SENOKOT-S) 8.6-50 MG TABLET    Take 2 tablets by mouth 2 (two) times daily.    SPIRONOLACTONE (ALDACTONE) 25 MG TABLET    Take 2 tablets (50 mg total) by mouth 2 (two) times daily.   TAMSULOSIN (FLOMAX) 0.4 MG CAPS CAPSULE    Take 0.4 mg by mouth daily.   TIOTROPIUM (SPIRIVA) 18 MCG INHALATION CAPSULE    Place 18 mcg into inhaler and inhale daily.   TORSEMIDE (DEMADEX) 20 MG TABLET    Take 20 mg by mouth 2 (two) times daily.    TRAMADOL (ULTRAM) 50 MG TABLET    Take 1 tablet by mouth every 12 hours as needed for pain.   ZOLPIDEM (AMBIEN) 10 MG TABLET    Take one tablet by mouth every night at bedtime for insomnia  Modified Medications   Modified Medication Previous Medication   INSULIN DETEMIR (LEVEMIR) 100 UNIT/ML INJECTION insulin detemir (LEVEMIR) 100 UNIT/ML injection      Inject 10 Units into the skin daily.    Inject 0.4 mLs (40 Units total) into the skin daily.   PANTOPRAZOLE (PROTONIX) 40 MG TABLET pantoprazole (PROTONIX) 40 MG tablet      Take 40 mg by mouth 2 (two) times daily.    Take 1 tablet (40 mg total) by mouth daily.  Discontinued Medications   ACETAMINOPHEN (TYLENOL) 325 MG TABLET    Take 650 mg by mouth every 4 (four) hours as needed for mild pain.   CHOLECALCIFEROL (VITAMIN D3) 50000 UNITS CAPS    Take 50,000 Units by mouth every 7 (seven) days.   FERROUS SULFATE 325 (65 FE) MG TABLET    Take 1 tablet (325 mg total) by mouth daily with breakfast.   IPRATROPIUM-ALBUTEROL (DUONEB) 0.5-2.5 (3) MG/3ML SOLN    Take 3 mLs by nebulization 2 (two) times daily.    SIGNIFICANT DIAGNOSTIC EXAMS   07-26-13: chest x-ray: minimal bibasilar pneumonitis with superimposed mild congestive  changes.   08-04-13: kub: bowel gas pattern could be secondary to partial bowel obstruction versus moderate to severe ileus. No abnormal calcifications in the region of the abdomen  08-10-13: ct of pelvis: 4.1 x 2.4 cm left perineal/scrotal abscess. Associated foci of soft tissue gas, correlate for recent intervention.  Additional 4.3 x 2.3 cm left gluteal fluid collection, suspicious  for early/developing abscess.  Associated mild subcutaneous stranding in the left gluteal region,  likely reflecting cellulitis.  Mildly thick-walled bladder, correlate for cystitis.  11-09-13: chest x-ray: consistent with chronic chf; findings appear similar to previous studies. There is a question of slight progression of chf.   11-24-13: TEE: Left ventricle: The cavity size was mildly dilated. Systolic function was normal. The estimated ejection fraction was in the range of 55% to 60%. Wall motion was normal; there were no regional wall motion abnormalities.Doppler parameters are consistent with abnormal left ventricular relaxation (grade 1 diastolic dysfunction).  12-08-13: chest x-ray: minimal cardiomegaly without change with minimal pulmonary vascular congestion without change. No pleural effusion. Patchy bilateral atlectasis  or pneumonia appears new.    12-12-13: renal ultrasound: 1. No renal hydronephrosis, renal calculus or renal mass. 2. Increased renal cortical echogenicity involving both kidneys diffusely consistent with medical renal disease. 3. Pre-void urinary bladder volume 59 ml; no urinary bladder mass.   12-20-13: ekg: sinus tachycardia with pvc's   01-10-14: chest x-ray: 1. Resolution of previously noted pneumonia lower lungs. 2. Mild cardiomegaly with borderline pulmonary vasculature.   03-30-14: ct of head: chronic changes without acute abnormality   03-30-14: cardiac cath: ef 40%; global hypokinesis more focal in basal inferior wall.   03-31-14: 2-d echo: EF 40-45%; moderate left ventricular  hypertrophy.      LAS REVIEWED:   07-10-13: glucose 207; bun 18; creat 1.0; k+ 3.8; na++ 137  07-24-13: glucose 296; bun 24; creat 1.4; k+3.6; na++ 134 07-26-13: wbc 15.5; hgb 9.8; hct 34.1; mcv 85; plt 254; glucose 229; bun 25; creat 1.2; k+3.7;na++ 133 07-31-13: wbc 13.0; hgb 9.2; hct 32.8; mcv 85; plt 267; glucose 136;bun 26; creat 1.0; k+3.9; na++ 137; liver normal albumin 2.7 hgb a1c 7.7  08-02-13: glucose 97; bun 30;creat 1.2; k+4.0; na++ 140;  08-03-13: wbc 26.2; hgb 10.4; hct 35.9; mcv 85.1 plt 289; glucose 158; bun 29;creat 1.2;k+3.9; na++138  08-09-13: wbc 18.3; hgb 10.3; hct 37.6; mcv 86.2 plt 318  08-21-13: wbc 8.8; hgb 10.8; hct 38.3; mcv 85.1;plt 316; glucose 106; bun 13; creat 0.9; k+3.9; na++138 10-23-13: BNP: 17 10-27-13: glucose 140; bun 31; creat 1.1; k+4.2; na++140  11-06-13: glucose 98; bun 83; creat 1.9; k+4.7; na++133 11-10-13: glucose 141; bun 79; creat 1.8; k+4.8; na++135; BNP 15 11-13-13: liver normal albumin 4.0; pre-albumin 40.19   11-17-13: glucose 84; bun 87; creat 1.6; k+4.4; na++141  12-06-13: glucose 125; bun 73; creat 2.3; k+4.3; na++135  12-11-13: wbc 10.5; hgb 10.6; hct 35.7; mcv 97.5 plt 236; glucose 95; bun 76; creat 1.7; k+4.7; na++138; liver normal albumin 3.9; mag 1.9; phos 4.6  12-25-13: glucose 113; bun 60; creat 1.3; k+4.4; na++139  12-27-13: wbc 11.2; hgb 0.9; hct 32.8; mcv 90; plt 211;  glucose 115; bun 48; creat 1.29; k+4.3; na++139; liver normal albumin 4.3; iron 92; tibc 324  phos 3.4; pth 97; vit d 12.8; hgb a1c 5.9 01-10-14: wbc 12.3; hgb 10.6; hct 33.7; mcv 96; plt 223; glucose 168; bun 33; creat 1.32; k+4.6; na++139 01-16-14: wbc 9.1; hgb 11.0; hct 38.9 ;mcv 103.5; plt 217; glucose 122; bun 28; creat 1.0; k+3.9; na++141 mag 2.7; folic acid 9.15; vitamin b12: 226  02-15-14: wbc 9.5; hgb 10.1; hct 35.3; mcv 103.5; plt 234; glucose 157; bun 26; creat  1.1; k+3.7; na++141; mag 2.0; folic acid 7.63; vit b12: 585 03-24-14: wbc 16.8; hgb 11.2; hct 39.5; mcv 99.8;  plt 317; glucose 221; bun 110; creat 1.9; k+3.6; na++137 03-27-14: bnp 212 03-31-14: wbc 16.6; hgb 12.1; 38.4; mcv 91; plt 431 glucose 110; bun 129; creat 1.60; k+3.0; na++136 liver normal albumin 2.5  04-01-14: wbc 13.2; hgb 11.3; hct 36.6 ;mcv 91.1; plt 397  04-03-14: glucose 139; bun 64; creat 0.90; k+3.1; na++139; mag 2.4     .Review of Systems  Constitutional: has weakness and fatigue  HENT: negative for congestion      Respiratory: negative for cough .no   shortness of breath and wheezing Cardiovascular: Negative for chest pain, palpitations edema  Gastrointestinal: Negative for heartburn, abdominal pain and constipation.  Musculoskeletal: has left hand and thumb pain with redness and tenderness present  Skin: negative   Psychiatric/Behavioral: The patient is not nervous/anxious and does not have insomnia.         Physical Exam  Constitutional: He is oriented to person, place, and time. He appears well-developed and well-nourished. No distress.  obese  Neck: Neck supple. No JVD present. No thyromegaly present.  Cardiovascular: Normal rate, regular rhythm and intact distal pulses.   Respiratory: Effort normal breath sounds diminished. No respiratory distress.no wheezes   GI: Soft. Bowel sounds are normal. He exhibits no distension. There is no tenderness.  Musculoskeletal: Normal range of motion. Has 1-2+ edema present ; left hand is slightly swollen with redness and tenderness present. .   Neurological: He is alert and oriented to person, place, and time.  Skin: Skin is warm. He is not diaphoretic.  Psychiatric: He has a normal mood and affect.       ASSESSMENT/ PLAN:  1.  Diastolic hear failure: is presently without change in status; will continue daily weights; is off fluid restrictions; aldactone 50 mg twice daily; demadex 20 mg twice daily and zaroxolyn 2.5 mg three times weekly  will monitor  2. end stage copd: he is without change in status;  Will continue prednisone  5 mg daily; prednisone 5 mg daily; symbicort 160/4.5 mcg 2 puffs twice daily; albuterol 2 puffs every 6 hours as needed; duoneb twice daily flonase daily and will monitor his status.   3. Diabetes: is presently stable will continue novolog 5 units prior to meals for cbg >=150; levemir 10 units nightly and tradjenta 5 mg daily   4. Hypertension: is stable; will continue asa 81 mg daily and imdur 30 mg daily   5. Essential tremor: is stable will continue primidone 250 mg twice daily   6. Anemia: will stop his iron and will check cbc in 2 months    7. Gout: he is currently having a flare in his left hand; will continue his allopurinol 300 mg daily and will being colchicine 0.6 mg twice daily for one week and will monitor   8. Gerd: will continue protonix 40 mg daily  9. Constipation: will continue senna s 2 tabs twice daily and miralax daily   10. Depression with anxiety: will continue his celexa 40 mg daily with zyprexa 15 mg daily; will continue klonopin 1 mg twice daily as needed   11. BPH: will continue flomax daily  12. CKD stage IV: no change in his renal function he is tolerating his medications will continue to monitor his status.   13. Afib: his heart rate is presently stable will continue eliquis 5 mg twice daily and will monitor  Time spent with patient 50 minutes.     Synthia Innocent NP [facility]  Contact [phone] Monday through Friday 8am- 5pm  After hours call [phone]

## 2014-04-09 NOTE — Progress Notes (Signed)
Patient ID: Bill [name], male   DOB: 1946-03-08, 68 y.o.   MRN: [id number]     [facility]  Allergies  Allergen Reactions  . Penicillins Other (See Comments)    unknown     Chief Complaint  Patient presents with  . Medical Management of Chronic Issues    HPI:  He is being seen for the management of his chronic illnesses. He is a long term resident of this facility. He is now being followed by hospice care for his copd and chf. He is very weak. He has declined since I have seen him last. He is not voicing any complaints at this time. There are no concerns at this time by the nursing staff.    Past Medical History  Diagnosis Date  . COPD (chronic obstructive pulmonary disease)   . Diabetes mellitus without complication   . Hyperlipidemia   . Depression   . CHF (congestive heart failure)   . Gout   . OSA (obstructive sleep apnea)   . Pneumonia 07/26/2013  . CHF with unknown LVEF 08/11/2013  . Chronic respiratory failure 06/19/2013  . GERD (gastroesophageal reflux disease) 06/19/2013  . DM type 2 with diabetic peripheral neuropathy 06/19/2013  . BPH (benign prostatic hyperplasia) 06/19/2013  . AKI (acute kidney injury) 07/27/2013  . Urinary retention 08/11/2013  . Sepsis 07/27/2013  . Physical deconditioning 06/19/2013  . Perineal abscess 08/01/2013  . Depression, controlled 06/19/2013  . Anemia of chronic disease 07/31/2013  . A-fib     Past Surgical History  Procedure Laterality Date  . Left eye cataract removal      VITAL SIGNS BP 101/55  Pulse 95  Temp(Src) 98.6 F (37 C)  Resp 20  Ht 5\' 11"  (1.803 m)  Wt 298 lb 12.8 oz (135.535 kg)  BMI 41.69 kg/m2  SpO2 97%   Patient's Medications  New Prescriptions   No medications on file  Previous Medications   ACETAMINOPHEN (TYLENOL) 325 MG TABLET    Take 650 mg by mouth every 4 (four) hours as needed for mild pain.   ALBUTEROL (PROVENTIL HFA;VENTOLIN HFA) 108 (90 BASE) MCG/ACT INHALER    Inhale 2 puffs into  the lungs every 6 (six) hours as needed for wheezing or shortness of breath.    ALLOPURINOL (ZYLOPRIM) 300 MG TABLET    Take 1 tablet (300 mg total) by mouth daily.   ASPIRIN 81 MG TABLET    Take 81 mg by mouth daily.   BUDESONIDE-FORMOTEROL (SYMBICORT) 160-4.5 MCG/ACT INHALER    Inhale 2 puffs into the lungs 2 (two) times daily.   CHOLECALCIFEROL (VITAMIN D3) 50000 UNITS CAPS    Take 50,000 Units by mouth every 7 (seven) days.   CITALOPRAM (CELEXA) 40 MG TABLET    Take 40 mg by mouth daily.   CLONAZEPAM (KLONOPIN) 2 MG TABLET    Take 1 mg by mouth 2 (two) times daily as needed. Take three tablets by mouth every evening as needed for severe anxiety   CRANBERRY 250 MG CAPS    Take 1 capsule by mouth 2 (two) times daily.   FERROUS SULFATE 325 (65 FE) MG TABLET    Take 325 mg by mouth 3 (three) times daily with meals.   FLUTICASONE (FLONASE) 50 MCG/ACT NASAL SPRAY    Place 2 sprays into both nostrils daily.   GABAPENTIN (NEURONTIN) 100 MG CAPSULE    Take 100 mg by mouth 2 (two) times daily.   HYDROCODONE-ACETAMINOPHEN (NORCO/VICODIN) 5-325 MG PER TABLET  Take 1 tablet by mouth every 4 (four) hours as needed for moderate pain.   HYDROXYZINE (ATARAX/VISTARIL) 25 MG TABLET    Take 1 tablet (25 mg total) by mouth every 8 (eight) hours as needed.   INSULIN ASPART (NOVOLOG) 100 UNIT/ML INJECTION    Inject 5 Units into the skin 3 (three) times daily before meals. For cbg >=150   INSULIN DETEMIR (LEVEMIR) 100 UNIT/ML INJECTION    Inject 0.4 mLs (40 Units total) into the skin daily.   IPRATROPIUM-ALBUTEROL (DUONEB) 0.5-2.5 (3) MG/3ML SOLN    Take 3 mLs by nebulization 2 (two) times daily.   ISOSORBIDE MONONITRATE (IMDUR) 30 MG 24 HR TABLET    Take 1 tablet (30 mg total) by mouth daily.   LINAGLIPTIN (TRADJENTA) 5 MG TABS TABLET    Take 1 tablet (5 mg total) by mouth daily.   METOLAZONE (ZAROXOLYN) 2.5 MG TABLET    Take 2.5 mg by mouth 3 (three) times a week. Monday Wednesday Friday   OLANZAPINE (ZYPREXA) 10  MG TABLET    Take 15 mg by mouth daily.    PANTOPRAZOLE (PROTONIX) 40 MG TABLET    Take 1 tablet (40 mg total) by mouth daily.   POLYETHYLENE GLYCOL (MIRALAX / GLYCOLAX) PACKET    Take 17 g by mouth daily.   POTASSIUM CHLORIDE SA (K-DUR,KLOR-CON) 20 MEQ TABLET    Take 20 mEq by mouth daily.   PREDNISONE (DELTASONE) 10 MG TABLET    Take 0.5 tablets (5 mg total) by mouth daily with breakfast.   PRIMIDONE (MYSOLINE) 250 MG TABLET    Take 250 mg by mouth 2 (two) times daily.   SENNOSIDES-DOCUSATE SODIUM (SENOKOT-S) 8.6-50 MG TABLET    Take 2 tablets by mouth 2 (two) times daily.    SPIRONOLACTONE (ALDACTONE) 25 MG TABLET    Take 2 tablets (50 mg total) by mouth 2 (two) times daily.   TAMSULOSIN (FLOMAX) 0.4 MG CAPS CAPSULE    Take 0.4 mg by mouth daily.   TIOTROPIUM (SPIRIVA) 18 MCG INHALATION CAPSULE    Place 18 mcg into inhaler and inhale daily.   TRAMADOL (ULTRAM) 50 MG TABLET    Take 1 tablet by mouth every 12 hours as needed for pain.   ZOLPIDEM (AMBIEN) 10 MG TABLET    Take one tablet by mouth every night at bedtime for insomnia  Modified Medications   Modified Medication Previous Medication   TORSEMIDE (DEMADEX) 20 MG TABLET torsemide (DEMADEX) 20 MG tablet      Take 60 mg by mouth 3 (three) times daily.    Take 3 tablets (60 mg total) by mouth 2 (two) times daily.  Discontinued Medications   No medications on file    SIGNIFICANT DIAGNOSTIC EXAMS  07-26-13: chest x-ray: minimal bibasilar pneumonitis with superimposed mild congestive changes.   08-04-13: kub: bowel gas pattern could be secondary to partial bowel obstruction versus moderate to severe ileus. No abnormal calcifications in the region of the abdomen   08-10-13: ct of pelvis: 4.1 x 2.4 cm left perineal/scrotal abscess. Associated foci of soft tissue gas, correlate for recent intervention.  Additional 4.3 x 2.3 cm left gluteal fluid collection, suspicious  for early/developing abscess.  Associated mild subcutaneous stranding in  the left gluteal region,  likely reflecting cellulitis.  Mildly thick-walled bladder, correlate for cystitis.  11-09-13: chest x-ray: consistent with chronic chf; findings appear similar to previous studies. There is a question of slight progression of chf.   11-24-13: TEE: Left ventricle: The cavity  size was mildly dilated. Systolic function was normal. The estimated ejection fraction was in the range of 55% to 60%. Wall motion was normal; there were no regional wall motion abnormalities.Doppler parameters are consistent with abnormal left ventricular relaxation (grade 1 diastolic dysfunction).  12-08-13: chest x-ray: minimal cardiomegaly without change with minimal pulmonary vascular congestion without change. No pleural effusion. Patchy bilateral atlectasis  or pneumonia appears new.    12-12-13: renal ultrasound: 1. No renal hydronephrosis, renal calculus or renal mass. 2. Increased renal cortical echogenicity involving both kidneys diffusely consistent with medical renal disease. 3. Pre-void urinary bladder volume 59 ml; no urinary bladder mass.   12-20-13: ekg: sinus tachycardia with pvc's   01-10-14: chest x-ray: 1. Resolution of previously noted pneumonia lower lungs. 2. Mild cardiomegaly with borderline pulmonary vasculature.      LAS REVIEWED:   07-10-13: glucose 207; bun 18; creat 1.0; k+ 3.8; na++ 137  07-24-13: glucose 296; bun 24; creat 1.4; k+3.6; na++ 134 07-26-13: wbc 15.5; hgb 9.8; hct 34.1; mcv 85; plt 254; glucose 229; bun 25; creat 1.2; k+3.7;na++ 133 07-31-13: wbc 13.0; hgb 9.2; hct 32.8; mcv 85; plt 267; glucose 136;bun 26; creat 1.0; k+3.9; na++ 137; liver normal albumin 2.7 hgb a1c 7.7  08-02-13: glucose 97; bun 30;creat 1.2; k+4.0; na++ 140;  08-03-13: wbc 26.2; hgb 10.4; hct 35.9; mcv 85.1 plt 289; glucose 158; bun 29;creat 1.2;k+3.9; na++138  08-09-13: wbc 18.3; hgb 10.3; hct 37.6; mcv 86.2 plt 318  08-21-13: wbc 8.8; hgb 10.8; hct 38.3; mcv 85.1;plt 316; glucose 106;  bun 13; creat 0.9; k+3.9; na++138 10-23-13: BNP: 17 10-27-13: glucose 140; bun 31; creat 1.1; k+4.2; na++140  11-06-13: glucose 98; bun 83; creat 1.9; k+4.7; na++133 11-10-13: glucose 141; bun 79; creat 1.8; k+4.8; na++135; BNP 15 11-13-13: liver normal albumin 4.0; pre-albumin 40.19   11-17-13: glucose 84; bun 87; creat 1.6; k+4.4; na++141  12-06-13: glucose 125; bun 73; creat 2.3; k+4.3; na++135  12-11-13: wbc 10.5; hgb 10.6; hct 35.7; mcv 97.5 plt 236; glucose 95; bun 76; creat 1.7; k+4.7; na++138; liver normal albumin 3.9; mag 1.9; phos 4.6  12-25-13: glucose 113; bun 60; creat 1.3; k+4.4; na++139  12-27-13: wbc 11.2; hgb 0.9; hct 32.8; mcv 90; plt 211;  glucose 115; bun 48; creat 1.29; k+4.3; na++139; liver normal albumin 4.3; iron 92; tibc 324  phos 3.4; pth 97; vit d 12.8; hgb a1c 5.9 01-10-14: wbc 12.3; hgb 10.6; hct 33.7; mcv 96; plt 223; glucose 168; bun 33; creat 1.32; k+4.6; na++139 01-16-14: wbc 9.1; hgb 11.0; hct 38.9 ;mcv 103.5; plt 217; glucose 122; bun 28; creat 1.0; k+3.9; na++141 mag 2.7; folic acid 9.15; vitamin b12: 226  02-15-14:      .Review of Systems  Constitutional: has weakness and fatigue  HENT: negative for congestion      Respiratory: negative for cough .no   shortness of breath and wheezing Cardiovascular: Negative for chest pain, palpitations edema  Gastrointestinal: Negative for heartburn, abdominal pain and constipation.  Musculoskeletal: negative for pint pain . Negative for myalgias.  Skin: negative   Psychiatric/Behavioral: The patient is not nervous/anxious and does not have insomnia.         Physical Exam  Constitutional: He is oriented to person, place, and time. He appears well-developed and well-nourished. No distress.  obese  Neck: Neck supple. No JVD present. No thyromegaly present.  Cardiovascular: Normal rate, regular rhythm and intact distal pulses.   Respiratory: Effort normal breath sounds diminished. No respiratory distress.no wheezes  GI:  Soft. Bowel sounds are normal. He exhibits no distension. There is no tenderness.  Musculoskeletal: Normal range of motion. Has 2+-3+  edema present .   Neurological: He is alert and oriented to person, place, and time.  Skin: Skin is warm. He is not diaphoretic.  Psychiatric: He has a normal mood and affect.       ASSESSMENT/ PLAN:  1.  Diastolic hear failure: is presently without change in status; will continue daily weights; is off fluid restrictions; aldactone 50 mg twice daily; demadex 60 mg three times daily and zaroxolyn 2.5 mg three times daily will monitor  2. end stage copd: he is without change in status;  Will continue prednisone 5 mg daily; prednisone 5 mg daily; symbicort 160/4.5 mcg 2 puffs twice daily; albuterol 2 puffs every 6 hours as needed; duoneb twice daily flonase daily and will monitor his status.   3. Diabetes: is presently stable will continue novolog 5 units prior to meals for cbg >=150; levemir 40 units nightly and tradjenta 5 mg daily   4. Hypertension: is stable; will continue asa 81 mg daily and imdur 30 mg daily   5. Essential tremor: is stable will continue primidone 250 mg twice daily   6. Anemia: will lower his iron to one time daily   7. Gout: no recent flares will continue allopurinol 300 mg daily   8. Gerd: will continue protonix 40 mg daily  9. Constipation: will continue senna s 2 tabs twice daily and miralax daily   10. Depression with anxiety: will continue his celexa 40 mg daily with zyprexa 15 mg daily; will continue klonopin 1 mg twice daily as needed   11. BPH: will continue flomax daily  12. CKD stage IV: no change in his renal function he is tolerating his medications will continue to monitor his status.    His most form has been filled out.     Time spent with patient 50 minutes.     Synthia Innocenteborah Keyasha Miah NP [facility]  Contact [phone] Monday through Friday 8am- 5pm  After hours call [phone]

## 2014-04-10 ENCOUNTER — Encounter: Payer: Self-pay | Admitting: Nurse Practitioner

## 2014-04-10 ENCOUNTER — Ambulatory Visit (INDEPENDENT_AMBULATORY_CARE_PROVIDER_SITE_OTHER): Admitting: Nurse Practitioner

## 2014-04-10 VITALS — BP 120/69 | HR 85 | Ht 71.0 in | Wt 280.9 lb

## 2014-04-10 DIAGNOSIS — R0602 Shortness of breath: Secondary | ICD-10-CM | POA: Diagnosis not present

## 2014-04-10 DIAGNOSIS — I4891 Unspecified atrial fibrillation: Secondary | ICD-10-CM | POA: Insufficient documentation

## 2014-04-10 NOTE — Progress Notes (Signed)
Patient Name: Bill [name] Date of Encounter: 04/10/2014  Primary Care Provider:  Oneal Grout, MD Primary Cardiologist:  Judie Petit. Kirke Corin, MD   Patient Profile  68 y/o male who was recently admitted with inferior ST elevation and acute renal failure, who presents for follow-up.  Problem List   Past Medical History  Diagnosis Date  . COPD (chronic obstructive pulmonary disease)   . Diabetes mellitus without complication   . Hyperlipidemia   . Depression   . Chronic combined systolic and diastolic CHF (congestive heart failure)     a. 03/2014 Echo: EF 40-45%, mod conc LVH, mild Ao sclerosis.  . Gout   . OSA (obstructive sleep apnea)   . Pneumonia 07/26/2013  . Chronic respiratory failure 06/19/2013  . GERD (gastroesophageal reflux disease) 06/19/2013  . DM type 2 with diabetic peripheral neuropathy 06/19/2013  . BPH (benign prostatic hyperplasia) 06/19/2013  . AKI (acute kidney injury) 07/27/2013  . Urinary retention 08/11/2013  . Sepsis 07/27/2013  . Physical deconditioning 06/19/2013  . Perineal abscess 08/01/2013  . Depression, controlled 06/19/2013  . Anemia of chronic disease 07/31/2013  . Afib/flutter     a. Dx [facility] 03/2014;  b. CHA2DS2VASc = 4-->Eliquis 5mg  BID.  Marland Kitchen Abnormal ECG     a. 02/2014 Inferior ST elevation-->cath: nonobstructive CAD.  Marland Kitchen Syncope and collapse   . Asthma    Past Surgical History  Procedure Laterality Date  . Left eye cataract removal    . Rhinoplasty      Allergies  Allergies  Allergen Reactions  . Penicillins Other (See Comments)    unknown    HPI  68 y/o male with the above complex problem list.  At baseline, he is fairly debilitated.  He lives in a skilled nsg facility and is currently wheelchair bound.  He has chronic resp failure and is on home O2.  On 7/31, he presented to [facility] 2/2 dyspnea and was found to have inferior ST elevation with hypotension.  A code STEMI was activated and he underwent cardiac catheterization revealing  nonobstructive CAD.  Pt only had one troponin checked during admission and it was normal.  Post-cath, he was found to have acute renal failure with a creatinine of 1.8.  He was on high dose diuretics as an outpt in the setting of chronic lower ext edema and these were held.  He was treated with IV and subsequently oral fluids with improvement in renal fxn.  Echo showed an EF of 40-45%, though imaging was felt to be poor.  Patient did not have any acute issues related to volume overload during admission.  Though he initially had AMS, this resolved and he was discharged back to SNF on 8/4.  Prior to d/c he was placed on eliquis 5mg  bid 2/2 new finding of afib.  Our recommendation was to d/c on torsemide 20mg  bid only, however it appears that prior home doses of metolazone and spironolactone were also resumed at discharge.  Pt says that he's been doing well since discharge.  He remains in his wheelchair for much of the day.  He continues to wear O2 and has not had any significant dyspnea.  His volume status has been stable. He denies chest pain, palpitations, dyspnea, pnd, orthopnea, n, v, dizziness, syncope, edema, weight gain, or early satiety. He had blood work yesterday.  Home Medications  Prior to Admission medications   Medication Sig Start Date End Date Taking? Authorizing Provider  albuterol (PROVENTIL HFA;VENTOLIN HFA) 108 (90 BASE) MCG/ACT inhaler  Inhale 2 puffs into the lungs every 6 (six) hours as needed for wheezing or shortness of breath.    Yes Historical Provider, MD  allopurinol (ZYLOPRIM) 300 MG tablet Take 1 tablet (300 mg total) by mouth daily. 12/11/13  Yes Sharee Holster, NP  apixaban (ELIQUIS) 5 MG TABS tablet Take 5 mg by mouth 2 (two) times daily.   Yes Historical Provider, MD  aspirin 81 MG tablet Take 81 mg by mouth daily.   Yes Historical Provider, MD  budesonide-formoterol (SYMBICORT) 160-4.5 MCG/ACT inhaler Inhale 2 puffs into the lungs 2 (two) times daily.   Yes Historical  Provider, MD  citalopram (CELEXA) 40 MG tablet Take 40 mg by mouth daily.   Yes Historical Provider, MD  clonazePAM (KLONOPIN) 2 MG tablet Take 1 mg by mouth 2 (two) times daily as needed. Take three tablets by mouth every evening as needed for severe anxiety 08/07/13  Yes Tiffany L Reed, DO  colchicine 0.6 MG tablet Take 0.6 mg by mouth daily as needed.   Yes Historical Provider, MD  ferrous sulfate 325 (65 FE) MG tablet Take 325 mg by mouth 2 (two) times daily.   Yes Historical Provider, MD  fluticasone (FLONASE) 50 MCG/ACT nasal spray Place 2 sprays into both nostrils daily. 10/22/13  Yes Sharee Holster, NP  gabapentin (NEURONTIN) 100 MG capsule Take 100 mg by mouth 2 (two) times daily.   Yes Historical Provider, MD  HYDROcodone-acetaminophen (NORCO/VICODIN) 5-325 MG per tablet Take 1 tablet by mouth every 4 (four) hours as needed for moderate pain. 02/05/14  Yes Tiffany L Reed, DO  hydrophilic ointment Apply topically as needed for dry skin.   Yes Historical Provider, MD  hydrOXYzine (ATARAX/VISTARIL) 25 MG tablet Take 25 mg by mouth 3 (three) times daily. 12/09/13  Yes Sharee Holster, NP  insulin aspart (NOVOLOG) 100 UNIT/ML injection Inject 5 Units into the skin 3 (three) times daily before meals. For cbg >=150   Yes Historical Provider, MD  insulin detemir (LEVEMIR) 100 UNIT/ML injection Inject 10 Units into the skin daily. 07/28/13  Yes Marinda Elk, MD  ipratropium (ATROVENT) 0.02 % nebulizer solution Take 0.5 mg by nebulization 4 (four) times daily - after meals and at bedtime.    Yes Historical Provider, MD  isosorbide mononitrate (IMDUR) 30 MG 24 hr tablet Take 1 tablet (30 mg total) by mouth daily. 12/28/13  Yes Sharee Holster, NP  linagliptin (TRADJENTA) 5 MG TABS tablet Take 1 tablet (5 mg total) by mouth daily. 12/11/13  Yes Sharee Holster, NP  metolazone (ZAROXOLYN) 2.5 MG tablet Take 2.5 mg by mouth 3 (three) times a week. Monday Wednesday Friday   Yes Historical Provider, MD    metoprolol tartrate (LOPRESSOR) 25 MG tablet Take 12.5 mg by mouth 2 (two) times daily.   Yes Historical Provider, MD  Multiple Vitamins-Minerals (PRESERVISION AREDS PO) Take by mouth daily.   Yes Historical Provider, MD  OLANZapine (ZYPREXA) 10 MG tablet Take 15 mg by mouth daily.    Yes Historical Provider, MD  pantoprazole (PROTONIX) 40 MG tablet Take 40 mg by mouth 2 (two) times daily. 12/11/13  Yes Sharee Holster, NP  polyethylene glycol (MIRALAX / GLYCOLAX) packet Take 17 g by mouth as needed.    Yes Historical Provider, MD  potassium chloride SA (K-DUR,KLOR-CON) 20 MEQ tablet Take 20 mEq by mouth daily.   Yes Historical Provider, MD  predniSONE (DELTASONE) 10 MG tablet Take 0.5 tablets (5 mg total) by mouth  daily with breakfast. 12/11/13  Yes Sharee Holster, NP  primidone (MYSOLINE) 250 MG tablet Take 250 mg by mouth 2 (two) times daily.   Yes Historical Provider, MD  saccharomyces boulardii (FLORASTOR) 250 MG capsule Take 250 mg by mouth 2 (two) times daily.   Yes Historical Provider, MD  sennosides-docusate sodium (SENOKOT-S) 8.6-50 MG tablet Take 2 tablets by mouth 2 (two) times daily.    Yes Historical Provider, MD  spironolactone (ALDACTONE) 50 MG tablet Take 50 mg by mouth 2 (two) times daily.   Yes Historical Provider, MD  tamsulosin (FLOMAX) 0.4 MG CAPS capsule Take 0.4 mg by mouth daily.   Yes Historical Provider, MD  tiotropium (SPIRIVA) 18 MCG inhalation capsule Place 18 mcg into inhaler and inhale daily.   Yes Historical Provider, MD  torsemide (DEMADEX) 20 MG tablet Take 20 mg by mouth 2 (two) times daily.  12/09/13  Yes Sharee Holster, NP  traMADol (ULTRAM) 50 MG tablet Take 1 tablet by mouth every 12 hours as needed for pain. 06/19/13  Yes Mahima Glade Lloyd, MD  zolpidem (AMBIEN) 10 MG tablet Take one tablet by mouth every night at bedtime for insomnia 02/06/14  Yes Kimber Relic, MD    Review of Systems  Overall doing well.  Has chronic dyspnea - uses O2 24hrs/day.  He denies  chest pain, palpitations, dyspnea, pnd, orthopnea, n, v, dizziness, syncope, edema, weight gain, or early satiety.  His activity is limited to that which he can do from a wheelchair.  All other systems reviewed and are otherwise negative except as noted above.  Physical Exam  Blood pressure 120/69, pulse 85, height 5\' 11"  (1.803 m), weight 280 lb 14 oz (127.404 kg).  General: Pleasant, NAD.  In wheelchair. Psych: Normal affect. Neuro: Alert and oriented X 3. Moves all extremities spontaneously. HEENT: Normal  Neck: Supple without bruits.  Difficult to assess jvp 2/2 girth. Lungs:  Resp regular and unlabored, CTA. Heart: RRR no s3, s4, or murmurs. Abdomen: Soft, non-tender, non-distended, BS + x 4.  Extremities: No clubbing, cyanosis or edema. DP/PT/Radials 2+ and equal bilaterally.  Accessory Clinical Findings  ECG - rsr, 85, rbbb, lad, lafb.  Assessment & Plan  1.  Chronic combined syst/diast chf:  EF 40-45% (poor visualization)Volume status appears to be stable today and he reports that his breathing is at baseline.  HR/BP stable.  He is in sinus rhythm today.  He was recently admitted with altered mental status and acute kidney injury in the setting of dehydration.  We had hoped to slowly resume his prior home doses of diuretics however he was discharged home on torsemide 20mg  bid, metolazone 2.5mg  3x/wk, and spironolactone 50mg  bid.  He says that he had blood work yesterday but doesn't know what they were checking for.  If he has not already had a bmet, he should have one.  If creat is up, I would d/c spironolactone and metolazone.  If however, creat is stable, continue current regimen, as he appears to be tolerating and volume status is stable.  Cont BB.  He is not on an ACEI orARB 2/2 renal failure while he was hospitalized.  2.  Recent Inferior ST elevation:  Etiology not clear. Non-obstructive CAD by cath.  Troponin was only checked once and was normal.  No chest pain. Cont bb. D/C  aspirin since he is on eliquis.  3.  PAF:  In sinus today.  Cont bb/eliquis.  If not drawn yesterday, he should have a CBC  to ensure that he is tolerating oral anticoagulation.  4.  Chronic resp failure:  On home O2, inhalers, and steroids per IM.  5.  Acute renal failure:  While hospitalized.  Resolved with IVF and holding diuretics.  See discussion under #1.  6.  Dispo:  F/U Dr. Kirke CorinArida in 2-3 mos or sooner if necessary.  He will need close outpt monitoring of renal fxn given multiple diuretics at high doses.   Nicolasa Duckinghristopher Logann Whitebread, NP 04/10/2014, 3:03 PM

## 2014-04-10 NOTE — Patient Instructions (Signed)
Your physician recommends that you schedule a follow-up appointment in:  2 months with Dr. Arida   Your physician recommends that you continue on your current medications as directed. Please refer to the Current Medication list given to you today.   

## 2014-04-12 ENCOUNTER — Encounter: Payer: Self-pay | Admitting: Internal Medicine

## 2014-04-12 ENCOUNTER — Non-Acute Institutional Stay (SKILLED_NURSING_FACILITY): Payer: Medicare Other | Admitting: Internal Medicine

## 2014-04-12 DIAGNOSIS — I5032 Chronic diastolic (congestive) heart failure: Secondary | ICD-10-CM

## 2014-04-12 DIAGNOSIS — K5900 Constipation, unspecified: Secondary | ICD-10-CM

## 2014-04-12 DIAGNOSIS — I214 Non-ST elevation (NSTEMI) myocardial infarction: Secondary | ICD-10-CM

## 2014-04-12 DIAGNOSIS — I4891 Unspecified atrial fibrillation: Secondary | ICD-10-CM

## 2014-04-12 DIAGNOSIS — I509 Heart failure, unspecified: Secondary | ICD-10-CM

## 2014-04-12 DIAGNOSIS — K219 Gastro-esophageal reflux disease without esophagitis: Secondary | ICD-10-CM

## 2014-04-12 DIAGNOSIS — E1149 Type 2 diabetes mellitus with other diabetic neurological complication: Secondary | ICD-10-CM

## 2014-04-12 DIAGNOSIS — F329 Major depressive disorder, single episode, unspecified: Secondary | ICD-10-CM

## 2014-04-12 DIAGNOSIS — F3289 Other specified depressive episodes: Secondary | ICD-10-CM

## 2014-04-12 DIAGNOSIS — I150 Renovascular hypertension: Secondary | ICD-10-CM

## 2014-04-12 DIAGNOSIS — E1142 Type 2 diabetes mellitus with diabetic polyneuropathy: Secondary | ICD-10-CM

## 2014-04-12 DIAGNOSIS — I482 Chronic atrial fibrillation, unspecified: Secondary | ICD-10-CM

## 2014-04-12 DIAGNOSIS — N289 Disorder of kidney and ureter, unspecified: Secondary | ICD-10-CM

## 2014-04-12 DIAGNOSIS — F32A Depression, unspecified: Secondary | ICD-10-CM

## 2014-04-12 DIAGNOSIS — J449 Chronic obstructive pulmonary disease, unspecified: Secondary | ICD-10-CM

## 2014-04-12 NOTE — Progress Notes (Signed)
Patient ID: Bill MeekerJesse C Meritt, male   DOB: [birth year], 68 y.o.   MRN: [id number]     [facility]    PCP: Oneal GroutPANDEY, Yosgart Pavey, MD   Allergies  Allergen Reactions  . Penicillins Other (See Comments)    unknown    Chief Complaint: new admission  HPI:  68 y/o long term care resident of the facility was admitted to the hospital with AMS and dehydration. he was noted to have inferior ST elevation and acute renal failure.He underwent cardiac catheterization which ruled out CAD. He also had afib.His kidney function responded well to holding of diuretics and giving iv fluids. He was started on eliquis. He has hx of chronic CHF, chronic respiratory failure, morbid obesity and is wheelchair bound He is seen in his room today. He is on o2 and in no distress.   Review of Systems:  Constitutional: Negative for fever, chills, diaphoresis.  HENT: Negative for congestion Respiratory: Negative for cough, sputum production, breathing is at baseline Cardiovascular: Negative for chest pain, palpitations.  Gastrointestinal: Negative for heartburn, nausea, vomiting, abdominal pain Musculoskeletal: Negative for falls Skin: Negative for itching and rash.  Neurological: positive for weakness    Past Medical History  Diagnosis Date  . COPD (chronic obstructive pulmonary disease)   . Diabetes mellitus without complication   . Hyperlipidemia   . Depression   . Chronic combined systolic and diastolic CHF (congestive heart failure)     a. 03/2014 Echo: EF 40-45%, mod conc LVH, mild Ao sclerosis.  . Gout   . OSA (obstructive sleep apnea)   . Pneumonia 07/26/2013  . Chronic respiratory failure 06/19/2013  . GERD (gastroesophageal reflux disease) 06/19/2013  . DM type 2 with diabetic peripheral neuropathy 06/19/2013  . BPH (benign prostatic hyperplasia) 06/19/2013  . AKI (acute kidney injury) 07/27/2013  . Urinary retention 08/11/2013  . Sepsis 07/27/2013  . Physical  deconditioning 06/19/2013  . Perineal abscess 08/01/2013  . Depression, controlled 06/19/2013  . Anemia of chronic disease 07/31/2013  . Afib/flutter     a. Dx [facility] 03/2014;  b. CHA2DS2VASc = 4-->Eliquis 5mg  BID.  Marland Kitchen. Abnormal ECG     a. 02/2014 Inferior ST elevation-->cath: nonobstructive CAD.  Marland Kitchen. Syncope and collapse   . Asthma    Past Surgical History  Procedure Laterality Date  . Left eye cataract removal    . Rhinoplasty     Social History:   reports that he quit smoking about 7 years ago. His smoking use included Cigarettes. He has a 75 pack-year smoking history. He has never used smokeless tobacco. He reports that he does not drink alcohol or use illicit drugs.  Family History  Problem Relation Age of Onset  . Diabetes Mother   . Heart disease Mother   . Hypertension Mother     Medications: Patient's Medications  New Prescriptions   No medications on file  Previous Medications   ALBUTEROL (PROVENTIL HFA;VENTOLIN HFA) 108 (90 BASE) MCG/ACT INHALER    Inhale 2 puffs into the lungs every 6 (six) hours as needed for wheezing or shortness of breath.    ALLOPURINOL (ZYLOPRIM) 300 MG TABLET    Take 1 tablet (300 mg total) by mouth daily.   APIXABAN (ELIQUIS) 5 MG TABS TABLET    Take 5 mg by mouth 2 (two) times daily.   ASPIRIN 81 MG TABLET    Take 81 mg by mouth daily.   BUDESONIDE-FORMOTEROL (SYMBICORT) 160-4.5 MCG/ACT INHALER    Inhale 2 puffs into  the lungs 2 (two) times daily.   CITALOPRAM (CELEXA) 40 MG TABLET    Take 40 mg by mouth daily.   CLONAZEPAM (KLONOPIN) 2 MG TABLET    Take 1 mg by mouth 2 (two) times daily as needed. Take three tablets by mouth every evening as needed for severe anxiety   COLCHICINE 0.6 MG TABLET    Take 0.6 mg by mouth daily as needed.   FERROUS SULFATE 325 (65 FE) MG TABLET    Take 325 mg by mouth 2 (two) times daily.   FLUTICASONE (FLONASE) 50 MCG/ACT NASAL SPRAY    Place 2 sprays into both nostrils daily.   GABAPENTIN (NEURONTIN) 100 MG CAPSULE     Take 100 mg by mouth 2 (two) times daily.   HYDROCODONE-ACETAMINOPHEN (NORCO/VICODIN) 5-325 MG PER TABLET    Take 1 tablet by mouth every 4 (four) hours as needed for moderate pain.   HYDROPHILIC OINTMENT    Apply topically as needed for dry skin.   HYDROXYZINE (ATARAX/VISTARIL) 25 MG TABLET    Take 25 mg by mouth 3 (three) times daily.   INSULIN ASPART (NOVOLOG) 100 UNIT/ML INJECTION    Inject 5 Units into the skin 3 (three) times daily before meals. For cbg >=150   INSULIN DETEMIR (LEVEMIR) 100 UNIT/ML INJECTION    Inject 10 Units into the skin daily.   IPRATROPIUM (ATROVENT) 0.02 % NEBULIZER SOLUTION    Take 0.5 mg by nebulization 4 (four) times daily - after meals and at bedtime.    ISOSORBIDE MONONITRATE (IMDUR) 30 MG 24 HR TABLET    Take 1 tablet (30 mg total) by mouth daily.   LINAGLIPTIN (TRADJENTA) 5 MG TABS TABLET    Take 1 tablet (5 mg total) by mouth daily.   METOLAZONE (ZAROXOLYN) 2.5 MG TABLET    Take 2.5 mg by mouth 3 (three) times a week. Monday Wednesday Friday   METOPROLOL TARTRATE (LOPRESSOR) 25 MG TABLET    Take 12.5 mg by mouth 2 (two) times daily.   MULTIPLE VITAMINS-MINERALS (PRESERVISION AREDS PO)    Take by mouth daily.   OLANZAPINE (ZYPREXA) 10 MG TABLET    Take 15 mg by mouth daily.    PANTOPRAZOLE (PROTONIX) 40 MG TABLET    Take 40 mg by mouth 2 (two) times daily.   POLYETHYLENE GLYCOL (MIRALAX / GLYCOLAX) PACKET    Take 17 g by mouth as needed.    POTASSIUM CHLORIDE SA (K-DUR,KLOR-CON) 20 MEQ TABLET    Take 20 mEq by mouth daily.   PREDNISONE (DELTASONE) 10 MG TABLET    Take 0.5 tablets (5 mg total) by mouth daily with breakfast.   PRIMIDONE (MYSOLINE) 250 MG TABLET    Take 250 mg by mouth 2 (two) times daily.   SACCHAROMYCES BOULARDII (FLORASTOR) 250 MG CAPSULE    Take 250 mg by mouth 2 (two) times daily.   SENNOSIDES-DOCUSATE SODIUM (SENOKOT-S) 8.6-50 MG TABLET    Take 2 tablets by mouth 2 (two) times daily.    SPIRONOLACTONE (ALDACTONE) 50 MG TABLET    Take 50 mg  by mouth 2 (two) times daily.   TAMSULOSIN (FLOMAX) 0.4 MG CAPS CAPSULE    Take 0.4 mg by mouth daily.   TIOTROPIUM (SPIRIVA) 18 MCG INHALATION CAPSULE    Place 18 mcg into inhaler and inhale daily.   TORSEMIDE (DEMADEX) 20 MG TABLET    Take 20 mg by mouth 2 (two) times daily.    TRAMADOL (ULTRAM) 50 MG TABLET    Take  1 tablet by mouth every 12 hours as needed for pain.   ZOLPIDEM (AMBIEN) 10 MG TABLET    Take one tablet by mouth every night at bedtime for insomnia  Modified Medications   No medications on file  Discontinued Medications   No medications on file     Physical Exam: Filed Vitals:   04/12/14 1851  BP: 113/69  Pulse: 67  Temp: 98 F (36.7 C)  Resp: 20  SpO2: 95%    General- elderly male in no acute distress Head- atraumatic, normocephalic Eyes-  no pallor, no icterus, no discharge Neck- no lymphadenopathy, no thyromegaly, no jugular vein distension, no carotid bruit Cardiovascular- normal s1,s2, no murmurs/ rubs/ gallops Respiratory- bilateral clear to auscultation, no wheeze, no rhonchi, no crackles, no use of accessory muscles Abdomen- bowel sounds present, soft, non tender Musculoskeletal- able to move all 4 extremities, leg edema 1+, on wheelchair Neurological- no focal deficit Skin- warm and dry Psychiatry- alert and oriented to person, place and time, normal mood and affect   Labs reviewed: Basic Metabolic Panel:  Recent Labs  16/10/96 0315 07/26/13 2002 07/27/13 0430  12/06/13 12/11/13  12/27/13 01/12/14 01/16/14  NA 136 137 137  < > 135* 138  < > 139 139 141  K 3.5 3.8 4.3  < > 3.5 4.7  < > 4.3 4.6 3.9  CL 97 96 98  --   --   --   --   --   --   --   CO2 28 25 24   --   --   --   --   --   --   --   GLUCOSE 161* 226* 435*  --   --   --   --   --   --   --   BUN 18 32* 36*  < > 73* 76*  < > 48* 33* 28*  CREATININE 1.09 1.54* 1.67*  < > 2.3* 1.7*  < > 1.3 1.3 1.0  CALCIUM 8.8 8.8 9.1  --   --   --   --   --   --   --   MG  --   --   --   --   --   1.9  --   --   --   --   PHOS  --   --   --   --   --  4.6  --  3.4  --   --   < > = values in this interval not displayed. Liver Function Tests:  Recent Labs  07/26/13 2002 07/27/13 0430 11/13/13 12/11/13 12/27/13  AST 19 17 10* 12* 16  ALT 22 16 13 17 22   ALKPHOS 131* 121* 105 124 167*  BILITOT 0.7 0.4  --   --   --   PROT 6.7 6.4  --   --   --   ALBUMIN 3.2* 2.8*  --   --   --    No results found for this basename: LIPASE, AMYLASE,  in the last 8760 hours No results found for this basename: AMMONIA,  in the last 8760 hours CBC:  Recent Labs  07/12/13 0315 07/26/13 2000 07/27/13 0430  12/27/13 01/12/14 01/16/14  WBC 9.8 26.8* 23.1*  < > 11.2 12.3 9.1  NEUTROABS 7.7  --  22.3*  --   --   --   --   HGB 8.9* 11.3* 9.9*  < > 10.9* 10.6* 11.0*  HCT 29.5* 36.4*  32.7*  < > 33* 34* 39*  MCV 86.0 84.5 86.1  --   --   --   --   PLT 199 268 234  < > 211 223 217  < > = values in this interval not displayed. Cardiac Enzymes:  Recent Labs  07/12/13 0315 07/27/13 0430  TROPONINI <0.30 <0.30   BNP: No components found with this basename: POCBNP,  CBG:  Recent Labs  07/29/13 0759 07/29/13 1149 08/10/13 1503  GLUCAP 151* 153* 267*    Radiological Exams: 07-26-13: chest x-ray: minimal bibasilar pneumonitis with superimposed mild congestive changes.   08-04-13: kub: bowel gas pattern could be secondary to partial bowel obstruction versus moderate to severe ileus. No abnormal calcifications in the region of the abdomen   08-10-13: ct of pelvis: 4.1 x 2.4 cm left perineal/scrotal abscess. Associated foci of soft tissue gas, correlate for recent intervention.  Additional 4.3 x 2.3 cm left gluteal fluid collection, suspicious   for early/developing abscess.  Associated mild subcutaneous stranding in the left gluteal region,   likely reflecting cellulitis.  Mildly thick-walled bladder, correlate for cystitis.  11-09-13: chest x-ray: consistent with chronic chf; findings appear  similar to previous studies. There is a question of slight progression of chf.   11-24-13: TEE: Left ventricle: The cavity size was mildly dilated. Systolic function was normal. The estimated ejection fraction was in the range of 55% to 60%. Wall motion was normal; there were no regional wall motion abnormalities.Doppler parameters are consistent with abnormal left ventricular relaxation (grade 1 diastolic dysfunction).  12-08-13: chest x-ray: minimal cardiomegaly without change with minimal pulmonary vascular congestion without change. No pleural effusion. Patchy bilateral atlectasis  or pneumonia appears new.    12-12-13: renal ultrasound: 1. No renal hydronephrosis, renal calculus or renal mass. 2. Increased renal cortical echogenicity involving both kidneys diffusely consistent with medical renal disease. 3. Pre-void urinary bladder volume 59 ml; no urinary bladder mass.   12-20-13: ekg: sinus tachycardia with pvc's   01-10-14: chest x-ray: 1. Resolution of previously noted pneumonia lower lungs. 2. Mild cardiomegaly with borderline pulmonary vasculature.   03-30-14: ct of head: chronic changes without acute abnormality   03-30-14: cardiac cath: ef 40%; global hypokinesis more focal in basal inferior wall.   03-31-14: 2-d echo: EF 40-45%; moderate left ventricular hypertrophy.   Assessment/Plan  NSTEMI Unclear reason. Continue b blocker and eliquis with imdur  afib Rate controlled. Continue b blocker and eliquis  Renal impairment From diuretics and recent NSTEMI causing hypoperfusion. Monitor renal function  chf Monitor renal function and weight.continue aldactone 50 mg twice daily, demadex 20 mg twice daily and zaroxolyn 2.5 mg three times weekly. Continue o2  copd On continuous o2, prednsione chronically, symbicort and prn albuterol. Monitor clinically  HTN Continue imdur and diuretics with b blocker. Monitor renal function  Dm Monitor cbg and continue levemir with  tradjenta  Genella Rife continue protonix 40 mg daily  Constipation continue senna s 2 tabs twice daily and miralax daily   Depression with anxiety continue his celexa 40 mg daily with zyprexa 15 mg daily and prn klonopin   Family/ staff Communication: reviewed care plan with patient and nursing supervisor   Goals of care: long term care   Labs/tests ordered: cbc, cmp    Oneal Grout, MD  [facility] [phone] (Monday-Friday 8 am - 5 pm) [phone] (afterhours)

## 2014-05-01 ENCOUNTER — Ambulatory Visit: Payer: Self-pay | Admitting: Internal Medicine

## 2014-05-08 ENCOUNTER — Encounter: Payer: Self-pay | Admitting: Adult Health Nurse Practitioner

## 2014-05-08 ENCOUNTER — Non-Acute Institutional Stay (SKILLED_NURSING_FACILITY): Payer: Medicare Other | Admitting: Adult Health Nurse Practitioner

## 2014-05-08 DIAGNOSIS — I509 Heart failure, unspecified: Secondary | ICD-10-CM

## 2014-05-08 DIAGNOSIS — G252 Other specified forms of tremor: Secondary | ICD-10-CM

## 2014-05-08 DIAGNOSIS — R0902 Hypoxemia: Secondary | ICD-10-CM

## 2014-05-08 DIAGNOSIS — J449 Chronic obstructive pulmonary disease, unspecified: Secondary | ICD-10-CM

## 2014-05-08 DIAGNOSIS — G250 Essential tremor: Secondary | ICD-10-CM

## 2014-05-08 DIAGNOSIS — I4891 Unspecified atrial fibrillation: Secondary | ICD-10-CM

## 2014-05-08 DIAGNOSIS — I150 Renovascular hypertension: Secondary | ICD-10-CM

## 2014-05-08 DIAGNOSIS — E1142 Type 2 diabetes mellitus with diabetic polyneuropathy: Secondary | ICD-10-CM

## 2014-05-08 DIAGNOSIS — N184 Chronic kidney disease, stage 4 (severe): Secondary | ICD-10-CM

## 2014-05-08 DIAGNOSIS — I482 Chronic atrial fibrillation, unspecified: Secondary | ICD-10-CM

## 2014-05-08 DIAGNOSIS — J9611 Chronic respiratory failure with hypoxia: Secondary | ICD-10-CM

## 2014-05-08 DIAGNOSIS — I5032 Chronic diastolic (congestive) heart failure: Secondary | ICD-10-CM

## 2014-05-08 DIAGNOSIS — J9610 Chronic respiratory failure, unspecified whether with hypoxia or hypercapnia: Secondary | ICD-10-CM

## 2014-05-08 DIAGNOSIS — E1149 Type 2 diabetes mellitus with other diabetic neurological complication: Secondary | ICD-10-CM

## 2014-05-08 NOTE — Progress Notes (Signed)
PCP: Oneal Grout, MD  Code Status: DNR  Allergies  Allergen Reactions  . Penicillins Other (See Comments)    unknown    Chief Complaint: Medical Management of Chronic Health Conditions  HPI:  Mr. Jaworski is a pleasant 68 yr old male who is being seen for medical management of chronic health conditions. He has an extensive medial history, DM, COPD, CHF, and HLD.Staff reports that the patient slid off the bed today onto the floor. He denies injury or pain from the fall. Pt states he could not get his feet under him and slid down to the floor. Pt endorses having increased shortness of breath this am. He states it started this am and has not improved. He has had a prn nebulizer this am with little relief. He does feel his inhalers help some, these are scheduled. He denies chest pain, difficulty breathing, pain, or leg swelling. Pt recently started with Hospice care, but states he has not interacted with them very often. Overall he feels he is doing okay beside being short of breath.       Review of Systems:  Constitutional: Positive weakness/fatigue; Negative for fever, chills, weight loss,  and diaphoresis.  HENT: Negative for congestion, hearing loss and sore throat.   Eyes: Negative for eye pain, blurred vision, double vision and discharge.  Respiratory: Positive shortness of breath. Negative for cough, sputum production,and wheezing.   Cardiovascular: Negative for chest pain, palpitations, orthopnea and leg swelling.  Gastrointestinal: Negative for heartburn, nausea, vomiting, abdominal pain, diarrhea and constipation.  Genitourinary: Negative for dysuria, urgency, frequency, hematuria and flank pain.  Musculoskeletal: Positive for joint/back pain, falls and myalgias.   Skin: Negative for itching and rash.  Neurological: Negative for dizziness, tingling, focal weakness and headaches.  Psychiatric/Behavioral: Negative for depression and memory loss. The patient is not  nervous/anxious.     Past Medical History  Diagnosis Date  . COPD (chronic obstructive pulmonary disease)   . Diabetes mellitus without complication   . Hyperlipidemia   . Depression   . Chronic combined systolic and diastolic CHF (congestive heart failure)     a. 03/2014 Echo: EF 40-45%, mod conc LVH, mild Ao sclerosis.  . Gout   . OSA (obstructive sleep apnea)   . Pneumonia 07/26/2013  . Chronic respiratory failure 06/19/2013  . GERD (gastroesophageal reflux disease) 06/19/2013  . DM type 2 with diabetic peripheral neuropathy 06/19/2013  . BPH (benign prostatic hyperplasia) 06/19/2013  . AKI (acute kidney injury) 07/27/2013  . Urinary retention 08/11/2013  . Sepsis 07/27/2013  . Physical deconditioning 06/19/2013  . Perineal abscess 08/01/2013  . Depression, controlled 06/19/2013  . Anemia of chronic disease 07/31/2013  . Afib/flutter     a. Dx [facility] 03/2014;  b. CHA2DS2VASc = 4-->Eliquis  BID.  Marland Kitchen Abnormal ECG     a. 02/2014 Inferior ST elevation-->cath: nonobstructive CAD.  Marland Kitchen Syncope and collapse   . Asthma    Past Surgical History  Procedure Laterality Date  . Left eye cataract removal    . Rhinoplasty     Social History:   reports that he quit smoking about 7 years ago. His smoking use included Cigarettes. He has a 75 pack-year smoking history. He has never used smokeless tobacco. He reports that he does not drink alcohol or use illicit drugs.  Family History  Problem Relation Age of Onset  . Diabetes Mother   . Heart disease Mother   . Hypertension Mother     Medications:  Medication List       This list is accurate as of: 05/08/14 11:48 AM.  Always use your most recent med list.               albuterol 108 (90 BASE) MCG/ACT inhaler  Commonly known as:  PROVENTIL HFA;VENTOLIN HFA  Inhale 2 puffs into the lungs every 6 (six) hours as needed for wheezing or shortness of breath.     allopurinol 300 MG tablet  Commonly known as:  ZYLOPRIM  Take 1 tablet  (300 mg total) by mouth daily.     aspirin 81 MG tablet  Take 81 mg by mouth daily.     budesonide-formoterol 160-4.5 MCG/ACT inhaler  Commonly known as:  SYMBICORT  Inhale 2 puffs into the lungs 2 (two) times daily.     citalopram 40 MG tablet  Commonly known as:  CELEXA  Take 40 mg by mouth daily.     clonazePAM 2 MG tablet  Commonly known as:  KLONOPIN  Take 1 mg by mouth 2 (two) times daily as needed. Take three tablets by mouth every evening as needed for severe anxiety     colchicine 0.6 MG tablet  Take 0.6 mg by mouth daily as needed.     ELIQUIS 5 MG Tabs tablet  Generic drug:  apixaban  Take 5 mg by mouth 2 (two) times daily.     ferrous sulfate 325 (65 FE) MG tablet  Take 325 mg by mouth 2 (two) times daily.     fluticasone 50 MCG/ACT nasal spray  Commonly known as:  FLONASE  Place 2 sprays into both nostrils daily.     gabapentin 100 MG capsule  Commonly known as:  NEURONTIN  Take 100 mg by mouth 2 (two) times daily.     HYDROcodone-acetaminophen 5-325 MG per tablet  Commonly known as:  NORCO/VICODIN  Take 1 tablet by mouth every 4 (four) hours as needed for moderate pain.     hydrophilic ointment  Apply topically as needed for dry skin.     hydrOXYzine 25 MG tablet  Commonly known as:  ATARAX/VISTARIL  Take 25 mg by mouth 3 (three) times daily.     insulin aspart 100 UNIT/ML injection  Commonly known as:  novoLOG  Inject 5 Units into the skin 3 (three) times daily before meals. For cbg >=150     insulin detemir 100 UNIT/ML injection  Commonly known as:  LEVEMIR  Inject 10 Units into the skin daily.     ipratropium 0.02 % nebulizer solution  Commonly known as:  ATROVENT  Take 0.5 mg by nebulization 4 (four) times daily - after meals and at bedtime.     isosorbide mononitrate 30 MG 24 hr tablet  Commonly known as:  IMDUR  Take 1 tablet (30 mg total) by mouth daily.     linagliptin 5 MG Tabs tablet  Commonly known as:  TRADJENTA  Take 1 tablet  (5 mg total) by mouth daily.     metolazone 2.5 MG tablet  Commonly known as:  ZAROXOLYN  Take 2.5 mg by mouth 3 (three) times a week. Monday Wednesday Friday     metoprolol tartrate 25 MG tablet  Commonly known as:  LOPRESSOR  Take 12.5 mg by mouth 2 (two) times daily.     OLANZapine 10 MG tablet  Commonly known as:  ZYPREXA  Take 15 mg by mouth daily.     pantoprazole 40 MG tablet  Commonly known as:  PROTONIX  Take 40 mg by mouth 2 (two) times daily.     polyethylene glycol packet  Commonly known as:  MIRALAX / GLYCOLAX  Take 17 g by mouth as needed.     potassium chloride SA 20 MEQ tablet  Commonly known as:  K-DUR,KLOR-CON  Take 20 mEq by mouth daily.     predniSONE 10 MG tablet  Commonly known as:  DELTASONE  Take 0.5 tablets (5 mg total) by mouth daily with breakfast.     PRESERVISION AREDS PO  Take by mouth daily.     primidone 250 MG tablet  Commonly known as:  MYSOLINE  Take 250 mg by mouth 2 (two) times daily.     saccharomyces boulardii 250 MG capsule  Commonly known as:  FLORASTOR  Take 250 mg by mouth 2 (two) times daily.     sennosides-docusate sodium 8.6-50 MG tablet  Commonly known as:  SENOKOT-S  Take 2 tablets by mouth 2 (two) times daily.     spironolactone 50 MG tablet  Commonly known as:  ALDACTONE  Take 50 mg by mouth 2 (two) times daily.     tamsulosin 0.4 MG Caps capsule  Commonly known as:  FLOMAX  Take 0.4 mg by mouth daily.     tiotropium 18 MCG inhalation capsule  Commonly known as:  SPIRIVA  Place 18 mcg into inhaler and inhale daily.     torsemide 20 MG tablet  Commonly known as:  DEMADEX  Take 20 mg by mouth 2 (two) times daily.     traMADol 50 MG tablet  Commonly known as:  ULTRAM  Take 1 tablet by mouth every 12 hours as needed for pain.     zolpidem 10 MG tablet  Commonly known as:  AMBIEN  Take one tablet by mouth every night at bedtime for insomnia          Physical Exam:  Filed Vitals:   05/08/14 1139    Weight: 282 lb 12.8 oz (128.277 kg)    General- pleasant male in no acute distress; looks ill this am Head- atraumatic, normocephalic Neck- no lymphadenopathy, no thyromegaly, no jugular vein distension, no carotid bruit Chest- no chest wall deformities, no chest wall tenderness Cardiovascular- normal s1,s2, no murmurs/ rubs/ gallops, non-pitting edema to bilateral lower legs. Respiratory- bilateral clear to auscultation to upper lobes; diminished at the bases, no wheeze, no rhonchi, no crackles, use of accessory muscles Abdomen- bowel sounds present, soft, non tender, no organomegaly, no abdominal bruits, no guarding or rigidity, no CVA tenderness Musculoskeletal- able to move all 4 extremities, steady gait, uses walker assistive device, decreased range of motion, 3/5 strength to lower extremities  Neurological- positive tremor; no focal deficit; CN 2-12 intact Skin- warm and dry; healing skin abrasions to bilateral arms.  Psychiatry- alert and oriented to person, place and time, normal mood and affect    SIGNIFICANT DIAGNOSTIC EXAMS  07-26-13: chest x-ray: minimal bibasilar pneumonitis with superimposed mild congestive changes.   08-04-13: kub: bowel gas pattern could be secondary to partial bowel obstruction versus moderate to severe ileus. No abnormal calcifications in the region of the abdomen   08-10-13: ct of pelvis: 4.1 x 2.4 cm left perineal/scrotal abscess. Associated foci of soft tissue gas, correlate for recent intervention.  Additional 4.3 x 2.3 cm left gluteal fluid collection, suspicious  for early/developing abscess.  Associated mild subcutaneous stranding in the left gluteal region,  likely reflecting cellulitis.  Mildly thick-walled bladder, correlate for cystitis.  11-09-13: chest x-ray: consistent  with chronic chf; findings appear similar to previous studies. There is a question of slight progression of chf.   11-24-13: TEE: Left ventricle: The cavity size was mildly  dilated. Systolic function was normal. The estimated ejection fraction was in the range of 55% to 60%. Wall motion was normal; there were no regional wall motion abnormalities.Doppler parameters are consistent with abnormal left ventricular relaxation (grade 1 diastolic dysfunction).  12-08-13: chest x-ray: minimal cardiomegaly without change with minimal pulmonary vascular congestion without change. No pleural effusion. Patchy bilateral atlectasis  or pneumonia appears new.    12-12-13: renal ultrasound: 1. No renal hydronephrosis, renal calculus or renal mass. 2. Increased renal cortical echogenicity involving both kidneys diffusely consistent with medical renal disease. 3. Pre-void urinary bladder volume 59 ml; no urinary bladder mass.   12-20-13: ekg: sinus tachycardia with pvc's   01-10-14: chest x-ray: 1. Resolution of previously noted pneumonia lower lungs. 2. Mild cardiomegaly with borderline pulmonary vasculature.   03/24/14: chest x-ray: Cardiomegaly with mild-moderate CHF; airspace opacities lower lungs bilaterally consistent with superimposed inflammatory process or pneumonia    LAS REVIEWED:   07-10-13: glucose 207; bun 18; creat 1.0; k+ 3.8; na++ 137  07-24-13: glucose 296; bun 24; creat 1.4; k+3.6; na++ 134 07-26-13: wbc 15.5; hgb 9.8; hct 34.1; mcv 85; plt 254; glucose 229; bun 25; creat 1.2; k+3.7;na++ 133 07-31-13: wbc 13.0; hgb 9.2; hct 32.8; mcv 85; plt 267; glucose 136;bun 26; creat 1.0; k+3.9; na++ 137; liver normal albumin 2.7 hgb a1c 7.7  08-02-13: glucose 97; bun 30;creat 1.2; k+4.0; na++ 140;  08-03-13: wbc 26.2; hgb 10.4; hct 35.9; mcv 85.1 plt 289; glucose 158; bun 29;creat 1.2;k+3.9; na++138  08-09-13: wbc 18.3; hgb 10.3; hct 37.6; mcv 86.2 plt 318  08-21-13: wbc 8.8; hgb 10.8; hct 38.3; mcv 85.1;plt 316; glucose 106; bun 13; creat 0.9; k+3.9; na++138 10-23-13: BNP: 17 10-27-13: glucose 140; bun 31; creat 1.1; k+4.2; na++140  11-06-13: glucose 98; bun 83; creat 1.9; k+4.7;  na++133 11-10-13: glucose 141; bun 79; creat 1.8; k+4.8; na++135; BNP 15 11-13-13: liver normal albumin 4.0; pre-albumin 40.19   11-17-13: glucose 84; bun 87; creat 1.6; k+4.4; na++141  12-06-13: glucose 125; bun 73; creat 2.3; k+4.3; na++135  12-11-13: wbc 10.5; hgb 10.6; hct 35.7; mcv 97.5 plt 236; glucose 95; bun 76; creat 1.7; k+4.7; na++138; liver normal albumin 3.9; mag 1.9; phos 4.6  12-25-13: glucose 113; bun 60; creat 1.3; k+4.4; na++139  12-27-13: wbc 11.2; hgb 0.9; hct 32.8; mcv 90; plt 211;  glucose 115; bun 48; creat 1.29; k+4.3; na++139; liver normal albumin 4.3; iron 92; tibc 324  phos 3.4; pth 97; vit d 12.8; hgb a1c 5.9 01-10-14: wbc 12.3; hgb 10.6; hct 33.7; mcv 96; plt 223; glucose 168; bun 33; creat 1.32; k+4.6; na++139 01-16-14: wbc 9.1; hgb 11.0; hct 38.9 ;mcv 103.5; plt 217; glucose 122; bun 28; creat 1.0; k+3.9; na++141 mag 2.7; folic acid 9.15; vitamin b12: 226  04-09-14: glucose 106; bun 31; creat 0.9; k+3.9; na++139    Assessment/Plan  1.  Diastolic heart failure: no acute change; will continue daily weights; is off fluid restrictions; aldactone 50 mg twice daily; demadex 20 mg three times daily and zaroxolyn 2.5 mg three times a week M/W/F; will monitor  2.  Dyspnea: Increased shortness of breath; pt appears that he doesn't feel well; will obtain stat chest x-ray  3. End Stage COPD: He is having increased shortness of breath; Did ask nurse to give breathing treatment now and scheduled inhalers.  Will continue prednisone 5 mg daily;  symbicort 160/4.5 mcg 2 puffs twice daily; albuterol 2 puffs every 6 hours as needed; duoneb twice daily flonase daily and will monitor his status.   4. Diabetes: Blood sugars range from 200-150s;  will continue novolog 5 units prior to meals for cbg >=150; levemir 10 units nightly and tradjenta 5 mg daily   5. Hypertension: is stable; will continue asa 81 mg daily and imdur 30 mg daily and metoprolol 12.5mg  BID  6. Essential tremor: is stable  will continue primidone 250 mg twice daily   7. Anemia:stable; Iron 325mg  BID  8. Gout: no issues;  will continue allopurinol 300 mg daily   9. GERD: no issues; will continue protonix 40 mg BID  10. Constipation: will continue senna s 2 tabs twice daily and miralax daily   11. Depression with anxiety: will continue his celexa 40 mg daily with zyprexa 15 mg daily; will continue klonopin 1 mg twice daily as needed   12. BPH: will continue flomax 0.4mg  daily  13. CKD stage IV: Renal function stable, creat 0.9, BUN 33; will continue to monitor will cancel his nephrology appointments   Leeanne Mannan, Student-NP   Synthia Innocent NP [facility]  Contact [phone] Monday through Friday 8am- 5pm  After hours call [phone]

## 2014-05-09 ENCOUNTER — Other Ambulatory Visit: Payer: Self-pay | Admitting: *Deleted

## 2014-05-09 MED ORDER — ZOLPIDEM TARTRATE 10 MG PO TABS: ORAL_TABLET | ORAL | Status: AC

## 2014-05-09 MED ORDER — HYDROCODONE-ACETAMINOPHEN 5-325 MG PO TABS: ORAL_TABLET | ORAL | Status: AC

## 2014-05-09 NOTE — Telephone Encounter (Signed)
[facility] 

## 2014-05-23 ENCOUNTER — Encounter: Payer: Self-pay | Admitting: Cardiovascular Disease

## 2014-06-01 ENCOUNTER — Non-Acute Institutional Stay (SKILLED_NURSING_FACILITY): Payer: Medicare Other | Admitting: Adult Health

## 2014-06-01 DIAGNOSIS — M792 Neuralgia and neuritis, unspecified: Secondary | ICD-10-CM

## 2014-06-01 DIAGNOSIS — E1142 Type 2 diabetes mellitus with diabetic polyneuropathy: Secondary | ICD-10-CM

## 2014-06-01 DIAGNOSIS — G250 Essential tremor: Secondary | ICD-10-CM

## 2014-06-01 DIAGNOSIS — J449 Chronic obstructive pulmonary disease, unspecified: Secondary | ICD-10-CM

## 2014-06-01 DIAGNOSIS — I5032 Chronic diastolic (congestive) heart failure: Secondary | ICD-10-CM

## 2014-06-01 DIAGNOSIS — N184 Chronic kidney disease, stage 4 (severe): Secondary | ICD-10-CM

## 2014-06-01 DIAGNOSIS — D638 Anemia in other chronic diseases classified elsewhere: Secondary | ICD-10-CM

## 2014-06-01 DIAGNOSIS — G629 Polyneuropathy, unspecified: Secondary | ICD-10-CM

## 2014-06-06 ENCOUNTER — Non-Acute Institutional Stay (SKILLED_NURSING_FACILITY): Payer: Medicare Other | Admitting: Adult Health

## 2014-06-06 DIAGNOSIS — I5032 Chronic diastolic (congestive) heart failure: Secondary | ICD-10-CM

## 2014-06-06 DIAGNOSIS — J449 Chronic obstructive pulmonary disease, unspecified: Secondary | ICD-10-CM

## 2014-06-11 ENCOUNTER — Ambulatory Visit: Payer: Medicare Other | Admitting: Cardiovascular Disease

## 2014-06-15 ENCOUNTER — Non-Acute Institutional Stay (SKILLED_NURSING_FACILITY): Payer: Medicare Other | Admitting: Adult Health

## 2014-06-15 DIAGNOSIS — I5032 Chronic diastolic (congestive) heart failure: Secondary | ICD-10-CM

## 2014-06-15 DIAGNOSIS — J449 Chronic obstructive pulmonary disease, unspecified: Secondary | ICD-10-CM

## 2014-06-19 NOTE — Progress Notes (Signed)
Patient ID: Bill [name], male   DOB: [birth year], 68 y.o.   MRN: [id number]    [facility]  Allergies  Allergen Reactions  . Penicillins Other (See Comments)    unknown     Chief Complaint  Patient presents with  . Medical Management of Chronic Issues    HPI:  He is being seen for the management of his chronic illnesses. He is being followed by hospice care. He continues to slowly decline. He is not voicing any complaints today states that he feels about the same; but is having some leg pain present.   Past Medical History  Diagnosis Date  . COPD (chronic obstructive pulmonary disease)   . Diabetes mellitus without complication   . Hyperlipidemia   . Depression   . Chronic combined systolic and diastolic CHF (congestive heart failure)     a. 03/2014 Echo: EF 40-45%, mod conc LVH, mild Ao sclerosis.  . Gout   . OSA (obstructive sleep apnea)   . Pneumonia 07/26/2013  . Chronic respiratory failure 06/19/2013  . GERD (gastroesophageal reflux disease) 06/19/2013  . DM type 2 with diabetic peripheral neuropathy 06/19/2013  . BPH (benign prostatic hyperplasia) 06/19/2013  . AKI (acute kidney injury) 07/27/2013  . Urinary retention 08/11/2013  . Sepsis 07/27/2013  . Physical deconditioning 06/19/2013  . Perineal abscess 08/01/2013  . Depression, controlled 06/19/2013  . Anemia of chronic disease 07/31/2013  . Afib/flutter     a. Dx [facility] 03/2014;  b. CHA2DS2VASc = 4-->Eliquis 5mg  BID.  Marland Kitchen. Abnormal ECG     a. 02/2014 Inferior ST elevation-->cath: nonobstructive CAD.  Marland Kitchen. Syncope and collapse   . Asthma     Past Surgical History  Procedure Laterality Date  . Left eye cataract removal    . Rhinoplasty      VITAL SIGNS BP 121/70  Pulse 80  Ht 5\' 11"  (1.803 m)  Wt 277 lb (125.646 kg)  BMI 38.65 kg/m2   Patient's Medications  New Prescriptions   No medications on file  Previous Medications   ALBUTEROL (PROVENTIL HFA;VENTOLIN HFA) 108 (90 BASE) MCG/ACT INHALER    Inhale  2 puffs into the lungs every 6 (six) hours as needed for wheezing or shortness of breath.    ALLOPURINOL (ZYLOPRIM) 300 MG TABLET    Take 1 tablet (300 mg total) by mouth daily.   APIXABAN (ELIQUIS) 5 MG TABS TABLET    Take 5 mg by mouth 2 (two) times daily.   ASPIRIN 81 MG TABLET    Take 81 mg by mouth daily.   BUDESONIDE-FORMOTEROL (SYMBICORT) 160-4.5 MCG/ACT INHALER    Inhale 2 puffs into the lungs 2 (two) times daily.   CITALOPRAM (CELEXA) 40 MG TABLET    Take 40 mg by mouth daily.   CLONAZEPAM (KLONOPIN) 2 MG TABLET    Take 1 mg by mouth 2 (two) times daily as needed. Take three tablets by mouth every evening as needed for severe anxiety   COLCHICINE 0.6 MG TABLET    Take 0.6 mg by mouth daily as needed.   FERROUS SULFATE 325 (65 FE) MG TABLET    Take 325 mg by mouth 2 (two) times daily.   FLUTICASONE (FLONASE) 50 MCG/ACT NASAL SPRAY    Place 2 sprays into both nostrils daily.   GABAPENTIN (NEURONTIN) 100 MG CAPSULE    Take 100 mg by mouth 2 (two) times daily.   HYDROCODONE-ACETAMINOPHEN (NORCO/VICODIN) 5-325 MG PER TABLET    Take one tablet by mouth every 4  hours as needed for pain; Take one tablet by mouth every night at bedtime   HYDROPHILIC OINTMENT    Apply topically as needed for dry skin.   HYDROXYZINE (ATARAX/VISTARIL) 25 MG TABLET    Take 25 mg by mouth 3 (three) times daily.   INSULIN ASPART (NOVOLOG) 100 UNIT/ML INJECTION    Inject 5 Units into the skin 3 (three) times daily before meals. For cbg >=150   INSULIN DETEMIR (LEVEMIR) 100 UNIT/ML INJECTION    Inject 10 Units into the skin daily.   IPRATROPIUM (ATROVENT) 0.02 % NEBULIZER SOLUTION    Take 0.5 mg by nebulization 4 (four) times daily - after meals and at bedtime.    ISOSORBIDE MONONITRATE (IMDUR) 30 MG 24 HR TABLET    Take 1 tablet (30 mg total) by mouth daily.   LINAGLIPTIN (TRADJENTA) 5 MG TABS TABLET    Take 1 tablet (5 mg total) by mouth daily.   METOLAZONE (ZAROXOLYN) 2.5 MG TABLET    Take 2.5 mg by mouth 3 (three)  times a week. Monday Wednesday Friday   METOPROLOL TARTRATE (LOPRESSOR) 25 MG TABLET    Take 12.5 mg by mouth 2 (two) times daily.   MULTIPLE VITAMINS-MINERALS (PRESERVISION AREDS PO)    Take by mouth daily.   OLANZAPINE (ZYPREXA) 10 MG TABLET    Take 15 mg by mouth daily.    PANTOPRAZOLE (PROTONIX) 40 MG TABLET    Take 40 mg by mouth 2 (two) times daily.   POLYETHYLENE GLYCOL (MIRALAX / GLYCOLAX) PACKET    Take 17 g by mouth as needed.    POTASSIUM CHLORIDE SA (K-DUR,KLOR-CON) 20 MEQ TABLET    Take 20 mEq by mouth daily.   PREDNISONE (DELTASONE) 10 MG TABLET    Take 0.5 tablets (5 mg total) by mouth daily with breakfast.   PRIMIDONE (MYSOLINE) 250 MG TABLET    Take 250 mg by mouth 2 (two) times daily.   SACCHAROMYCES BOULARDII (FLORASTOR) 250 MG CAPSULE    Take 250 mg by mouth 2 (two) times daily.   SENNOSIDES-DOCUSATE SODIUM (SENOKOT-S) 8.6-50 MG TABLET    Take 2 tablets by mouth 2 (two) times daily.    SPIRONOLACTONE (ALDACTONE) 50 MG TABLET    Take 50 mg by mouth 2 (two) times daily.   TAMSULOSIN (FLOMAX) 0.4 MG CAPS CAPSULE    Take 0.4 mg by mouth daily.   TIOTROPIUM (SPIRIVA) 18 MCG INHALATION CAPSULE    Place 18 mcg into inhaler and inhale daily.   TORSEMIDE (DEMADEX) 20 MG TABLET    Take 20 mg by mouth 2 (two) times daily.    TRAMADOL (ULTRAM) 50 MG TABLET    Take 1 tablet by mouth every 12 hours as needed for pain.   ZOLPIDEM (AMBIEN) 10 MG TABLET    Take one tablet by mouth every night at bedtime for insomnia  Modified Medications   No medications on file  Discontinued Medications   No medications on file    SIGNIFICANT DIAGNOSTIC EXAMS  07-26-13: chest x-ray: minimal bibasilar pneumonitis with superimposed mild congestive changes.   08-04-13: kub: bowel gas pattern could be secondary to partial bowel obstruction versus moderate to severe ileus. No abnormal calcifications in the region of the abdomen   08-10-13: ct of pelvis: 4.1 x 2.4 cm left perineal/scrotal abscess.  Associated foci of soft tissue gas, correlate for recent intervention.  Additional 4.3 x 2.3 cm left gluteal fluid collection, suspicious  for early/developing abscess.  Associated mild subcutaneous stranding in the  left gluteal region,  likely reflecting cellulitis.  Mildly thick-walled bladder, correlate for cystitis.  11-09-13: chest x-ray: consistent with chronic chf; findings appear similar to previous studies. There is a question of slight progression of chf.   11-24-13: TEE: Left ventricle: The cavity size was mildly dilated. Systolic function was normal. The estimated ejection fraction was in the range of 55% to 60%. Wall motion was normal; there were no regional wall motion abnormalities.Doppler parameters are consistent with abnormal left ventricular relaxation (grade 1 diastolic dysfunction).  12-08-13: chest x-ray: minimal cardiomegaly without change with minimal pulmonary vascular congestion without change. No pleural effusion. Patchy bilateral atlectasis  or pneumonia appears new.    12-12-13: renal ultrasound: 1. No renal hydronephrosis, renal calculus or renal mass. 2. Increased renal cortical echogenicity involving both kidneys diffusely consistent with medical renal disease. 3. Pre-void urinary bladder volume 59 ml; no urinary bladder mass.   12-20-13: ekg: sinus tachycardia with pvc's   01-10-14: chest x-ray: 1. Resolution of previously noted pneumonia lower lungs. 2. Mild cardiomegaly with borderline pulmonary vasculature.   03/24/14: chest x-ray: Cardiomegaly with mild-moderate CHF; airspace opacities lower lungs bilaterally consistent with superimposed inflammatory process or pneumonia    LAS REVIEWED:   07-10-13: glucose 207; bun 18; creat 1.0; k+ 3.8; na++ 137  07-24-13: glucose 296; bun 24; creat 1.4; k+3.6; na++ 134 07-26-13: wbc 15.5; hgb 9.8; hct 34.1; mcv 85; plt 254; glucose 229; bun 25; creat 1.2; k+3.7;na++ 133 07-31-13: wbc 13.0; hgb 9.2; hct 32.8; mcv 85; plt 267;  glucose 136;bun 26; creat 1.0; k+3.9; na++ 137; liver normal albumin 2.7 hgb a1c 7.7  08-02-13: glucose 97; bun 30;creat 1.2; k+4.0; na++ 140;  08-03-13: wbc 26.2; hgb 10.4; hct 35.9; mcv 85.1 plt 289; glucose 158; bun 29;creat 1.2;k+3.9; na++138  08-09-13: wbc 18.3; hgb 10.3; hct 37.6; mcv 86.2 plt 318  08-21-13: wbc 8.8; hgb 10.8; hct 38.3; mcv 85.1;plt 316; glucose 106; bun 13; creat 0.9; k+3.9; na++138 10-23-13: BNP: 17 10-27-13: glucose 140; bun 31; creat 1.1; k+4.2; na++140  11-06-13: glucose 98; bun 83; creat 1.9; k+4.7; na++133 11-10-13: glucose 141; bun 79; creat 1.8; k+4.8; na++135; BNP 15 11-13-13: liver normal albumin 4.0; pre-albumin 40.19   11-17-13: glucose 84; bun 87; creat 1.6; k+4.4; na++141  12-06-13: glucose 125; bun 73; creat 2.3; k+4.3; na++135  12-11-13: wbc 10.5; hgb 10.6; hct 35.7; mcv 97.5 plt 236; glucose 95; bun 76; creat 1.7; k+4.7; na++138; liver normal albumin 3.9; mag 1.9; phos 4.6  12-25-13: glucose 113; bun 60; creat 1.3; k+4.4; na++139  12-27-13: wbc 11.2; hgb 0.9; hct 32.8; mcv 90; plt 211;  glucose 115; bun 48; creat 1.29; k+4.3; na++139; liver normal albumin 4.3; iron 92; tibc 324  phos 3.4; pth 97; vit d 12.8; hgb a1c 5.9 01-10-14: wbc 12.3; hgb 10.6; hct 33.7; mcv 96; plt 223; glucose 168; bun 33; creat 1.32; k+4.6; na++139 01-16-14: wbc 9.1; hgb 11.0; hct 38.9 ;mcv 103.5; plt 217; glucose 122; bun 28; creat 1.0; k+3.9; na++141 mag 2.7; folic acid 9.15; vitamin b12: 226  04-09-14: glucose 106; bun 31; creat 0.9; k+3.9; na++139  05-21-14: wbc 11.2; hgb 8.6; hct 32.1; mcv 89.2; plt 387; uric acid 9.1     .Review of Systems  Constitutional: has weakness and fatigue  HENT: negative for congestion      Respiratory: negative for cough .no   shortness of breath and wheezing Cardiovascular: Negative for chest pain, palpitations edema  Gastrointestinal: Negative for heartburn, abdominal pain and constipation.  Musculoskeletal:has  leg pain   Skin: negative     Psychiatric/Behavioral: The patient is not nervous/anxious and does not have insomnia.         Physical Exam  Constitutional: He is oriented to person, place, and time. He appears well-developed and well-nourished. No distress.  obese  Neck: Neck supple. No JVD present. No thyromegaly present.  Cardiovascular: Normal rate, regular rhythm and intact distal pulses.   Respiratory: Effort normal breath sounds diminished. No respiratory distress.no wheezes   GI: Soft. Bowel sounds are normal. He exhibits no distension. There is no tenderness.  Musculoskeletal: Normal range of motion. Has 1-2+ edema present ; Neurological: He is alert and oriented to person, place, and time.  Skin: Skin is warm. He is not diaphoretic.  Psychiatric: He has a normal mood and affect.         Assessment/Plan  1.  Diastolic heart failure: no acute change; will continue daily weights; is off fluid restrictions; aldactone 50 mg twice daily; demadex 20 mg three times daily and zaroxolyn 2.5 mg three times a week M/W/F; will monitor   2. End Stage COPD: He is having increased shortness of breath; Did ask nurse to give breathing treatment now and scheduled inhalers. Will continue prednisone 5 mg daily;  symbicort 160/4.5 mcg 2 puffs twice daily; albuterol 2 puffs every 6 hours as needed; duoneb twice daily flonase daily and will monitor his status.   3. Diabetes: Blood sugars range from 200-150s;  will continue novolog 5 units prior to meals for cbg >=150; levemir 10 units nightly and tradjenta 5 mg daily   4. Hypertension: is stable; will continue asa 81 mg daily and imdur 30 mg daily and metoprolol 12.5mg  BID  5. Essential tremor: is stable will continue primidone 250 mg twice daily   6. Anemia:stable; Iron 325mg  BID  7. Gout: no issues;  will continue allopurinol 300 mg daily   8. GERD: no issues; will continue protonix 40 mg BID  9. Constipation: will continue senna s 2 tabs twice daily and miralax  daily   10. Depression with anxiety: will continue his celexa 40 mg daily with zyprexa 15 mg daily; will continue klonopin 1 mg twice daily as needed   11. BPH: will continue flomax 0.4mg  daily  12. CKD stage IV: Renal function stable, creat 0.9, BUN 33; will continue to monitor   13. Peripheral neuropathy: will increase his neurontin to 200 mg three times daily    Time spent with patient 50 minutes due to high complexity of patient.    Synthia Innocent NP [facility]  Contact [phone] Monday through Friday 8am- 5pm  After hours call [phone]

## 2014-06-19 NOTE — Progress Notes (Signed)
Patient ID: Bill MeekerJesse C Ramus, male   DOB: [birth year], 68 y.o.   MRN: [id number]     [facility]  Allergies  Allergen Reactions  . Penicillins Other (See Comments)    unknown     Chief Complaint  Patient presents with  . Acute Visit    change in status     HPI: He continues to decline. The hospice staff and the nursing staff feel as though he is no longer benefiting from most of his medications. His family desires for him to remain comfortable. He is unable to fully participate in the hpi or ros. He states that he is not feeling good and feels weak with shortness of breath.  He continues to be followed by hospice care.   Past Medical History  Diagnosis Date  . COPD (chronic obstructive pulmonary disease)   . Diabetes mellitus without complication   . Hyperlipidemia   . Depression   . Chronic combined systolic and diastolic CHF (congestive heart failure)     a. 03/2014 Echo: EF 40-45%, mod conc LVH, mild Ao sclerosis.  . Gout   . OSA (obstructive sleep apnea)   . Pneumonia 07/26/2013  . Chronic respiratory failure 06/19/2013  . GERD (gastroesophageal reflux disease) 06/19/2013  . DM type 2 with diabetic peripheral neuropathy 06/19/2013  . BPH (benign prostatic hyperplasia) 06/19/2013  . AKI (acute kidney injury) 07/27/2013  . Urinary retention 08/11/2013  . Sepsis 07/27/2013  . Physical deconditioning 06/19/2013  . Perineal abscess 08/01/2013  . Depression, controlled 06/19/2013  . Anemia of chronic disease 07/31/2013  . Afib/flutter     a. [facility];  b. CHA2DS2VASc = 4-->Eliquis 5mg  BID.  Marland Kitchen. Abnormal ECG     a. 02/2014 Inferior ST elevation-->cath: nonobstructive CAD.  Marland Kitchen. Syncope and collapse   . Asthma     Past Surgical History  Procedure Laterality Date  . Left eye cataract removal    . Rhinoplasty      VITAL SIGNS BP 110/67  Pulse 65  Ht 5\' 11"  (1.803 m)  Wt 277 lb (125.646 kg)  BMI 38.65 kg/m2   Patient's Medications  New Prescriptions   No  medications on file  Previous Medications   ALBUTEROL (PROVENTIL HFA;VENTOLIN HFA) 108 (90 BASE) MCG/ACT INHALER    Inhale 2 puffs into the lungs every 6 (six) hours as needed for wheezing or shortness of breath.    ALLOPURINOL (ZYLOPRIM) 300 MG TABLET    Take 1 tablet (300 mg total) by mouth daily.   APIXABAN (ELIQUIS) 5 MG TABS TABLET    Take 5 mg by mouth 2 (two) times daily.   ASPIRIN 81 MG TABLET    Take 81 mg by mouth daily.   BUDESONIDE-FORMOTEROL (SYMBICORT) 160-4.5 MCG/ACT INHALER    Inhale 2 puffs into the lungs 2 (two) times daily.   CITALOPRAM (CELEXA) 40 MG TABLET    Take 40 mg by mouth daily.   CLONAZEPAM (KLONOPIN) 2 MG TABLET    Take 1 mg by mouth 2 (two) times daily as needed. Take three tablets by mouth every evening as needed for severe anxiety   COLCHICINE 0.6 MG TABLET    Take 0.6 mg by mouth daily as needed.   FERROUS SULFATE 325 (65 FE) MG TABLET    Take 325 mg by mouth 2 (two) times daily.   FLUTICASONE (FLONASE) 50 MCG/ACT NASAL SPRAY    Place 2 sprays into both nostrils daily.   GABAPENTIN (NEURONTIN) 100 MG CAPSULE  Take 100 mg by mouth 2 (two) times daily.   HYDROCODONE-ACETAMINOPHEN (NORCO/VICODIN) 5-325 MG PER TABLET    Take one tablet by mouth every 4 hours as needed for pain; Take one tablet by mouth every night at bedtime   HYDROPHILIC OINTMENT    Apply topically as needed for dry skin.   HYDROXYZINE (ATARAX/VISTARIL) 25 MG TABLET    Take 25 mg by mouth 3 (three) times daily.   INSULIN ASPART (NOVOLOG) 100 UNIT/ML INJECTION    Inject 5 Units into the skin 3 (three) times daily before meals. For cbg >=150   INSULIN DETEMIR (LEVEMIR) 100 UNIT/ML INJECTION    Inject 10 Units into the skin daily.   IPRATROPIUM (ATROVENT) 0.02 % NEBULIZER SOLUTION    Take 0.5 mg by nebulization 4 (four) times daily - after meals and at bedtime.    ISOSORBIDE MONONITRATE (IMDUR) 30 MG 24 HR TABLET    Take 1 tablet (30 mg total) by mouth daily.   LINAGLIPTIN (TRADJENTA) 5 MG TABS  TABLET    Take 1 tablet (5 mg total) by mouth daily.   METOLAZONE (ZAROXOLYN) 2.5 MG TABLET    Take 2.5 mg by mouth 3 (three) times a week. Monday Wednesday Friday   METOPROLOL TARTRATE (LOPRESSOR) 25 MG TABLET    Take 12.5 mg by mouth 2 (two) times daily.   MULTIPLE VITAMINS-MINERALS (PRESERVISION AREDS PO)    Take by mouth daily.   OLANZAPINE (ZYPREXA) 10 MG TABLET    Take 15 mg by mouth daily.    PANTOPRAZOLE (PROTONIX) 40 MG TABLET    Take 40 mg by mouth 2 (two) times daily.   POLYETHYLENE GLYCOL (MIRALAX / GLYCOLAX) PACKET    Take 17 g by mouth as needed.    POTASSIUM CHLORIDE SA (K-DUR,KLOR-CON) 20 MEQ TABLET    Take 20 mEq by mouth daily.   PREDNISONE (DELTASONE) 10 MG TABLET    Take 0.5 tablets (5 mg total) by mouth daily with breakfast.   PRIMIDONE (MYSOLINE) 250 MG TABLET    Take 250 mg by mouth 2 (two) times daily.   SACCHAROMYCES BOULARDII (FLORASTOR) 250 MG CAPSULE    Take 250 mg by mouth 2 (two) times daily.   SENNOSIDES-DOCUSATE SODIUM (SENOKOT-S) 8.6-50 MG TABLET    Take 2 tablets by mouth 2 (two) times daily.    SPIRONOLACTONE (ALDACTONE) 50 MG TABLET    Take 50 mg by mouth 2 (two) times daily.   TAMSULOSIN (FLOMAX) 0.4 MG CAPS CAPSULE    Take 0.4 mg by mouth daily.   TIOTROPIUM (SPIRIVA) 18 MCG INHALATION CAPSULE    Place 18 mcg into inhaler and inhale daily.   TORSEMIDE (DEMADEX) 20 MG TABLET    Take 20 mg by mouth 2 (two) times daily.    TRAMADOL (ULTRAM) 50 MG TABLET    Take 1 tablet by mouth every 12 hours as needed for pain.   ZOLPIDEM (AMBIEN) 10 MG TABLET    Take one tablet by mouth every night at bedtime for insomnia  Modified Medications   No medications on file  Discontinued Medications   No medications on file    SIGNIFICANT DIAGNOSTIC EXAMS  07-26-13: chest x-ray: minimal bibasilar pneumonitis with superimposed mild congestive changes.   08-04-13: kub: bowel gas pattern could be secondary to partial bowel obstruction versus moderate to severe ileus. No  abnormal calcifications in the region of the abdomen   08-10-13: ct of pelvis: 4.1 x 2.4 cm left perineal/scrotal abscess. Associated foci of soft tissue  gas, correlate for recent intervention.  Additional 4.3 x 2.3 cm left gluteal fluid collection, suspicious  for early/developing abscess.  Associated mild subcutaneous stranding in the left gluteal region,  likely reflecting cellulitis.  Mildly thick-walled bladder, correlate for cystitis.  11-09-13: chest x-ray: consistent with chronic chf; findings appear similar to previous studies. There is a question of slight progression of chf.   11-24-13: TEE: Left ventricle: The cavity size was mildly dilated. Systolic function was normal. The estimated ejection fraction was in the range of 55% to 60%. Wall motion was normal; there were no regional wall motion abnormalities.Doppler parameters are consistent with abnormal left ventricular relaxation (grade 1 diastolic dysfunction).  12-08-13: chest x-ray: minimal cardiomegaly without change with minimal pulmonary vascular congestion without change. No pleural effusion. Patchy bilateral atlectasis  or pneumonia appears new.    12-12-13: renal ultrasound: 1. No renal hydronephrosis, renal calculus or renal mass. 2. Increased renal cortical echogenicity involving both kidneys diffusely consistent with medical renal disease. 3. Pre-void urinary bladder volume 59 ml; no urinary bladder mass.   12-20-13: ekg: sinus tachycardia with pvc's   01-10-14: chest x-ray: 1. Resolution of previously noted pneumonia lower lungs. 2. Mild cardiomegaly with borderline pulmonary vasculature.   03/24/14: chest x-ray: Cardiomegaly with mild-moderate CHF; airspace opacities lower lungs bilaterally consistent with superimposed inflammatory process or pneumonia    LAS REVIEWED:   07-10-13: glucose 207; bun 18; creat 1.0; k+ 3.8; na++ 137  07-24-13: glucose 296; bun 24; creat 1.4; k+3.6; na++ 134 07-26-13: wbc 15.5; hgb 9.8; hct  34.1; mcv 85; plt 254; glucose 229; bun 25; creat 1.2; k+3.7;na++ 133 07-31-13: wbc 13.0; hgb 9.2; hct 32.8; mcv 85; plt 267; glucose 136;bun 26; creat 1.0; k+3.9; na++ 137; liver normal albumin 2.7 hgb a1c 7.7  08-02-13: glucose 97; bun 30;creat 1.2; k+4.0; na++ 140;  08-03-13: wbc 26.2; hgb 10.4; hct 35.9; mcv 85.1 plt 289; glucose 158; bun 29;creat 1.2;k+3.9; na++138  08-09-13: wbc 18.3; hgb 10.3; hct 37.6; mcv 86.2 plt 318  08-21-13: wbc 8.8; hgb 10.8; hct 38.3; mcv 85.1;plt 316; glucose 106; bun 13; creat 0.9; k+3.9; na++138 10-23-13: BNP: 17 10-27-13: glucose 140; bun 31; creat 1.1; k+4.2; na++140  11-06-13: glucose 98; bun 83; creat 1.9; k+4.7; na++133 11-10-13: glucose 141; bun 79; creat 1.8; k+4.8; na++135; BNP 15 11-13-13: liver normal albumin 4.0; pre-albumin 40.19   11-17-13: glucose 84; bun 87; creat 1.6; k+4.4; na++141  12-06-13: glucose 125; bun 73; creat 2.3; k+4.3; na++135  12-11-13: wbc 10.5; hgb 10.6; hct 35.7; mcv 97.5 plt 236; glucose 95; bun 76; creat 1.7; k+4.7; na++138; liver normal albumin 3.9; mag 1.9; phos 4.6  12-25-13: glucose 113; bun 60; creat 1.3; k+4.4; na++139  12-27-13: wbc 11.2; hgb 0.9; hct 32.8; mcv 90; plt 211;  glucose 115; bun 48; creat 1.29; k+4.3; na++139; liver normal albumin 4.3; iron 92; tibc 324  phos 3.4; pth 97; vit d 12.8; hgb a1c 5.9 01-10-14: wbc 12.3; hgb 10.6; hct 33.7; mcv 96; plt 223; glucose 168; bun 33; creat 1.32; k+4.6; na++139 01-16-14: wbc 9.1; hgb 11.0; hct 38.9 ;mcv 103.5; plt 217; glucose 122; bun 28; creat 1.0; k+3.9; na++141 mag 2.7; folic acid 9.15; vitamin b12: 226  04-09-14: glucose 106; bun 31; creat 0.9; k+3.9; na++139  05-21-14: wbc 11.2; hgb 8.6; hct 32.1; mcv 89.2; plt 387; uric acid 9.1    Review of Systems  Unable to perform ROS      Physical Exam  Constitutional: He is oriented to person, place,  and time. He appears well-developed and well-nourished. No distress.  obese  Neck: Neck supple. Positive  JVD present. No thyromegaly  present.  Cardiovascular: Normal rate, regular rhythm and intact distal pulses.   Respiratory: Effort normal breath sounds diminished. No respiratory distress.  GI: Soft. Bowel sounds are normal. He exhibits no distension. There is no tenderness.  Musculoskeletal: Normal range of motion. Has 1-2+ edema present ; Neurological: He is alert Skin: Skin is warm. He is not diaphoretic.       ASSESSMENT/ PLAN:  1. COPD 2. Diastolic heart failure:   He and his family desire for comfort care only. There is no desire for further lab work. Will stop the following medications: aldactone; demadex; eliquis; presversion; k+'; zaroxolyn; tradjenta Will being roxanol 5 mg every 2 hours as needed for pain or distress and will monitor his status.   Time spent with patient 45 minutes.

## 2014-06-19 NOTE — Progress Notes (Signed)
Patient ID: Bill [name], male   DOB: [birth year], 68 y.o.   MRN: [id number]     [facility]  Allergies  Allergen Reactions  . Penicillins Other (See Comments)    unknown     Chief Complaint  Patient presents with  . Acute Visit    end of life issues     HPI:  He has declined further. He is unable to take most of his medications and is unable to utilize his inhalers. He looks to be uncomfortable and in distress by the nursing staff. The staff tells me that he hsa required frequent use of his roxanol and feel as though he would benefit from routine dosing.    Past Medical History  Diagnosis Date  . COPD (chronic obstructive pulmonary disease)   . Diabetes mellitus without complication   . Hyperlipidemia   . Depression   . Chronic combined systolic and diastolic CHF (congestive heart failure)     a. 03/2014 Echo: EF 40-45%, mod conc LVH, mild Ao sclerosis.  . Gout   . OSA (obstructive sleep apnea)   . Pneumonia 07/26/2013  . Chronic respiratory failure 06/19/2013  . GERD (gastroesophageal reflux disease) 06/19/2013  . DM type 2 with diabetic peripheral neuropathy 06/19/2013  . BPH (benign prostatic hyperplasia) 06/19/2013  . AKI (acute kidney injury) 07/27/2013  . Urinary retention 08/11/2013  . Sepsis 07/27/2013  . Physical deconditioning 06/19/2013  . Perineal abscess 08/01/2013  . Depression, controlled 06/19/2013  . Anemia of chronic disease 07/31/2013  . Afib/flutter     a. [facility] 03/2014;  b. CHA2DS2VASc = 4-->Eliquis 5mg  BID.  Marland Kitchen. Abnormal ECG     a. 02/2014 Inferior ST elevation-->cath: nonobstructive CAD.  Marland Kitchen. Syncope and collapse   . Asthma     Past Surgical History  Procedure Laterality Date  . Left eye cataract removal    . Rhinoplasty      VITAL SIGNS BP 98/56  Pulse 70   Patient's Medications  New Prescriptions   No medications on file  Previous Medications   ALBUTEROL (PROVENTIL HFA;VENTOLIN HFA) 108 (90 BASE) MCG/ACT INHALER    Inhale 2 puffs  into the lungs every 6 (six) hours as needed for wheezing or shortness of breath.    ASPIRIN 81 MG TABLET    Take 81 mg by mouth daily.   BUDESONIDE-FORMOTEROL (SYMBICORT) 160-4.5 MCG/ACT INHALER    Inhale 2 puffs into the lungs 2 (two) times daily.   CITALOPRAM (CELEXA) 40 MG TABLET    Take 40 mg by mouth daily.   CLONAZEPAM (KLONOPIN) 2 MG TABLET    Take 1 mg by mouth 2 (two) times daily as needed. Take three tablets by mouth every evening as needed for severe anxiety   FLUTICASONE (FLONASE) 50 MCG/ACT NASAL SPRAY    Place 2 sprays into both nostrils daily.   GABAPENTIN (NEURONTIN) 100 MG CAPSULE    Take 100 mg by mouth 2 (two) times daily.   HYDROCODONE-ACETAMINOPHEN (NORCO/VICODIN) 5-325 MG PER TABLET    Take one tablet by mouth every 4 hours as needed for pain; Take one tablet by mouth every night at bedtime   INSULIN ASPART (NOVOLOG) 100 UNIT/ML INJECTION    Inject 5 Units into the skin 3 (three) times daily before meals. For cbg >=150   INSULIN DETEMIR (LEVEMIR) 100 UNIT/ML INJECTION    Inject 10 Units into the skin daily.   IPRATROPIUM (ATROVENT) 0.02 % NEBULIZER SOLUTION    Take 0.5 mg by nebulization 4 (  four) times daily - after meals and at bedtime.    ISOSORBIDE MONONITRATE (IMDUR) 30 MG 24 HR TABLET    Take 1 tablet (30 mg total) by mouth daily.   METOPROLOL TARTRATE (LOPRESSOR) 25 MG TABLET    Take 12.5 mg by mouth 2 (two) times daily.   OLANZAPINE (ZYPREXA) 10 MG TABLET    Take 15 mg by mouth daily.    PANTOPRAZOLE (PROTONIX) 40 MG TABLET    Take 40 mg by mouth 2 (two) times daily.   POLYETHYLENE GLYCOL (MIRALAX / GLYCOLAX) PACKET    Take 17 g by mouth as needed.    PREDNISONE (DELTASONE) 10 MG TABLET    Take 0.5 tablets (5 mg total) by mouth daily with breakfast.   PRIMIDONE (MYSOLINE) 250 MG TABLET    Take 250 mg by mouth 2 (two) times daily.   SACCHAROMYCES BOULARDII (FLORASTOR) 250 MG CAPSULE    Take 250 mg by mouth 2 (two) times daily.   SENNOSIDES-DOCUSATE SODIUM (SENOKOT-S)  8.6-50 MG TABLET    Take 2 tablets by mouth 2 (two) times daily.    TAMSULOSIN (FLOMAX) 0.4 MG CAPS CAPSULE    Take 0.4 mg by mouth daily.   TIOTROPIUM (SPIRIVA) 18 MCG INHALATION CAPSULE    Place 18 mcg into inhaler and inhale daily.   TRAMADOL (ULTRAM) 50 MG TABLET    Take 1 tablet by mouth every 12 hours as needed for pain.   ZOLPIDEM (AMBIEN) 10 MG TABLET    Take one tablet by mouth every night at bedtime for insomnia  Modified Medications   No medications on file  Discontinued Medications   No medications on file    SIGNIFICANT DIAGNOSTIC EXAMS   07-26-13: chest x-ray: minimal bibasilar pneumonitis with superimposed mild congestive changes.   08-04-13: kub: bowel gas pattern could be secondary to partial bowel obstruction versus moderate to severe ileus. No abnormal calcifications in the region of the abdomen   08-10-13: ct of pelvis: 4.1 x 2.4 cm left perineal/scrotal abscess. Associated foci of soft tissue gas, correlate for recent intervention.  Additional 4.3 x 2.3 cm left gluteal fluid collection, suspicious  for early/developing abscess.  Associated mild subcutaneous stranding in the left gluteal region,  likely reflecting cellulitis.  Mildly thick-walled bladder, correlate for cystitis.  11-09-13: chest x-ray: consistent with chronic chf; findings appear similar to previous studies. There is a question of slight progression of chf.   11-24-13: TEE: Left ventricle: The cavity size was mildly dilated. Systolic function was normal. The estimated ejection fraction was in the range of 55% to 60%. Wall motion was normal; there were no regional wall motion abnormalities.Doppler parameters are consistent with abnormal left ventricular relaxation (grade 1 diastolic dysfunction).  12-08-13: chest x-ray: minimal cardiomegaly without change with minimal pulmonary vascular congestion without change. No pleural effusion. Patchy bilateral atlectasis  or pneumonia appears new.    12-12-13: renal  ultrasound: 1. No renal hydronephrosis, renal calculus or renal mass. 2. Increased renal cortical echogenicity involving both kidneys diffusely consistent with medical renal disease. 3. Pre-void urinary bladder volume 59 ml; no urinary bladder mass.   12-20-13: ekg: sinus tachycardia with pvc's   01-10-14: chest x-ray: 1. Resolution of previously noted pneumonia lower lungs. 2. Mild cardiomegaly with borderline pulmonary vasculature.   03/24/14: chest x-ray: Cardiomegaly with mild-moderate CHF; airspace opacities lower lungs bilaterally consistent with superimposed inflammatory process or pneumonia    LAS REVIEWED:   07-10-13: glucose 207; bun 18; creat 1.0; k+ 3.8; na++ 137  07-24-13: glucose 296; bun 24; creat 1.4; k+3.6; na++ 134 07-26-13: wbc 15.5; hgb 9.8; hct 34.1; mcv 85; plt 254; glucose 229; bun 25; creat 1.2; k+3.7;na++ 133 07-31-13: wbc 13.0; hgb 9.2; hct 32.8; mcv 85; plt 267; glucose 136;bun 26; creat 1.0; k+3.9; na++ 137; liver normal albumin 2.7 hgb a1c 7.7  08-02-13: glucose 97; bun 30;creat 1.2; k+4.0; na++ 140;  08-03-13: wbc 26.2; hgb 10.4; hct 35.9; mcv 85.1 plt 289; glucose 158; bun 29;creat 1.2;k+3.9; na++138  08-09-13: wbc 18.3; hgb 10.3; hct 37.6; mcv 86.2 plt 318  08-21-13: wbc 8.8; hgb 10.8; hct 38.3; mcv 85.1;plt 316; glucose 106; bun 13; creat 0.9; k+3.9; na++138 10-23-13: BNP: 17 10-27-13: glucose 140; bun 31; creat 1.1; k+4.2; na++140  11-06-13: glucose 98; bun 83; creat 1.9; k+4.7; na++133 11-10-13: glucose 141; bun 79; creat 1.8; k+4.8; na++135; BNP 15 11-13-13: liver normal albumin 4.0; pre-albumin 40.19   11-17-13: glucose 84; bun 87; creat 1.6; k+4.4; na++141  12-06-13: glucose 125; bun 73; creat 2.3; k+4.3; na++135  12-11-13: wbc 10.5; hgb 10.6; hct 35.7; mcv 97.5 plt 236; glucose 95; bun 76; creat 1.7; k+4.7; na++138; liver normal albumin 3.9; mag 1.9; phos 4.6  12-25-13: glucose 113; bun 60; creat 1.3; k+4.4; na++139  12-27-13: wbc 11.2; hgb 0.9; hct 32.8; mcv 90; plt  211;  glucose 115; bun 48; creat 1.29; k+4.3; na++139; liver normal albumin 4.3; iron 92; tibc 324  phos 3.4; pth 97; vit d 12.8; hgb a1c 5.9 01-10-14: wbc 12.3; hgb 10.6; hct 33.7; mcv 96; plt 223; glucose 168; bun 33; creat 1.32; k+4.6; na++139 01-16-14: wbc 9.1; hgb 11.0; hct 38.9 ;mcv 103.5; plt 217; glucose 122; bun 28; creat 1.0; k+3.9; na++141 mag 2.7; folic acid 9.15; vitamin b12: 226  04-09-14: glucose 106; bun 31; creat 0.9; k+3.9; na++139  05-21-14: wbc 11.2; hgb 8.6; hct 32.1; mcv 89.2; plt 387; uric acid 9.1    Review of Systems  Unable to perform ROS      Physical Exam  Constitutional: He is oriented to person, place, and time. He appears well-developed and well-nourished. No distress.  obese  Neck: Neck supple. Positive  JVD present. No thyromegaly present.  Cardiovascular: Normal rate, regular rhythm and intact distal pulses.   Respiratory: Effort normal breath sounds diminished. No respiratory distress.  GI: Soft. Bowel sounds are normal. He exhibits no distension. There is no tenderness.  Musculoskeletal: Normal range of motion. Has 1-2+ edema present ; Neurological: He is alert Skin: Skin is warm. He is not diaphoretic.       ASSESSMENT/ PLAN:  1. COPD 2. Diastolic heart failure:   Will stop the following medications: spiriva; novolog; levemir; imdur; asa; flonase; allopurinol; symbicort; albuterol inhaler; lopressor; prednisone; neurontin; florastor; vicodin.  Will lower the protonix to one time daily Will change the roxanol to 5 mg every 4 hours routinely and every hour as needed Will continue to provide his care to focus upon comfort at the end of his life

## 2014-06-25 ENCOUNTER — Ambulatory Visit: Payer: Medicare Other | Admitting: Cardiovascular Disease

## 2014-07-01 DEATH — deceased

## 2014-12-18 NOTE — H&P (Signed)
PATIENT NAME:  Bill [name], Bill [name] MR#:  [id number] DATE OF BIRTH:  1946-07-14  DATE OF ADMISSION:  05/02/2012  REFERRING PHYSICIAN: Chiquita Loth, MD  FAMILY PHYSICIAN: Mila Merry, MD  REASON FOR ADMISSION: Acute respiratory failure requiring BiPAP.   HISTORY OF PRESENT ILLNESS: The patient is a 69 year old male with a history of chronic obstructive pulmonary disease/tobacco abuse, obesity, and sleep apnea who presents to the Emergency Room today with a one-week history of progressive weakness, shortness of breath, and fever. The patient has been dizzy and had more frequent falls. In the Emergency Room, the patient was noted to be febrile, tachycardic, and profoundly hypoxic. He was placed on BiPAP in the Emergency Room due to hypercarbia. He is now admitted for further evaluation.   PAST MEDICAL HISTORY:  1. Chronic obstructive pulmonary disease/tobacco abuse.  2. Chronic respiratory failure.  3. Depression. 4. Obesity.  5. Obstructive sleep apnea.  6. Type 2 diabetes mellitus.  7. Hyperlipidemia.  8. Gout.  9. History of congestive heart failure.  10. History of nasal surgery.   MEDICATIONS:  1. Zantac 150 mg p.o. twice a day. 2. Voltaren 75 mg p.o. twice a day p.r.n.  3. Toprol-XL 25 mg p.o. daily.  4. Spiriva 1 capsule inhaled daily.  5. Sliding scale insulin as directed.  6. Percocet 10/325 mg one p.o. every 6 hours p.r.n. pain.  7. Advair 250/50 one puff twice a day.  8. Albuterol SVNs every four hours p.r.n. shortness of breath.  9. Norvasc 10 mg p.o. daily.  10. Metformin ER 500 mg p.o. daily.  11. Mevacor 20 mg p.o. every p.m.  12. Colchicine 0.6 mg p.o. twice a day. 13. Celexa 40 mg p.o. daily.  14. Aspirin 81 mg p.o. daily.  15. Allopurinol 300 mg p.o. daily.   ALLERGIES: No known drug allergies.   SOCIAL HISTORY: The patient has a long-standing history of tobacco abuse but denies alcohol abuse.   FAMILY HISTORY: Positive for diabetes, hypertension, and coronary  artery disease.   REVIEW OF SYSTEMS: CONSTITUTIONAL: The patient has had fever but denies change in weight. EYES: No blurred or double vision. No glaucoma. ENT: No tinnitus or hearing loss. He has had some clear nasal drainage. No difficulty swallowing. RESPIRATORY: The patient has had cough and wheezing. Denies hemoptysis or painful respirations. CARDIOVASCULAR: No chest pain or orthopnea. No palpitations or syncope. GI: No nausea, vomiting, or diarrhea. No abdominal pain. No change in bowel habits. GU: No dysuria or hematuria. No incontinence. ENDOCRINE: No polyuria or polydipsia. No heat or cold intolerance. HEMATOLOGIC: The patient denies anemia, easy bruising, or bleeding. LYMPHATIC: No swollen glands. MUSCULOSKELETAL: The patient does have pain in his back and knees, but denies pain in his neck, shoulders, or hips. He does have gout. NEUROLOGIC: No numbness but has generalized weakness. Denies migraines, stroke, or seizures. PSYCH: The patient denies anxiety, insomnia, or depression.   PHYSICAL EXAMINATION:   GENERAL: The patient is acutely ill-appearing, in moderate respiratory distress, on BiPAP.   VITAL SIGNS: Vital signs are remarkable for a blood pressure of 166/93 with a heart rate of 183 and a respiratory rate of 20. Temperature is 99.3.   HEENT: Normocephalic, atraumatic. Pupils are equally round and reactive to light and accommodation. Extraocular movements are intact. Sclerae are anicteric. Conjunctivae are clear.  Oropharynx is clear.   NECK: Supple without jugular venous distention or bruits. No adenopathy or thyromegaly is noted.  LUNGS: Decreased breath sounds with diffuse wheezes. No rales or rhonchi.  HEART: Rapid rate with a regular rhythm. Normal S1 and S2. No significant murmurs.   ABDOMEN: Soft and nontender with normoactive bowel sounds. No organomegaly or masses were appreciated. No hernias or bruits were noted.   EXTREMITIES: No clubbing, cyanosis or edema. Pulses  were 2+ bilaterally.   SKIN: Warm and dry without rash or lesions.   NEUROLOGIC: Cranial nerves II through XII grossly intact. Deep tendon reflexes were symmetric. Motor and sensory examination is nonfocal.   PSYCH: Exam revealed a patient who is alert and oriented to person, place, and time. He was cooperative and used good judgment.  RESULTS: ABG on 5 liters revealed a pH of 7.29 with a pCO2 of 76 and a pO2 of 81 with a saturation of 94.5%.   Troponin was 0.03. White count was 16.4 with a hemoglobin of 16.1. Glucose was 224 with a BUN of 27 and a creatinine 1.33 with a sodium of 135 and a GFR of 56.   Chest x-ray revealed presumed pneumonia.   EKG revealed sinus rhythm with no acute ischemic changes.   ASSESSMENT:  1. Acute on chronic respiratory failure.  2. Pneumonia.  3. Chronic obstructive pulmonary disease exacerbation.  4. Obstructive sleep apnea.  5. Obesity.  6. Depression.  7. Type 2 diabetes.  8. Chronic renal insufficiency.  9. Gout.  10. History of congestive heart failure.   PLAN: The patient will be admitted to the Intensive Care Unit on BiPAP. We will wean off BiPAP to nasal cannula as tolerated. We will begin IV steroids with IV antibiotics and Xopenex and Atrovent SVNs. We will continue his Advair and Spiriva. We will send sputum for Gram stain and culture and sensitivity as well as blood cultures. We will consult pulmonology later today for further treatment and evaluation of his respiratory failure. We will follow up a blood gas and a chest x-ray tomorrow. We will follow his sugars with Accu-Cheks before meals and at bedtime and add sliding scale insulin as needed. We will follow serial cardiac enzymes and obtain an echocardiogram. Further treatment and evaluation will depend upon the patient's progress.   TOTAL TIME SPENT: 50 minutes.  ____________________________ Duane LopeJeffrey D. Judithann SheenSparks, MD jds:slb D: 05/02/2012 04:11:34 ET T: 05/02/2012 13:08:50  ET JOB#: [id number]  cc: Duane LopeJeffrey D. Judithann SheenSparks, MD, <Dictator> Demetrios Isaacsonald E. Sherrie MustacheFisher, MD JEFFREY Rodena Medin SPARKS MD ELECTRONICALLY SIGNED 05/02/2012 20:19

## 2014-12-18 NOTE — Discharge Summary (Signed)
PATIENT NAME:  Bill [name], Bill [name] MR#:  [id number] DATE OF BIRTH:  17-Jun-1946  DATE OF ADMISSION:  05/02/2012 DATE OF DISCHARGE:  05/08/2012  DISCHARGE DIAGNOSES:  1. SIRS possibly due to bronchitis and acute on chronic respiratory failure due to chronic obstructive pulmonary disease exacerbation and acute bronchitis.  2. History of chronic congestive heart failure mixed, with systolic and diastolic dysfunction.  3. Mild acute renal failure, resolved.  4. Diabetes.  5. Hypertension.  6. Sleep apnea. 7. Chronic respiratory failure due to chronic obstructive pulmonary disease on home oxygen.   DISPOSITION: The patient is being discharged home.   FOLLOWUP: Follow up with primary care physician Dr. Sherrie Mustache and pulmonologist  Dr. Meredeth Ide 1 to 2 weeks after discharge.   DIET: Low sodium, 1800-calorie  ADA diet.   REFERRALS: The patient is being discharged home with Home Health. He has been advised to use his CPAP at night.   DISCHARGE MEDICATIONS:  1. Janumet 50/500, 1 tablet b.i.d.  2. Albuterol metered dose inhaler 90 mcg, 2 puffs 1-2 times a day as needed. 3. Albuterol nebulized 1 to 2 times a day as needed.  4. Allopurinol 300 mg daily.  5. Citalopram 40 mg daily.  6. Tylenol/oxycodone 325/10 mg, 1 tablet q. 6 hours p.r.n.  7. Prednisone taper. The patient has been advised to continue his usual dose of prednisone 10 mg daily once his prednisone taper is finished. 8. Spiriva 18 mcg inhaled daily.  9. Neurontin 100 mg b.i.d.  10. Advair 250/50 1 puff b.i.d. 11. Aspirin 81 mg daily. 12. Norvasc 10 mg daily.  13. Lovastatin 20 mg daily.  14. Levaquin 500 mg daily for three days.   RESULTS: Chest x-ray showed no acute abnormality. CT of the head showed no acute abnormalities. Microbiology: Blood cultures have shown no growth so far. Echo showed mildly reduced LV systolic function, mildly dilated left atrium, mild MR and mild to moderate TR. Ejection fraction is 50 to 55%. White count 16.4 on  admission, normal by the time of discharge. Normal platelet count and hemoglobin. Creatinine 1.33 on admission, normal by the time of discharge. Sodium 135 on admission, normal by the time of discharge. BNP 5784. Cardiac enzymes negative.   CONSULTATIONS: Pulmonary consultation with Dr. Meredeth Ide.   HOSPITAL COURSE: The patient is a 69 year old male with history of sleep apnea not compliant with CPAP, chronic respiratory failure due to chronic obstructive pulmonary disease on home oxygen, congestive heart failure, diabetes, and hypertension, presented with shortness of breath. He was admitted with a diagnosis of SIRS with tachycardia and leukocytosis on admission. The source was felt to be pulmonary/bronchitis. Blood cultures were sent and have shown no growth so far. His chest x-ray did not show any infiltrate. After treatment with antibiotics his white count normalized. He was initially on BiPAP and has been transitioned to CPAP at bedtime. He was treated with nebulizer treatment, Spiriva, Advair, IV antibiotics which have been switched to oral antibiotics at the time of discharge. He was on IV steroids initially and has been switched to oral steroids which he will taper as an outpatient. The patient has been advised to continue taking his prednisone 10 mg daily at the end of the steroid taper. He was also advised about CPAP use at bedtime. He carries a diagnosis of congestive heart failure. An echo was checked and it showed mildly reduced systolic function, ejection fraction 50 to 55%. His congestive heart failure was well compensated during the hospitalization. He had mild acute renal  failure which had resolved by the time of discharge. His hemoglobin A1c is 7.7. His diabetes remained well controlled during the hospitalization. He had to be started on Norvasc for hypertension. The patient has chronic shortness of breath, which was at his baseline by the time of discharge. He was evaluated by physical therapy  who recommended Home Health, which was arranged for the patient.   He is being discharged home in stable condition.     TIME SPENT: 45 minutes.  ____________________________ Darrick MeigsSangeeta Britt Theard, MD sp:bjt D: 05/08/2012 12:41:46 ET T: 05/10/2012 11:37:54 ET JOB#: [id number]  cc: Darrick MeigsSangeeta Billyjack Trompeter, MD, <Dictator> Demetrios Isaacsonald E. Sherrie MustacheFisher, MD Darrick MeigsSANGEETA Janye Maynor MD ELECTRONICALLY SIGNED 05/10/2012 12:23

## 2014-12-21 NOTE — H&P (Signed)
PATIENT NAME:  Bill [name], Bill [name] MR#:  [id number] DATE OF BIRTH:  1946-04-12  DATE OF ADMISSION:  06/12/2013  PRIMARY CARE PHYSICIAN: Dr. Chandra BatchFischer.   PULMONOLOGIST: Dr. Meredeth IdeFleming.   CHIEF COMPLAINT: Syncope.   HISTORY OF PRESENT ILLNESS: A 69 year old male patient with history of COPD, chronic respiratory failure on daily prednisone, hypertension, congestive heart failure, presents to the Emergency Room with an episode of syncope earlier today. The patient has been feeling weak, more short of breath over the past 2 months. He did see Dr. Meredeth IdeFleming earlier today, was told there is worsening of his COPD. Later, the patient went for lunch at [employer], finished his lunch, stood up to walk out to his car but near the exit door, the patient passed out briefly for a minute. He did have family with him. Did not have any seizures, incontinence. No tongue bite. Never had syncope in the past. He did feel lightheaded prior to the episode but did not have any chest pain, palpitations. No focal deficit.   Here in the Emergency Room, the patient had his orthostatics checked, which were normal, but his hemoglobin is down to 9, with his last known hemoglobin of 15. Stool positive for blood, although he has not noticed any blood in his stools. No melena. He is on chronic steroids, but not on any PPI. He used to take NSAIDs for his pain, but has been on narcotics for the past few months.   He does mention that he was told he turned pale and blue before he passed out. He did get a CT scan of the chest with IV contrast, which did not show any pulmonary embolism. CT head was normal.   PAST MEDICAL HISTORY: 1.  COPD.  2.  Chronic respiratory failure, on 2 to 3 liters of oxygen.  3.  Diabetes mellitus type 2.  4.  Hyperlipidemia.  5.  Gout.  6.  Diastolic congestive heart failure.  7.  Depression.  8.  Obstructive sleep apnea.   SOCIAL HISTORY: The patient used to smoke in the past, but quit 3 years back. No alcohol.  No illicit drugs. Lives alone. Ambulates on his own, but severely restricted by his shortness of breath.   CODE STATUS: Full code.   ALLERGIES: No known drug allergies.   FAMILY HISTORY: Positive for diabetes and coronary artery disease.   REVIEW OF SYSTEMS:    CONSTITUTIONAL: Complains of fatigue, weakness. No weight loss, weight gain.  EYES: No blurred vision, pain, redness.  EARS, NOSE, THROAT: No tinnitus, ear pain, hearing loss.  RESPIRATORY: Has chronic shortness of breath and wheezing with COPD, dry cough.  CARDIOVASCULAR: No chest pain, orthopnea. Does have mild edema and syncope earlier today.  GASTROINTESTINAL: No nausea, vomiting, diarrhea, abdominal pain.  GENITOURINARY: No dysuria, hematuria, frequency.  ENDOCRINE: No polyuria, nocturia, thyroid problems.  HEMATOLOGIC AND LYMPHATIC: Has anemia and heme-positive stools, but no frank bleeding.  INTEGUMENTARY: No acne, rash, lesions.  MUSCULOSKELETAL: Has arthritis.  NEUROLOGIC: No focal numbness, weakness, seizures.  PSYCHIATRIC: Has depression.   HOME MEDICATIONS:  1.  Advair Diskus 250/50 inhaled 2 times a day.  2.  Albuterol nebulizer 1 to 2 times a day as needed.  3.  Allopurinol 300 mg oral 2 times a day.  4.  Ambien 10 mg oral once a day.  5.  Amlodipine 10 mg oral once a day.  6.  Citalopram 40 mg oral once a day.  7.  Flomax 0.4 mg oral once a day.  8.  Lasix 80 mg oral once a day.  9.  Gabapentin 100 mg oral 2 times a day.  10.  Janumet 50/500 oral 2 times a day. 11.  Klonopin 2 mg oral 3 times a day.  12.  Olanzapine 15 mg oral once a day.  13.  Potassium chloride 1 tablet oral once a day.  14.  Prednisone 20 mg oral once a day.  15.  Primidone 250 mg oral 2 times a day.  16.  Spiriva 18 mcg inhaled once a day.   PHYSICAL EXAMINATION: VITAL SIGNS: Temperature 97.9, pulse of 84, blood pressure 143/94, saturating 97% on oxygen. Orthostatic vitals showed blood pressure lying down of 131/64 and standing  126/59 with a drop of 10 of heart rate.  GENERAL: Morbidly obese Caucasian male patient, sitting up in bed in mild respiratory distress.  PSYCHIATRIC: Alert and oriented x 3. Mood and affect appropriate. Judgment intact.  HEENT: Atraumatic, normocephalic. Oral mucosa moist and pink. Pallor positive. No icterus.  NECK: Supple. No thyromegaly. No palpable lymph nodes. Trachea midline. No carotid bruit, JVD.  CARDIOVASCULAR: S1, S2, without any murmurs. Peripheral pulses 2+. No edema.  RESPIRATORY: Decreased air entry on both sides with expiratory wheezing. Does have increased work of breathing.  GASTROINTESTINAL: Soft abdomen. Tenderness in the epigastric area. Bowel sounds present. No hepatosplenomegaly palpable.  GENITOURINARY: No CVA tenderness.  SKIN: Warm and dry. No petechiae, rash, ulcers.  MUSCULOSKELETAL: No joint swelling, redness, effusion of the large joints. Normal muscle tone.  NEUROLOGICAL: Motor strength 5/5 in upper and lower extremities.  LYMPHATIC: No cervical lymphadenopathy.   LABORATORY, DIAGNOSTIC AND RADIOLOGICAL DATA: Glucose 165. BNP of 356. BUN 18, creatinine 1.29, sodium 134, potassium 3.9. AST, ALT, alkaline phosphatase, bilirubin normal. Troponin less than 0.02. WBC 18.4, hemoglobin 9.1, platelets 286 with MCV of 82.   Urinalysis shows trace bacteria, but only 2 WBC.   EKG shows sinus tachycardia up to 110 with a right bundle branch block.   CT scan of the head without contrast showed no acute abnormalities.   CT of the chest with IV contrast showed no pulmonary embolism, mild CHF.   Chest PA and lateral showed no acute infiltrates or pulmonary edema.   ASSESSMENT AND PLAN: 1.  Syncope: Suspect the patient's syncope is likely secondary to acute on chronic anemia. The patient's last low hemoglobin had been in the range of 15 to 16, but presently is significantly lower, almost half of what he normally had at 9. His stool is positive for blood. The patient does  have MCV on the lower side at 82. Will check iron studies along with ferritin, put him on intravenous Protonix. I do not feel like he needs any blood transfusion at this time. Will consult gastroenterology for an EGD, as I suspect the patient might have peptic ulcer disease with his chronic steroid use and not being on PPIs. He also has some mild epigastric tenderness. Although doing an EGD in this patient might not be easy considering his respiratory status. Will put him on around-the-clock breathing treatments, try to fine-tune him for his EGD. Hold aspirin. No heparin products. Monitor hemoglobin. Blood transfusion if hemoglobin less than 7.  2.  Chronic obstructive pulmonary disease: The patient does seem to be worsening progressively with his chronic obstructive pulmonary disease. He does have wheezing at this time and this seems to be chronic. He is saturating 99% on oxygen. Will continue his home prednisone, put him on breathing treatments and monitor.  3.  Congestive heart failure, diastolic: Stable.  4.  Leukocytosis: Could be secondary to his bronchitis. Will put him on Levaquin.  5.  Diabetes mellitus type 2: Continue home medications and sliding scale insulin.  6.  Chronic back pain: Continue pain medications.  7.  Deep vein thrombosis prophylaxis with sequential compression devices.   CODE STATUS: Full code.   TIME SPENT TODAY ON THIS CASE: 45 minutes.  ____________________________ Molinda Bailiff Olliver Boyadjian, MD srs:jm D: 06/12/2013 20:44:06 ET T: 06/12/2013 21:42:17 ET JOB#: [id number]  cc: Wardell Heath R. Orpheus Hayhurst, MD, <Dictator> Demetrios Isaacs. Sherrie Mustache, MD Herbon E. Meredeth Ide, MD Orie Fisherman MD ELECTRONICALLY SIGNED 06/28/2013 13:12

## 2014-12-21 NOTE — Consult Note (Signed)
Brief Consult Note: Diagnosis: IDA, heme postitive stool, weakness, syncope.   Consult note dictated.   Recommend to proceed with surgery or procedure.   Orders entered.   Discussed with Attending MD.   Comments: Anemia declined from 9.1 to 7.6 overnight. MCV 81. No overt GI bleeding but pt was heme positive. He denies abdominal pain, and on PE he is not tender to palpation. No n/v.  Baseline hgb from 2013 appears to be around 16. Patient is very sleepy and falls asleep throughout my interaction with him.  Due to acute IDA with heme positive stool, we recommend endoscopic intervention. Patient refuses colonoscopy. We can proceed with just EGD this afternoon. Continue PPI. Keep pt NPO and consent for EGD with Dr Shelle Ironein. Orders have been placed.   full consult being dictated.  Electronic Signatures: Brantley StageEarle, Shean Gerding M (PA-C)  (Signed 14-Oct-14 15:28)  Authored: Brief Consult Note   Last Updated: 14-Oct-14 15:28 by Ashok CordiaEarle, Steele Stracener M (PA-C)

## 2014-12-21 NOTE — Consult Note (Signed)
PATIENT NAME:  Bill [name], Bill [name] MR#:  [id number] DATE OF BIRTH:  15-May-1946  DATE OF CONSULTATION:  06/13/2013  REFERRING PHYSICIAN:  Milagros Loll, MD CONSULTING PHYSICIAN:  Dow Adolph, MD / Hardie Shackleton. Montrae Braithwaite, PA-C  REASON FOR CONSULTATION: Iron deficiency anemia and heme-positive stool.   HISTORY OF PRESENT ILLNESS: This is a pleasant 69 year old gentleman who initially presented to the hospital yesterday following a syncopal episode outside of [employer]. He saw Dr. Meredeth Ide, his pulmonologist, earlier that morning who had told him that his COPD was worsening. He began feeling extremely weak and a little bit short of breath and ended up passing out. He was taken to the hospital where his initial hemoglobin was found to be 9.1 with a MCV of 82. Looking back on old records from last year, his hemoglobin was 16. Vital signs were stable and he was admitted for further evaluation. He was started on around the clock respiratory treatments to improve his pulmonary status and when his hemoglobin was rechecked this morning it had declined down to 7.6 with a MCV of 81. Iron studies obtained do show a low serum iron of 59. TIBC was normal at 439. Ferritin was 33, which is the lower limits of normal. White blood cells, troponins and liver function have remained within normal limits. Glucose was within normal limits. The patient states that he has never had an EGD or colonoscopy. He denies noticing any overt GI bleeding, but he was found to be heme-positive. There is no nausea or vomiting. Per report there was some epigastric abdominal tenderness, but on my exam today he is denying any pain or tenderness. There is no diarrhea, constipation, bright red blood per rectum or melena that he has been able to identify. His only complaint is weakness.   PAST MEDICAL HISTORY:  1.  COPD. 2.  Hypertension. 3. Congestive heart failure. 4.  Diabetes mellitus type 2. 5.  Gout. 6.  Dyslipidemia. 7.  Depression. 8.   Obstructive sleep apnea.   HOME MEDICATIONS: Advair, albuterol, allopurinol, Ambien, amlodipine, citalopram, Flomax, Lasix, gabapentin, Janumet, Klonopin, olanzapine, potassium chloride, prednisone, primidone, Spiriva.  FAMILY HISTORY: There is no known family history of GI malignancy, colon polyps or IBD.   SOCIAL HISTORY: The patient does have a remote history of an extensive amount of tobacco use, but quit 3 years ago. No current alcohol, tobacco or illicit drug use.   ALLERGIES: PENICILLIN.   REVIEW OF SYSTEMS:  A 10 system review of systems was obtained on the patient. Pertinent positives are mentioned above and are otherwise negative.   PHYSICAL EXAMINATION: VITAL SIGNS: Blood pressure 120/69, heart rate 79, respirations 19, temperature 98 degrees, bedside pulse ox 97%.  GENERAL: This is a pleasant 69 year old gentleman resting quietly and comfortably in bed, in no acute distress. He is alert but difficult to tell if he is oriented because he is quite difficult to arouse and does fall asleep multiple times throughout my discussion with him.  HEAD: Atraumatic, normocephalic.  NECK: Supple. No lymphadenopathy noted.  HEENT: Sclerae anicteric. Mucous membranes moist.  LUNGS: Respirations are even and unlabored. Bilateral wheezes are noted throughout. Clear to auscultation in bilateral anterior lung fields.  HEART:  Regular rate and rhythm. S1 and S2 noted.  ABDOMEN: Soft, nontender, nondistended. Normoactive bowel sounds noted in all 4 quadrants. No masses palpated. No guarding or rebound. PSYCH: Appropriate mood and affect, although he is extremely fatigued and tired and difficult to arouse.  EXTREMITIES: Negative for lower extremity edema, 2+  pulses noted in bilateral upper extremities.   LABORATORY AND DIAGNOSTICS: White blood cells 11, hemoglobin 7.6 down from 9.1, hematocrit 23.4, platelets 256, MCV 81. Iron 59, TIBC 439, ferritin 33, iron sat 13. Sodium 134, potassium 3.9, BUN 18,  creatinine 1.29, glucose 165. Troponin 0.02. LFTs are within normal limits. BNP 356.   Imaging: EKG was obtained upon initial presentation, notable for sinus tachycardia and a right bundle branch block.   CT scan of the head without contrast was obtained and was negative.   CT scan of the chest was obtained on the patient showing no evidence of a pulmonary embolism and there was mild congestive heart failure.   A chest x-ray was obtained on the patient showing no evidence of an acute abnormality.   ASSESSMENT: 1.  Iron deficiency anemia.  2.  Heme-positive stool.  3.  Worsening chronic obstructive pulmonary disease with some respiratory distress.  4.  Syncopal episode yesterday.  5.  Obstructive sleep apnea.   PLAN: I have discussed this patient's case in detail with Dr. Dow AdolphMatthew Rein who is involved in the development of the patient's plan of care. At this time, because of his acute onset iron deficiency anemia with a current hemoglobin of 7.6 with accompanying heme-positive stool, we do feel that endoscopic intervention would be necessary at this point. The patient is refusing a colonoscopy despite hearing the risks and benefits, but he has agreed to undergoing an EGD. Therefore, the patient has already been n.p.o. and we can proceed with this procedure this afternoon with anesthesia. We do agree with continuing IV PPI therapy as well as checking serial hemoglobins and being prepared to transfuse as necessary. We do appreciate Dr. Meredeth IdeFleming and pulmonology's input regarding his respiratory status as well. Alternatives, risks and benefits of the EGD were discussed and the patient verbalized understanding and is in agreement to proceed. All questions were answered. Further recommendations will be pending the endoscopic intervention and per clinical course.   Thank you so much for this consultation and for allowing us to participate in the patient's plan of care.  This services provided by Hardie ShackletonKaryn M.  Avis Mcmahill, PA-C under collaborative agreement with Dr. Dow AdolphMatthew Rein. ____________________________ Hardie ShackletonKaryn M. Keisuke Hollabaugh, PA-C kme:sb D: 06/13/2013 15:41:12 ET T: 06/13/2013 16:16:42 ET JOB#: [id number]  cc: Hardie ShackletonKaryn M. Marthella Osorno, PA-C, <Dictator> Hardie ShackletonKARYN M Britain Anagnos PA ELECTRONICALLY SIGNED 06/14/2013 12:19

## 2014-12-21 NOTE — Discharge Summary (Signed)
PATIENT NAME:  Bill [name], Bill [name] MR#:  [id number] DATE OF BIRTH:  Jul 25, 1946  DATE OF ADMISSION:  06/12/2013 DATE OF DISCHARGE:  06/16/2013  PRIMARY CARE PHYSICIAN: Dr. Sherrie MustacheFisher.   DISCHARGE DIAGNOSES: 1.  Acute on chronic blood loss anemia with iron deficiency.  2.  Gastrointestinal bleed, likely secondary from peptic ulcer disease due to chronic steroid use.  3.  Syncope.  4.  Acute encephalopathy secondary to medications.  5.  Chronic obstructive pulmonary disease with chronic respiratory failure on 4 liters oxygen.  6.  Chronic diastolic congestive heart failure.  7.  Diabetes mellitus type 2.  8.  Morbid obesity.   CONSULTANTS: Dr. Shelle Ironein of GI.   THE IMAGING STUDIES DONE:  1.  Include a CT scan of the head without contrast, which showed no acute abnormalities. No bleed. No mass.  2.  CT angiography of the chest that showed no PE.  3.  Chest x-ray PA and lateral, was normal with some cardiomegaly.   ADMITTING HISTORY AND PHYSICAL: Please see detailed H and P dictated previously on 06/12/2013. In brief, a 69 year old patient with history of advanced COPD, chronic respiratory failure on daily prednisone, hypertension, congestive heart failure, presented to the Emergency Room with an episode of syncope which was very brief, without any seizures or incontinence. The patient was found to have significant anemia, drop from 15 to 9 by admission time and admitted to the hospitalist service for further work-up and treatment.   HOSPITAL COURSE: 1.  Syncope. The patient's syncope was thought to be secondary to acute anemia. The patient did have a brief episode of being blue, cyanotic prior to syncope for which a CT of the chest was done which showed no PE. Orthostatics were negative. His syncope was secondary to anemia.  2.  Acute on chronic iron deficiency anemia with GI bleed. The patient did have stool occult positive. The patient has refused an EGD and colonoscopy as recommended by GI. His  hemoglobin seems to be stable at this time between 7 to 7.5, started on iron supplementation. Presently, the patient mentions that he would not want any procedures done but if it gets worse, he would consider blood transfusion and the EGD, colonoscopy. Although with his breathing problems, the patient with definite need these procedures with great caution.  3. COPD with chronic respiratory failure has been stable.  We are continuing his prednisone as before. But secondary to concern for possible gastritis or peptic ulcer disease with chronic steroid therapy, he was started on b.i.d. PPIs.  4.  Today, the patient is doing well. He does have chronic shortness of breath, which is stable. On examination, he does not have wheezing. Good air entry. No abdominal tenderness on exam.   DISCHARGE MEDICATIONS: Include:  1.  Janumet 50/500 oral 2 times a day.  2.  Albuterol inhalation 2 puffs inhaled 1 to 2 times a day as needed.  3.  Albuterol nebulizer 1 to 2 times a day as needed for shortness of breath.  4.  Citalopram 40 mg oral once a day.  5.  Prednisone 10 mg oral once a day.  6.  Spiriva 18 mcg inhaled once a day.  7.  Gabapentin 100 mg oral 2 times a day.  8.  Advair Diskus 250/50, one puff inhaled 2 times a day.  9.  Amlodipine 10 mg oral once a day. 10.  Flomax 0.4 mg oral once a day.  11.  Ambien 10 mg oral once a day.  12.  Olanzapine 15 mg oral once a day.  13.  Primidone 250 mg oral 2 times a day.  14.  Lasix 80 mg oral once a day.  15.  Potassium chloride 1 tablet oral once a day.  16.  Allopurinol 300 mg oral 2 times a day.  17.  Klonopin 2 mg oral 3 times a day as needed.  18.  Protonix 40 mg oral 2 times a day.  19.  Ferrous sulfate 325 mg oral 3 times a day with meals.   DISCHARGE INSTRUCTIONS: Low-sodium carbohydrate-controlled diet. Activity as tolerated with assistance. The patient will be discharged to SNF as recommended by physical therapy on working with the patient. Follow up  with Dr. Sherrie Mustache in 1 to 2 weeks. The patient needs to return to Emergency Room if he has any black stools or bloody stools or worsening anemia.   Time spent today on this case was 40 minutes with more than 50% time spent in coordination of care.    ____________________________ Molinda Bailiff. Kassadie Pancake, MD srs:dp D: 06/16/2013 10:00:19 ET T: 06/16/2013 10:17:30 ET JOB#: [id number]  cc: Wardell Heath R. Tearsa Kowalewski, MD, <Dictator> Demetrios Isaacs. Sherrie Mustache, MD Orie Fisherman MD ELECTRONICALLY SIGNED 06/28/2013 13:10

## 2014-12-21 NOTE — Consult Note (Signed)
I have seen and examined Bill [name] and agree with Wilhelmenia BlaseKaryn Earle's plan.  has an IDA which is new compared to labs one year ago.   is currently very altered and has a hard time keeping his eyes open.  This woud not be the best time to perform non-urgent endoscpy given his hgb is stable and no overt bleeding.  paln to perform EGD once he is stable from mental status standpoint,  and would also perform colonoscopy if he we agreeable which he was not earlier.  the meantime, would cont to monitor h/h, hemodnamics and continue to w/u ams.   Electronic Signatures: Dow Adolphein, Merisa Julio (MD)  (Signed on 14-Oct-14 17:37)  Authored  Last Updated: 14-Oct-14 17:37 by Dow Adolphein, Urban Naval (MD)

## 2014-12-21 NOTE — Consult Note (Signed)
Ms Bill [name] is much more alert today. No bleeding. No abd pain . Is SOB, + cough.  No diarrhea   and ox 4, nadmild course breath soundsno m/r/gobese, nt,nd,nabsno swelling, well perf.   discussed w/u of the iron defeciency anemia with him and he is currently declining egd or colonscopy. He states he has too many other problems right now such as his breathing difficulties. He would re-consider if he were to develop overt bleeding. I did tell him that we could miss a cancer by not doing the procedure or he could develop severe bleeding and he understood.  He would still like to hold off on procedures.   Would recommend ferrous sulfate 325 BID, monitoring of Hgb and iron studies as an outpatient. [facility] if he changes his mind about undergoing these procedures.GI will sign off, please call with any questions.    Electronic Signatures: Dow Adolphein, Ocie Stanzione (MD)  (Signed on 15-Oct-14 16:25)  Authored  Last Updated: 15-Oct-14 16:25 by Dow Adolphein, Jane Broughton (MD)

## 2014-12-22 NOTE — Discharge Summary (Signed)
Dates of Admission and Diagnosis:  Date of Admission 30-Mar-2014   Date of Discharge 03-Apr-2014   Admitting Diagnosis ARF   Final Diagnosis 1. ARF 2. Chronic diastolic chf 3. Afib 4. HTN 5. Gout 6. Debility 7. Chronic resp failure    Chief Complaint/History of Present Illness CHIEF COMPLAINT: Shortness of breath and altered mental status.   HISTORY OF PRESENT ILLNESS: This is a 69 year old male who presented to the Emergency Room recently from an eye doctor's appointment, as he was having some shortness of breath there.  He presented to the Emergency Room and was noted to have ST elevations in the inferior leads and was also noted to be acutely hypotensive with systolic blood pressures in the 70s. He was thought to have an ST-elevation MI and therefore urgently rushed to the catheterization laboratory. The patient underwent a cardiac catheterization which showed no significant coronary artery disease, but global LV dysfunction, EF of 40%. Post catheter, the patient still remains lethargic and confused. Therefore, hospitalist services were contacted for admission. The patient presently denies any chest pain, any shortness of breath, nausea, vomiting, abdominal pain, or any other associated symptoms presently. The patient, himself, is quite lethargic and therefore a poor historian.   Allergies:  Penicillin: N/V/Diarrhea  Amoxicillin: N/V/Diarrhea  Triplex AD: Unknown  Cardiology:  04-Aug-15 08:41   Ventricular Rate 65  Atrial Rate 208  QRS Duration 132  QT 448  QTc 465  R Axis -69  T Axis 64  ECG interpretation Atrial fibrillation Right bundle branch block Left anterior fascicular block *** Bifascicular block *** Abnormal ECG When compared with ECG of 30-Mar-2014 15:59, Previous ECG has undetermined rhythm, needs review (RBBB and left anterior fascicular block) is now Present ----------unconfirmed---------- Confirmed by OVERREAD, NOT ([id number]), editor PEARSON, BARBARA ([id number]) on  04/03/2014 10:16:32 AM  Routine Chem:  03-Aug-15 06:38   Glucose, Serum  135  BUN  82  Creatinine (comp) 1.09  Sodium, Serum 140  Potassium, Serum 3.7  Chloride, Serum 101  CO2, Serum  34  Calcium (Total), Serum 9.3  Anion Gap  5  Osmolality (calc) 306  eGFR (African American) >60  eGFR (Non-African American) >60 (eGFR values <70m/min/1.73 m2 may be an indication of chronic kidney disease (CKD). Calculated eGFR is useful in patients with stable renal function. The eGFR calculation will not be reliable in acutely ill patients when serum creatinine is changing rapidly. It is not useful in  patients on dialysis. The eGFR calculation may not be applicable to patients at the low and high extremes of body sizes, pregnant women, and vegetarians.)  04-Aug-15 05:04   Glucose, Serum  139  BUN  64  Creatinine (comp) 0.90  Sodium, Serum 139  Potassium, Serum  3.1  Chloride, Serum 98  CO2, Serum  34  Calcium (Total), Serum 9.5  Anion Gap 7  Osmolality (calc) 298  eGFR (African American) >60  eGFR (Non-African American) >60 (eGFR values <684mmin/1.73 m2 may be an indication of chronic kidney disease (CKD). Calculated eGFR is useful in patients with stable renal function. The eGFR calculation will not be reliable in acutely ill patients when serum creatinine is changing rapidly. It is not useful in  patients on dialysis. The eGFR calculation may not be applicable to patients at the low and high extremes of body sizes, pregnant women, and vegetarians.)  Magnesium, Serum 2.4 (1.8-2.4 THERAPEUTIC RANGE: 4-7 mg/dL TOXIC: > 10 mg/dL  -----------------------)  Routine Coag:  03-Aug-15 10:35   Prothrombin  21.1  INR  1.9 (INR reference interval applies to patients on anticoagulant therapy. A single INR therapeutic range for coumarins is not optimal for all indications; however, the suggested range for most indications is 2.0 - 3.0. Exceptions to the INR Reference Range may include:  Prosthetic heart valves, acute myocardial infarction, prevention of myocardial infarction, and combinations of aspirin and anticoagulant. The need for a higher or lower target INR must be assessed individually. Reference: The Pharmacology and Management of the Vitamin K  antagonists: the seventh ACCP Conference on Antithrombotic and Thrombolytic Therapy. SNKNL.9767 Sept:126 (3suppl): N9146842. A HCT value >55% may artifactually increase the PT.  In one study,  the increase was an average of 25%. Reference:  "Effect on Routine and Special Coagulation Testing Values of Citrate Anticoagulant Adjustment in Patients with High HCT Values." American Journal of Clinical Pathology 2006;126:400-405.)   Pertinent Past History:  Pertinent Past History CHF GOUT CKD3 HTN   Hospital Course:  Hospital Course 1. AMS, possible metabolic encephalopathy:  improved. Due to ARF. neg CT head, normal Ammonia.   2. Acute Renal Failure - likely pre-renal and related to dehydration.  Cr. improved to nomral range, BUN is better. d/c  NS IV fluids, encourage oral fluid intake per Dr. Rockey Situ,  f/u BMP. Resume Torsemide at 20 BID at d/c  * Hypokalemia: improved.  3. COPD with chronic respiratory failure: stable, cont O2 [place] 4L, cont  Symbicort, Spiriva, PRN duonebs.   4. BPH - cont. Flomax.    5. Depression - resume depression meds.  6. DM - SSI, BS is controlled,  hold levemir.  7. Chronic diastolic CHF (HA19-37%):  stable,  hold diuretics.   8. No STEMI - s/p cath showing no significant CAD.  - cont. ASA. f/u Echo: EF 40-45%.   9. Coagulopathy - INR is 1.7.  * left hand gout?: check uric acid 10.9 ,  No NSAIDs. Prednisone given in hospital  Today back to baseline and is requesting to be discharged  F/U Cardiology Dr. Rockey Situ  Time spent on discharge 40 min   Condition on Discharge Fair   Code Status:  Code Status No Code/Do Not Resuscitate   PHYSICAL EXAM ON DISCHARGE:  Physical  Exam:  GEN obese   NECK supple  No masses   RESP normal resp effort  clear BS   CARD irregular rate  No LE edema   ABD soft   VITAL SIGNS:  Vital Signs: **Vital Signs.:   04-Aug-15 05:01  Vital Signs Type Routine  Temperature Temperature (F) 97.5  Celsius 36.3  Temperature Source oral  Pulse Pulse 58  Respirations Respirations 18  Systolic BP Systolic BP 902  Diastolic BP (mmHg) Diastolic BP (mmHg) 70  Mean BP 92  Pulse Ox % Pulse Ox % 99  Pulse Ox Activity Level  At rest  Oxygen Delivery 4L    11:10  Vital Signs Type Routine  Temperature Temperature (F) 97.8  Celsius 36.5  Temperature Source oral  Pulse Pulse 79  Respirations Respirations 18  Systolic BP Systolic BP 409  Diastolic BP (mmHg) Diastolic BP (mmHg) 74  Mean BP 91  Pulse Ox % Pulse Ox % 94  Pulse Ox Activity Level  At rest  Oxygen Delivery 4L   DISCHARGE INSTRUCTIONS HOME MEDS:  Medication Reconciliation: Patient's Home Medications at Discharge:     Medication Instructions  citalopram 40 mg oral tablet  1 tab(s) orally once a day   spiriva 18 mcg inhalation capsule  1 each inhaled once a day  gabapentin 100 mg oral capsule  1 cap(s) orally 2 times a day   primidone 250 mg oral tablet  1 tab(s) orally 2 times a day   pantoprazole 40 mg oral delayed release tablet  1 tab(s) orally 2 times a day   symbicort 160 mcg-4.5 mcg/inh inhalation aerosol  2 puff(s) inhaled 2 times a day   tamsulosin 0.4 mg oral capsule  1 cap(s) orally once a day   allopurinol 300 mg oral tablet  1 tab(s) orally once a day   tramadol 50 mg oral tablet  1 tab(s) orally every 12 hours, As Needed for pain   ventolin cfc free 90 mcg/inh inhalation aerosol  2 puff(s) inhaled 4 times a day   cranberry - oral capsule  1 cap(s) orally 2 times a day   isosorbide mononitrate 30 mg oral tablet, extended release  1 tab(s) orally once a day (in the morning)   preservision antioxidant multiple vitamins and minerals oral capsule  1 cap(s)  orally once a day   novolog 100 units/ml subcutaneous solution  5 unit(s) subcutaneous 4 times a day (before meals and at bedtime) for BG >/= 150   olanzapine 15 mg oral tablet  1 tab(s) orally once a day   probiotic formula - oral capsule  1 cap(s) orally 2 times a day   fluticasone nasal 50 mcg/inh nasal spray  2 spray(s) nasal once a day   ipratropium 500 mcg/2.5 ml inhalation solution  2.5 milliliter(s) inhaled 2 times a day   aspirin 81 mg oral tablet, chewable  1 tab(s) orally once a day   potassium chloride 20 meq oral tablet, extended release  1 tab(s) orally once a day   ilevro 0.3% ophthalmic suspension  1 drop(s) to left eye once a day3-5 minutes bewteen each drop   hydroxyzine hydrochloride hydrochloride 25 mg oral tablet  1 tab(s) orally 3 times a day, As Needed itching   senna 8.6 mg oral tablet  2 tab(s) orally once a day (at bedtime)   colchicine 0.6 mg oral capsule  1 cap(s) orally once a day, As Needed   hydrophor - topical ointment  Apply topically to affected area 2 times a day, As Needed to heels   baza antifungal 2% topical cream  Apply topically to affected area 2 times a day to groin before and after bath   metolazone 2.5 mg oral tablet  1 tab(s) orally once a day M, W, F for CHF   clonazepam 1 mg oral tablet  1 tab(s) orally 2 times a day, As Needed for anxiety   [id number] - oral powder for reconstitution  1 dose(s) orally once a day mix 17 G with 4-8 ounces of water and drink    prednisone 5 mg oral tablet  1 tab(s) orally once a day for COPD with food   tradjenta 5 mg oral tablet  1 tab(s) orally once a day   spironolactone 50 mg oral tablet  1 tab(s) orally 2 times a day   hydrocodone/apap 5/325  1 tab(s) orally every 4 hours, As Needed for moderate pain   acetaminophen 325 mg oral tablet  2 tab(s) orally every 4 hours for mild pain As needed   metoprolol tartrate 25 mg oral tablet  0.5 tab(s) orally 2 times a day   torsemide 20 mg oral tablet  1 tab(s) orally 2 times  a day   apixaban 5 mg oral tablet  1 tab(s) orally 2 times a day  levemir 100 units/ml subcutaneous solution  10 unit(s) subcutaneous once a day   ipratropium 500 mcg/2.5 ml inhalation solution  2.5 milliliter(s) inhaled 4 times a day    STOP TAKING THE FOLLOWING MEDICATION(S):    ambien 10 mg oral tablet: 1 tab(s) orally once a day (at bedtime), As Needed - for Inability to Sleep  Physician's Instructions:  Home Oxygen? Yes   Oxygen delivery at home: 1L   Diet Low Sodium  Carbohydrate Controlled (ADA) Diet   Activity Limitations As tolerated   Return to Work Not Applicable   Time frame for Follow Up Appointment 1-2 weeks  Dr. Rockey Situ   Time frame for Follow Up Appointment 1-2 weeks  PCP   Electronic Signatures: Alba Destine (MD)  (Signed 04-Aug-15 11:58)  Authored: ADMISSION DATE AND DIAGNOSIS, CHIEF COMPLAINT/HPI, Allergies, PERTINENT LABS, PERTINENT PAST HISTORY, HOSPITAL COURSE, PHYSICAL EXAM ON [place], PATIENT INSTRUCTIONS   Last Updated: 04-Aug-15 11:58 by Alba Destine (MD)

## 2014-12-22 NOTE — Op Note (Signed)
PATIENT NAME:  Bill [name], Bill [name] MR#:  [id number] DATE OF BIRTH:  [birth year]  DATE OF PROCEDURE:  01/02/2014  PREOPERATIVE DIAGNOSIS: Visually significant cataract of the left eye.   POSTOPERATIVE DIAGNOSIS: Visually significant cataract of the left eye.   OPERATIVE PROCEDURE: Cataract extraction by phacoemulsification with implant of intraocular lens to left eye.   SURGEON: Galen ManilaWilliam Anitria Andon, MD.   ANESTHESIA:  1. Managed anesthesia care.  2. Topical tetracaine drops followed by 2% Xylocaine jelly applied in the preoperative holding area.   COMPLICATIONS: None.   TECHNIQUE:  Stop and chop.    DESCRIPTION OF PROCEDURE: The patient was examined and consented in the preoperative holding area where the aforementioned topical anesthesia was applied to the left eye and then brought back to the Operating Room where the left eye was prepped and draped in the usual sterile ophthalmic fashion and a lid speculum was placed. A paracentesis was created with the side port blade and the anterior chamber was filled with viscoelastic. A near clear corneal incision was performed with the steel keratome. A continuous curvilinear capsulorrhexis was performed with a cystotome followed by the capsulorrhexis forceps. Hydrodissection and hydrodelineation were carried out with BSS on a blunt cannula. The lens was removed in a stop and chop  technique and the remaining cortical material was removed with the irrigation-aspiration handpiece. The capsular bag was inflated with viscoelastic and the Tecnis ZCB00 22.0-diopter lens, serial number [id number] was placed in the capsular bag without complication. The remaining viscoelastic was removed from the eye with the irrigation-aspiration handpiece. The wounds were hydrated. The anterior chamber was flushed with Miostat and the eye was inflated to physiologic pressure. 0.1 mL of cefuroxime concentration 10 mg/mL was placed in the anterior chamber. The wounds were found to be water  tight. The eye was dressed with Vigamox. The patient was given protective glasses to wear throughout the day and a shield with which to sleep tonight. The patient was also given drops with which to begin a drop regimen today and will follow-up with me in one day.    ____________________________ Jerilee FieldWilliam L. Shiryl Ruddy, MD wlp:lt D: 01/02/2014 22:38:00 ET T: 01/03/2014 05:01:26 ET JOB#: [id number]  cc: Dakotah Orrego L. Tongela Encinas, MD, <Dictator> Jerilee FieldWILLIAM L Arieh Bogue MD ELECTRONICALLY SIGNED 01/11/2014 13:28

## 2014-12-22 NOTE — Consult Note (Signed)
PATIENT NAME:  Bill [name], Bill [name] MR#:  [id number] DATE OF BIRTH:  [birth year]  DATE OF CONSULTATION:  03/30/2014  REFERRING PHYSICIAN:   CONSULTING PHYSICIAN:  Donovon Micheletti A. Kirke Corin, MD  REASON FOR CONSULTATION: Possible inferior ST-elevation myocardial infarction.   HISTORY OF PRESENT ILLNESS: This is a 69 year-old male with known history of COPD, sleep apnea, obesity, encephalopathy, chronic kidney disease and recent cataract surgery. The patient went for routine postoperative check-up at [facility]. While he was sitting in the waiting area, he was noted to be short of breath with confusion. He might have had a brief loss of consciousness. EMS was called. He was noted to be hypotensive with a blood pressure in the 90s/50. He was also hypoxic and was placed on oxygen. He was transferred to the Emergency Room. EKG showed 2 mm of inferior ST elevation with right bundle branch block. The patient was complaining of generalized pain with shortness of breath. He was also slightly lethargic. He was suspected of having an inferior ST elevation myocardial infarction with possible RV involvement and hypotension. Thus, emergent cardiac catheterization was recommended.   PAST MEDICAL HISTORY:  1. Back pain.  2. Chronic obstructive pulmonary disease.  3. Congestive heart failure.  4. Depression.  5. Encephalopathy.  6. Hyperlipidemia.  7. Hypertension.  8. Sleep apnea.  9. Syncope.  10. Type 2 diabetes.   HOME MEDICATIONS: Symbicort, citalopram, gabapentin, insulin, tamsulosin, Spiriva, acetaminophen, tramadol, Ventolin, isosorbide mononitrate 30 mg daily, Atrovent inhaler, olanzapine, aspirin, hydroxyzine, torsemide 20 mg 3 times daily, colchicine 0.6 mg as needed, metolazone 2.5 mg 3 times a week, allopurinol 300 mg daily, Protonix 40 mg daily, prednisone 5 mg daily, spironolactone 50 mg daily, hydrocodone as needed, Ambien.   SOCIAL HISTORY: Unable to obtain.   FAMILY HISTORY: Unable to obtain due  to the patient's mental status.   REVIEW OF SYSTEMS: Unable to obtain due to the patient's mental status.   PHYSICAL EXAMINATION:  GENERAL: The patient was lethargic and dyspneic.  VITAL SIGNS: On presentation revealed a blood pressure of 96/60. Oxygen saturation 100% on a nonrebreather.  HEENT: Normocephalic, atraumatic.  NECK: No jugular venous distention or carotid bruits.  RESPIRATORY: Normal respiratory effort with diminished breath sounds throughout.  CARDIOVASCULAR: The PMI is not palpable with distant heart sounds. No gallops or murmurs are appreciated.  ABDOMEN: Benign, nontender and nondistended.  EXTREMITIES: With trace edema bilaterally.  SKIN: Warm and dry with no rash.  PSYCHIATRIC: The patient is lethargic, but responsive.   LABORATORY AND DIAGNOSTIC DATA: ECG showed sinus rhythm with 2 mm of inferior ST elevation with Q waves in II and aVF.   His laboratories came back with a creatinine of 1.8, with a BUN of 134. His albumin was 2.6. Troponin initially was less than 0.02. White cell count was 16,000, with a hemoglobin of 12.1. INR was 2.   IMPRESSION:  1. Acute respiratory failure of unclear etiology, but unlikely to be cardiac. The patient was suspected initially of having inferior ST-elevation myocardial infarction; however, emergent cardiac catheterization was done via the right radial artery, which showed no evidence of obstructive coronary artery disease. He did have some focal wall motion abnormalities in the basal inferior wall. Thus, the possibilities include old inferior myocardial infarction with continuous recanalization. The other possibility is acute infarct with spontaneous recanalization after heparin was given. His left ventricular end-diastolic pressure was only 15, and he does not seem to be in significant heart failure. I discussed the case with Dr.  Sainani. He will be admitting the patient to internal medicine. The patient will require a CT scan of the head  due to encephalopathy, as well as a chest x-ray. 2. Acute on chronic renal failure with significantly elevated BUN of unclear etiology. Recommend nephrology consult.  3. Diabetes.    ____________________________ Chelsea AusMuhammad A. Kirke CorinArida, MD maa:jr D: 03/30/2014 17:24:33 ET T: 03/30/2014 18:11:19 ET JOB#: [id number]  cc: Tishie Altmann A. Kirke CorinArida, MD, <Dictator> Iran OuchMUHAMMAD A Nolie Bignell MD ELECTRONICALLY SIGNED 04/01/2014 12:09

## 2014-12-22 NOTE — Op Note (Signed)
PATIENT NAME:  Bill MeekerBOGGS, Jeremia C MR#:  [id number] DATE OF BIRTH:  Nov 05, 1945  DATE OF PROCEDURE:  03/20/2014  PREOPERATIVE DIAGNOSIS: Visually significant cataract of the right eye.   POSTOPERATIVE DIAGNOSIS: Visually significant cataract of the right eye.   OPERATIVE PROCEDURE: Cataract extraction by phacoemulsification with implant of intraocular lens to right eye.   SURGEON: Galen ManilaWilliam Aedan Geimer, MD.   ANESTHESIA:  1. Managed anesthesia care.  2. Topical tetracaine drops followed by 2% Xylocaine jelly applied in the preoperative holding area.   COMPLICATIONS: None.   TECHNIQUE:  Stop and chop.   DESCRIPTION OF PROCEDURE: The patient was examined and consented in the preoperative holding area where the aforementioned topical anesthesia was applied to the right eye and then brought back to the Operating Room where the right eye was prepped and draped in the usual sterile ophthalmic fashion and a lid speculum was placed. A paracentesis was created with the side port blade and the anterior chamber was filled with viscoelastic. A near clear corneal incision was performed with the steel keratome. A continuous curvilinear capsulorrhexis was performed with a cystotome followed by the capsulorrhexis forceps. Hydrodissection and hydrodelineation were carried out with BSS on a blunt cannula. The lens was removed in a stop and chop technique and the remaining cortical material was removed with the irrigation-aspiration handpiece. The capsular bag was inflated with viscoelastic and the Tecnis ZCB00-22.0-diopter lens, serial number [id number] was placed in the capsular bag without complication. The remaining viscoelastic was removed from the eye with the irrigation-aspiration handpiece. The wounds were hydrated. The anterior chamber was flushed with Miostat and the eye was inflated to physiologic pressure. 0.1 mL of cefuroxime concentration 10 mg/mL was placed in the anterior chamber. The wounds were found to be  water tight. The eye was dressed with Vigamox. The patient was given protective glasses to wear throughout the day and a shield with which to sleep tonight. The patient was also given drops with which to begin a drop regimen today and will follow up with me in one day.    ____________________________ Jerilee FieldWilliam L. Eduard Penkala, MD wlp:lt D: 03/21/2014 07:54:53 ET T: 03/21/2014 09:06:24 ET JOB#: [id number]  cc: Caelin Rosen L. Sherrey North, MD, <Dictator> Jerilee FieldWILLIAM L Nattaly Yebra MD ELECTRONICALLY SIGNED 03/28/2014 17:11

## 2014-12-22 NOTE — H&P (Signed)
PATIENT NAME:  Bill [name], Bill [name] MR#:  [id number] DATE OF BIRTH:  1945-10-13  DATE OF ADMISSION:  03/30/2014  PRIMARY CARE PHYSICIAN:  Demetrios Isaacsonald E. Fisher, MD.  CHIEF COMPLAINT: Shortness of breath and altered mental status.   HISTORY OF PRESENT ILLNESS: This is a 69 year old male who presented to the Emergency Room recently from an eye doctor's appointment, as he was having some shortness of breath there.  He presented to the Emergency Room and was noted to have ST elevations in the inferior leads and was also noted to be acutely hypotensive with systolic blood pressures in the 70s. He was thought to have an ST-elevation MI and therefore urgently rushed to the catheterization laboratory. The patient underwent a cardiac catheterization which showed no significant coronary artery disease, but global LV dysfunction, EF of 40%. Post catheter, the patient still remains lethargic and confused. Therefore, hospitalist services were contacted for admission. The patient presently denies any chest pain, any shortness of breath, nausea, vomiting, abdominal pain, or any other associated symptoms presently. The patient, himself, is quite lethargic and therefore a poor historian.   REVIEW OF SYSTEMS: CONSTITUTIONAL: No documented fever. No weight gain, no weight loss.  EYES: No blurred or double vision.  EARS, NOSE, THROAT: No postnasal drip. No redness of the oropharynx.  RESPIRATORY: No cough, no wheeze, no hemoptysis. Positive dyspnea. Positive COPD.  CARDIOVASCULAR: No chest pain, no orthopnea, no palpitations, no syncope.  GASTROINTESTINAL: No nausea, vomiting, diarrhea, abdominal pain. No melena or hematochezia.  GENITOURINARY: No dysuria and hematuria.  ENDOCRINE: No polyuria or nocturia. No heat or cold intolerance.  HEMATOLOGIC: No anemia, no bruising, no bleeding.  INTEGUMENTARY: No rashes or lesions.  MUSCULOSKELETAL: No arthritis, no swelling, no gout.  NEUROLOGIC: No numbness or tingling. No ataxia. No  seizure activity.  PSYCHIATRIC: Positive depression. No ADD.   PAST MEDICAL HISTORY: Consistent with chronic back pain, COPD, CHF, depression, hyperlipidemia, hypertension, sleep apnea, history of diabetes, diabetic neuropathy.   ALLERGIES: AMOXICILLIN, PENICILLIN.   SOCIAL HISTORY: The patient used to be a smoker, quit 2 years back. No alcohol abuse. No illicit drug abuse. [facility].   FAMILY HISTORY: Mother and father are both deceased.  Diabetes runs in the family.   CURRENT MEDICATIONS: Symbicort 160/4.5 two puffs b.i.d., Celexa 40 mg daily, gabapentin 100 mg b.i.d., Levemir 40 units at bedtime, primidone 20 mg b.i.d., Flomax 0.4 mg daily, Spiriva 1 puff daily, Tylenol 650 q. 4 hours as needed, tramadol 50 mg b.i.d., albuterol inhaler as needed, cranberry supplements b.i.d., PreserVision 1 capsule daily, Imdur 30 mg daily, NovoLog 5 units t.i.d. with meals, DuoNebs q. 4-6 hours as needed, olanzapine 15 mg daily, Flonase 2 sprays to each nostril at bedtime, aspirin 81 mg daily, potassium 20 mEq daily, hydroxyzine 25 mg q. 8 hours as needed, Senokot 2 tablets b.i.d., torsemide 20 mg 3 tablets b.i.d., colchicine 0.6 mg daily, Klonopin 1 mg b.i.d., allopurinol 300 mg daily, Protonix 40 mg daily, prednisone 5 mg daily, Tradjenta 5 mg daily, Aldactone 50 mg b.i.d., Tylenol with hydrocodone 1 tablet q. 4 hours as needed, Ambien 10 mg at bedtime.   PHYSICAL EXAMINATION:  VITAL SIGNS: Temperature, the patient is afebrile.  Pulse in the 90s, respirations 18, blood pressure 117/80, saturations are 92% on 2 liters nasal cannula.  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  HEENT:  Atraumatic, normocephalic. Extraocular muscles are intact. Pupils equal and reactive to light. Sclerae anicteric. No conjunctival  injection. No pharyngeal erythema.  NECK: Supple. There is no jugular venous distention. No bruits. No thyromegaly.  HEART: Regular rate and  rhythm. No murmurs. No rubs. No clicks.  LUNGS: Poor respiratory effort, some minimal wheezing, end-expiratory. Negative use of accessory muscles. No dullness to percussion.  ABDOMEN: Soft, flat, nontender, nondistended. Has good bowel sounds. No hepatosplenomegaly appreciated.  EXTREMITIES: No evidence of any cyanosis, clubbing, or peripheral edema. Has +2 pedal and radial pulses bilaterally.  NEUROLOGICAL: The patient is alert, awake, and oriented x1, globally weak. Difficult do full neurological exam.  No focal, motor or sensory defecits appreciated bilaterally.  SKIN: Moist and warm with no rashes.   LYMPHATIC: There is no cervical or axillary.   LABORATORY DATA: Serum glucose of 222. BUN 134, creatinine 1.8, sodium 128, potassium 3.4, chloride 80, bicarbonate 38. The patient's albumin is 2.6. Troponin less than 0.02. White cell count 16.6, hemoglobin 12.1, hematocrit 38.4, platelet count 431,000. INR is 2.0. ABG showed a pH of 7.44, pCO2 of 62, pO2 of 56, saturations 93%.   ASSESSMENT AND PLAN: This is a 69 year old male with a history of hypertension, hyperlipidemia, diabetes, morbid obesity, obstructive sleep apnea, BPH, chronic obstructive pulmonary disease, depression, who presented to the hospital with shortness of breath, hypotension and ST elevations in inferior leads, suspicious for ST-elevation MI. The patient is status post cardiac catheterization which showed no significant coronary artery disease. The patient remains to be lethargic and confused and therefore is being admitted to the hospital service.  1.  Altered mental status; the exact etiology of this is unclear, seems likely metabolic in nature. I will get a CT head, check ammonia and repeat an ABG, which looks okay and follow mental status. I will hold all  patient sedative medications including his psychiatric medications for now until his mental status improves.. We will check a urinalysis too.  2.  Acute renal failure. This is  likely prerenal in nature, related to dehydration. I will aggressively hydrate the patient with IV fluids, follow up BUN and creatinine and urine output, renal dose medications, avoid nephrotoxins, hold his diuretics for now. 3.  Chronic obstructive pulmonary disease. No acute exacerbation. Continue Symbicort, Spiriva and p.r.n. DuoNebs.  4.  BPH. I will continue his Flomax.  I will check a bladder scan. If it is greater than 300 mL, then will do in and out catheterization to check a urinalysis.  5.  Depression. I will hold all his sedative medications for now, until his mental status improves. 6.  Diabetes.  Since he is going to be n.p.o., I will hold his Levemir and NovoLog with meals and place on sliding scale insulin coverage. 7.  History of congestive heart failure. Clinically, the patient does not appear to be in CHF.  I will hold his diuretics for now.  8.  Suspected ST-elevation myocardial infarction. This was the clinical diagnosis on admission given his acute ST changes. The patient is status post catheterization showing no acute coronary disease that needed intervention. The patient did have some LV dysfunction. Cardiology has been consulted. They will continue to follow. The patient is currently chest pain free.  I will continue his aspirin for now. The patient apparently is followed by hospice services at the assisted living. He is presently a DNI/DNR.  I will get a palliative care consult for Monday.   TIME SPENT WITH THE ADMISSION: 50 minutes.    ____________________________ Rolly Pancake. Cherlynn Kaiser, MD vjs:ds D: 03/30/2014 17:51:41 ET T: 03/30/2014 18:15:36 ET JOB#:  [id number]  cc: Rolly Pancake. Cherlynn Kaiser, MD, <Dictator> Houston Siren MD ELECTRONICALLY SIGNED 04/13/2014 14:23

## 2015-08-26 IMAGING — CR DG CHEST 2V
1 series · 2 of 2 positions shown · non-contrast
Comparison: none

REASON FOR EXAM: SYNCOPE, SHORT OF BREATH
COMMENTS:

PROCEDURE:     DXR - DXR CHEST PA (OR AP) AND LATERAL  - June 12, 2013  [DATE]
RESULT:     Mediastinal hilar structures are normal. The lungs are clear.
Heart size is enlarged, no CHF. Previously identified atelectasis in the
lung bases on 05/06/2012 has cleared.

[Series 1: w chest pa · 0.14mm/px · 2 of 2 slices shown]
[im 1/2]
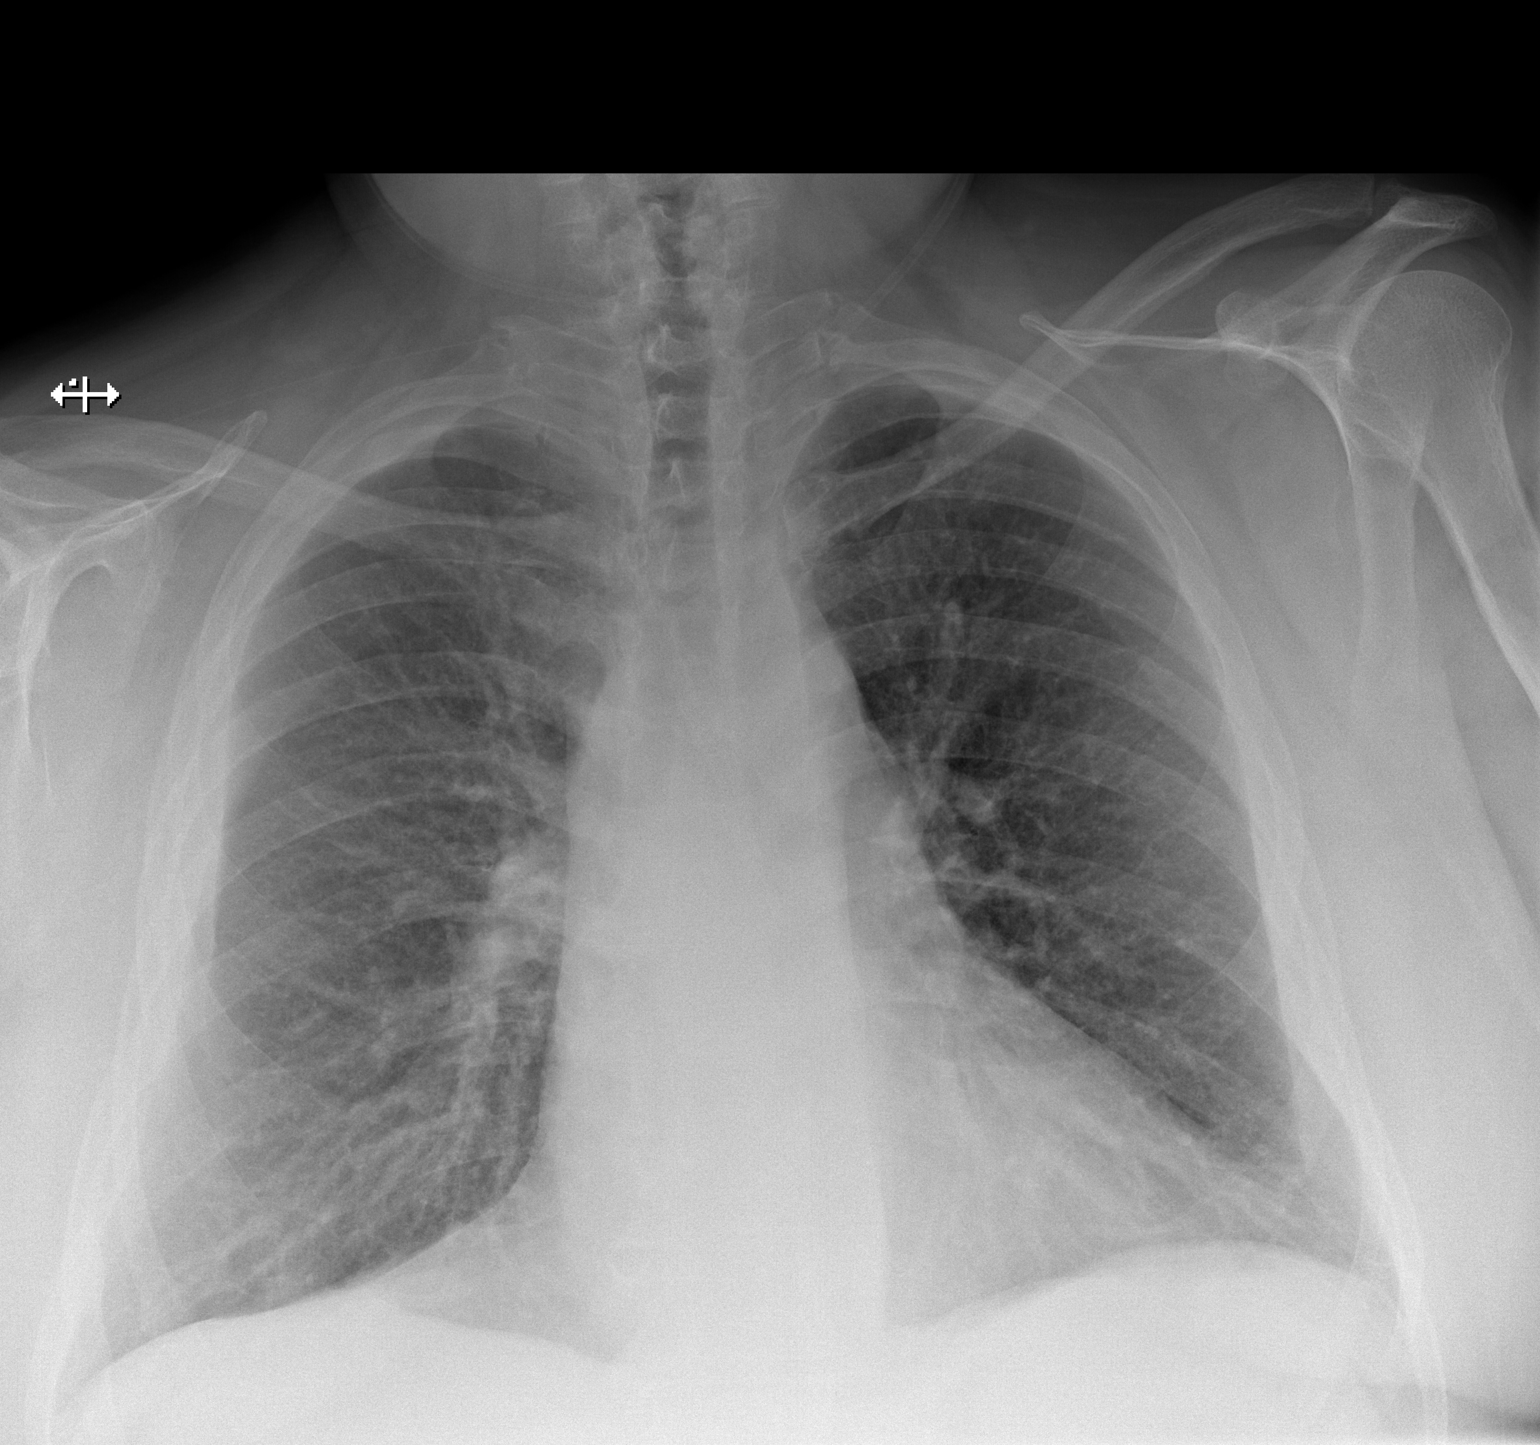
[im 2/2]
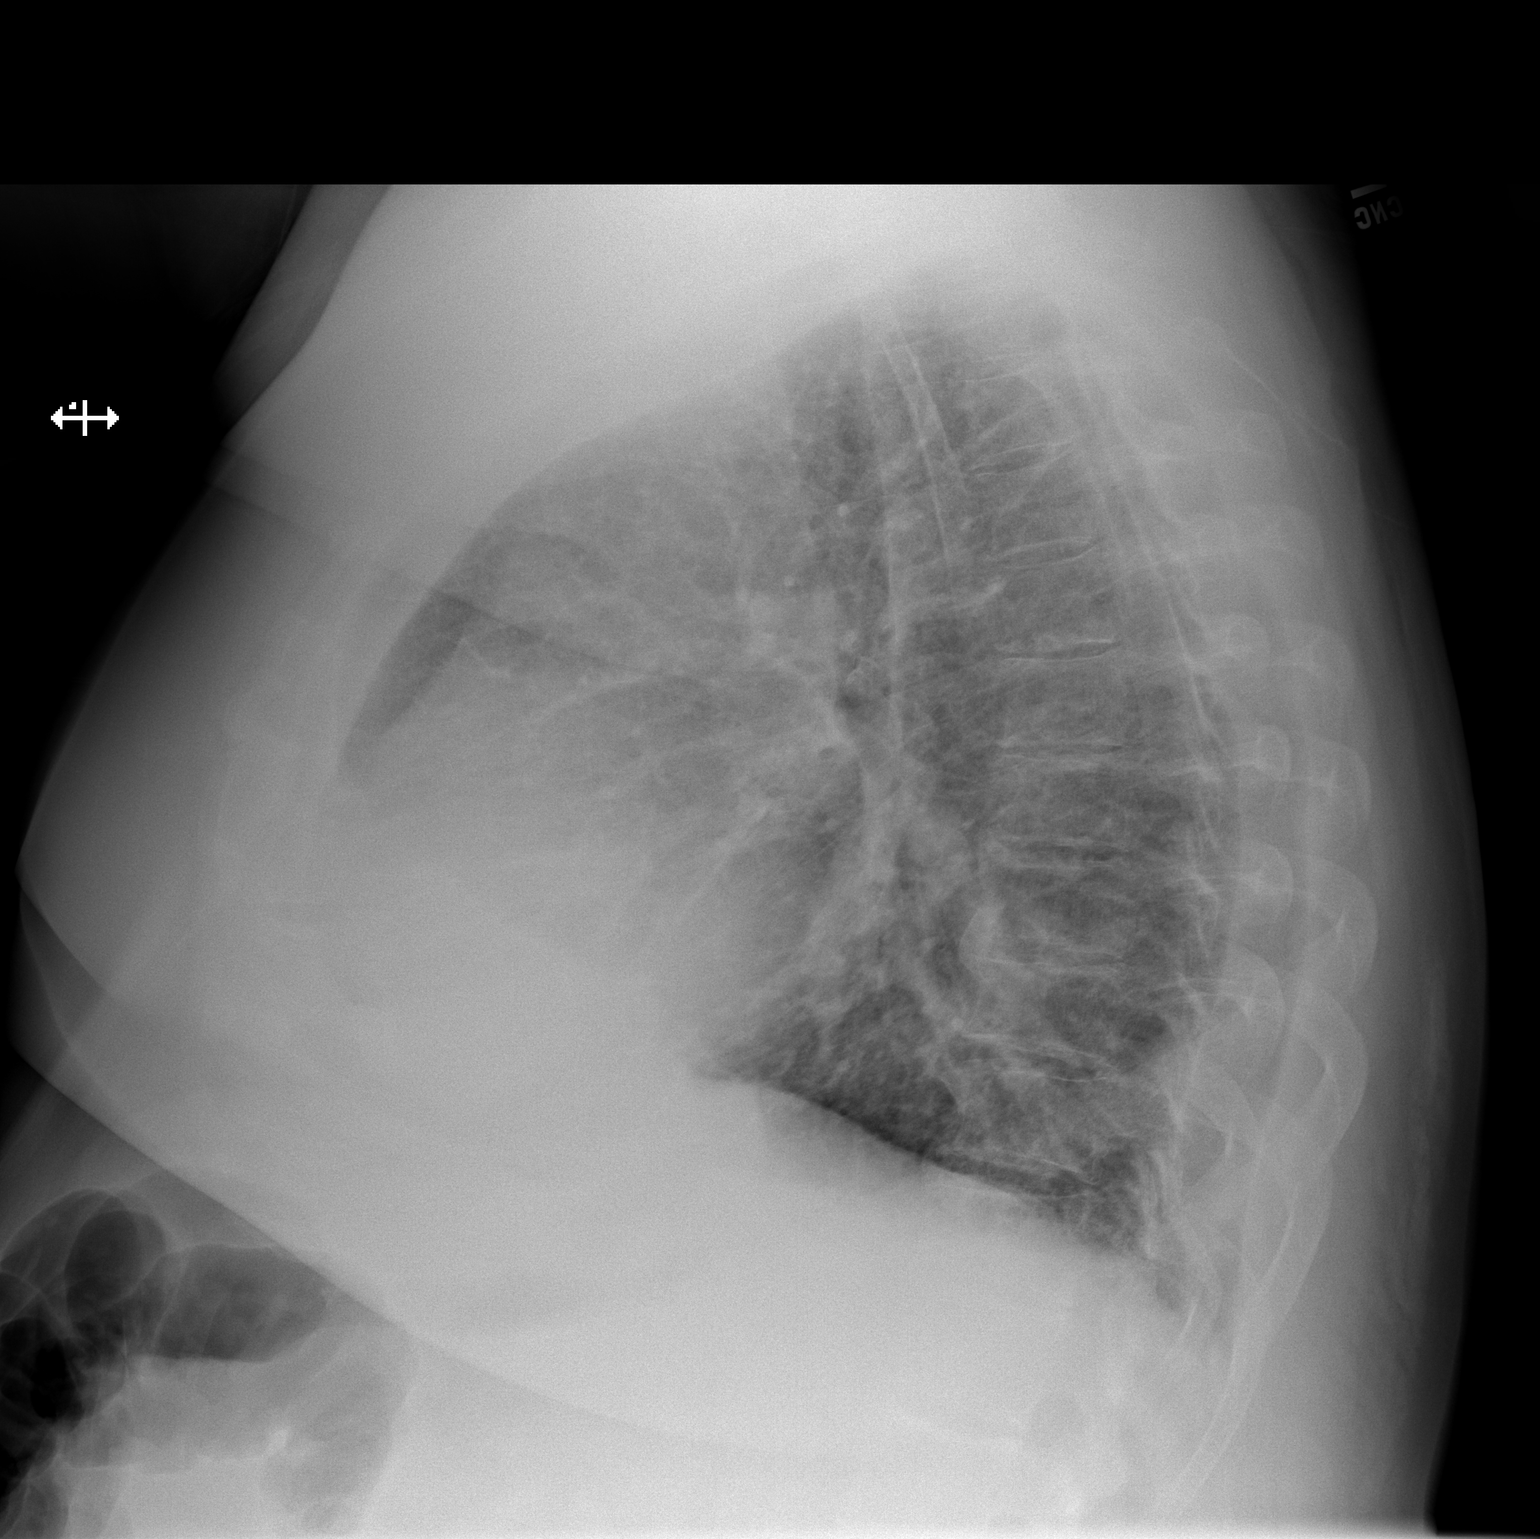

[2 of 2 positions shown; findings below may reference images not displayed]

IMPRESSION: Stable cardiomegaly. No infiltrate.

## 2015-08-26 IMAGING — CT CT ANGIO CHEST
1 series · 20 of 33 positions shown · IV contrast (APPLIED)
Comparison: none

REASON FOR EXAM: shortness of breath
COMMENTS:

PROCEDURE:     CT  - CT ANGIOGRAPHY CHEST W FOR PE  - June 12, 2013  [DATE]
RESULT:     History: Shortness of breath.
Comparison Study: No prior.

[Series 4: soft tissue · axial · 0.84mm/px · z∈[-77,+229]mm · 20 of 112 slices shown]
[im 5/112  lung]
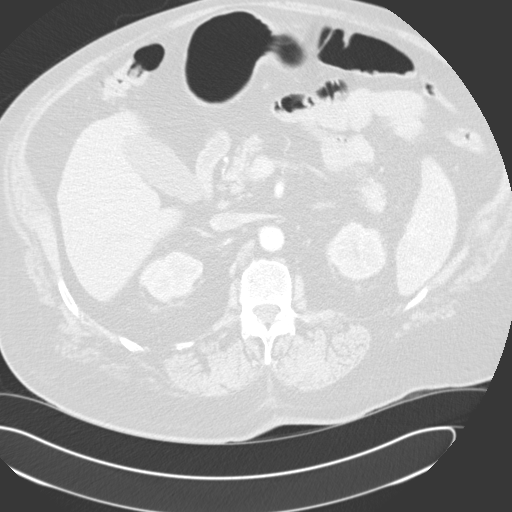
[im 13/112  mediastinal]
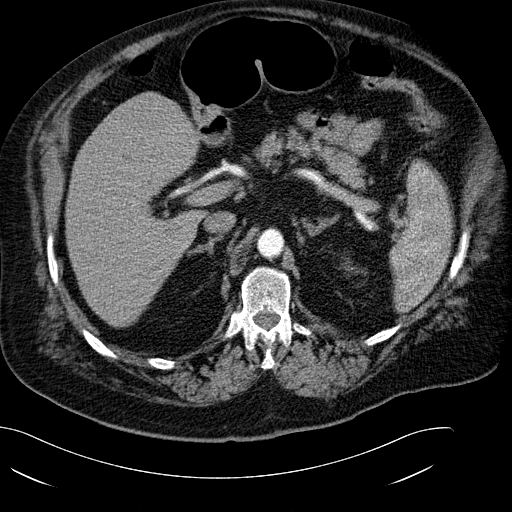
[im 17/112  lung]
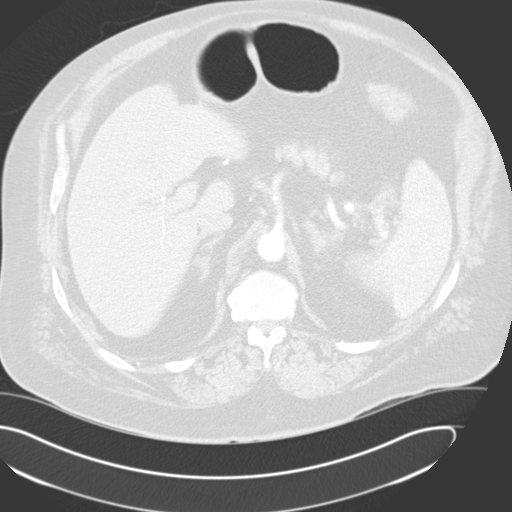
[im 23/112  mediastinal]
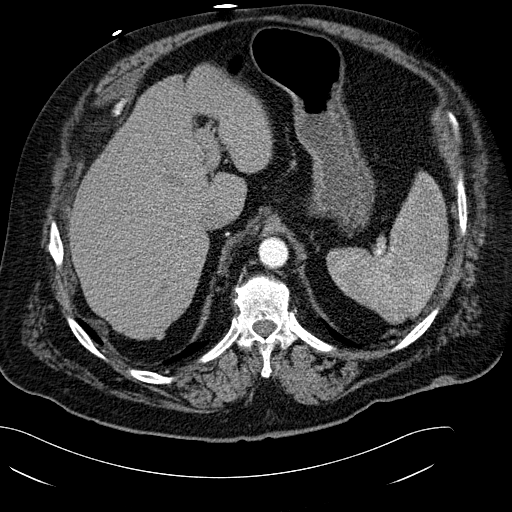
[im 25/112  lung]
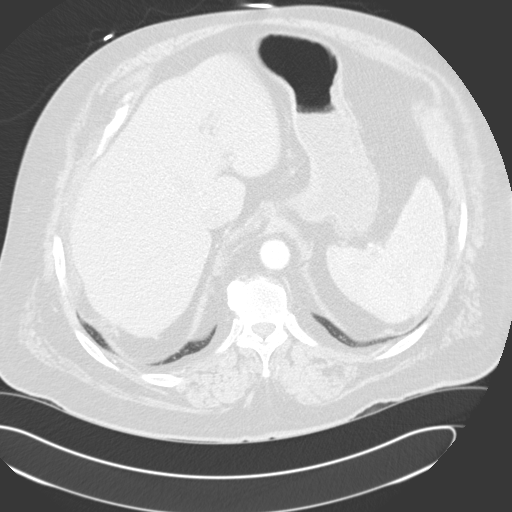
[im 33/112  mediastinal]
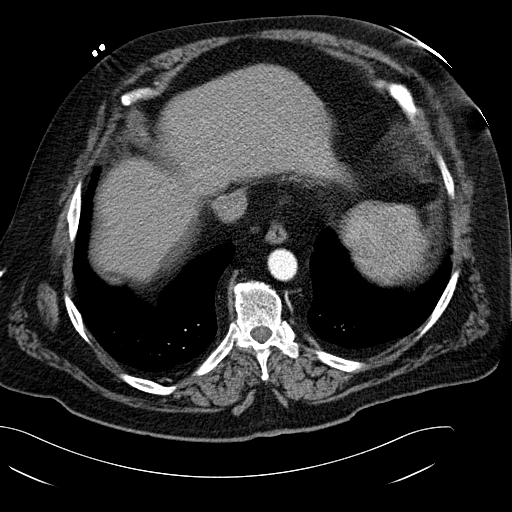
[im 42/112  lung]
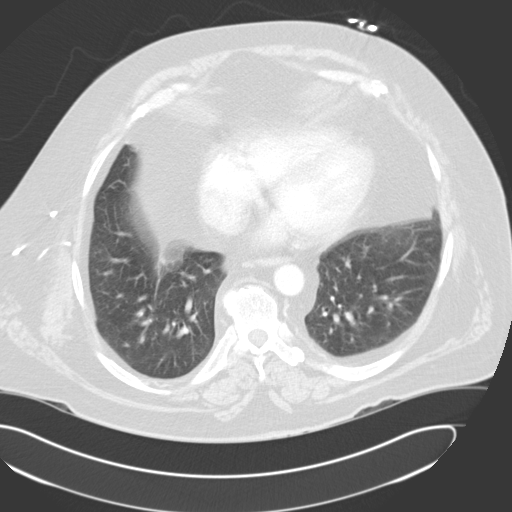
[im 45/112  mediastinal]
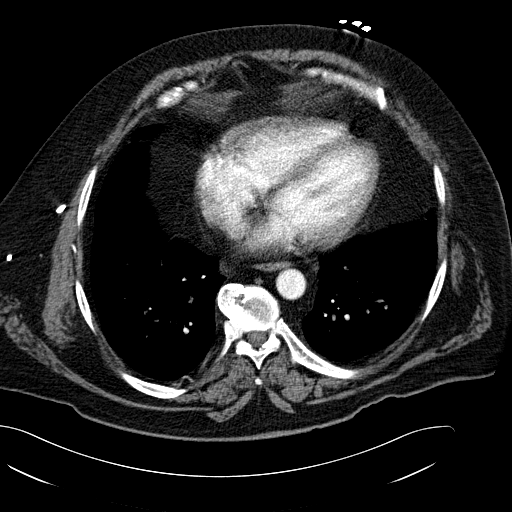
[im 50/112  lung]
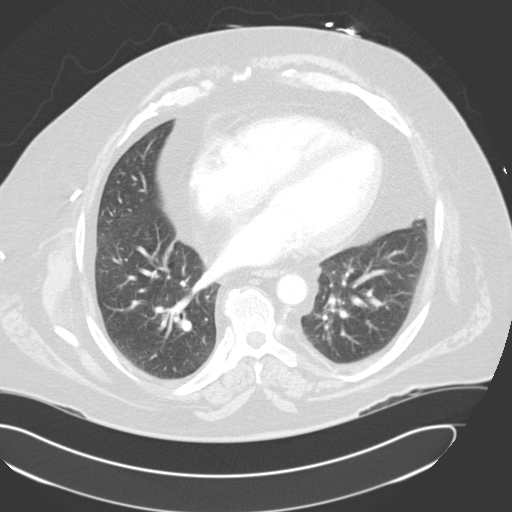
[im 54/112  mediastinal]
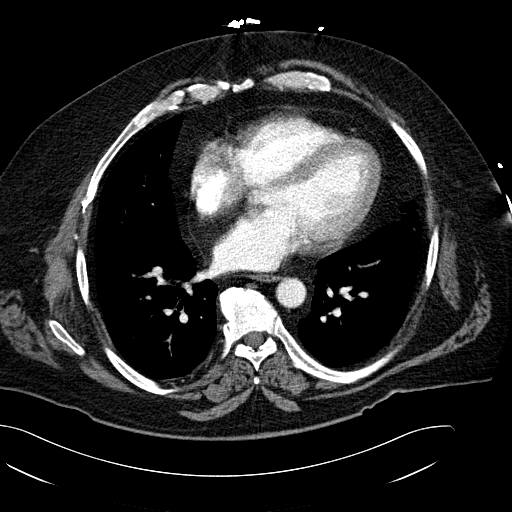
[im 59/112  lung]
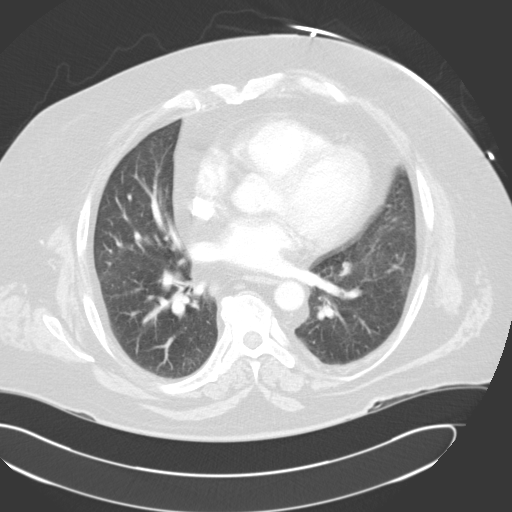
[im 62/112  mediastinal]
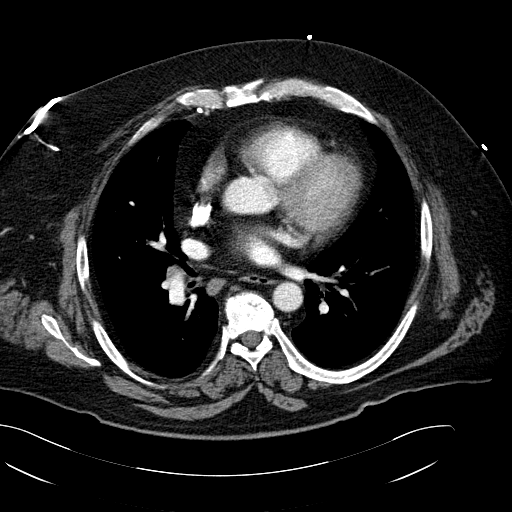
[im 67/112  lung]
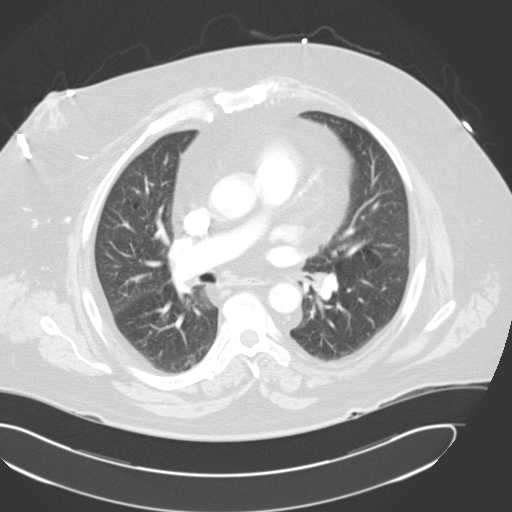
[im 70/112  mediastinal]
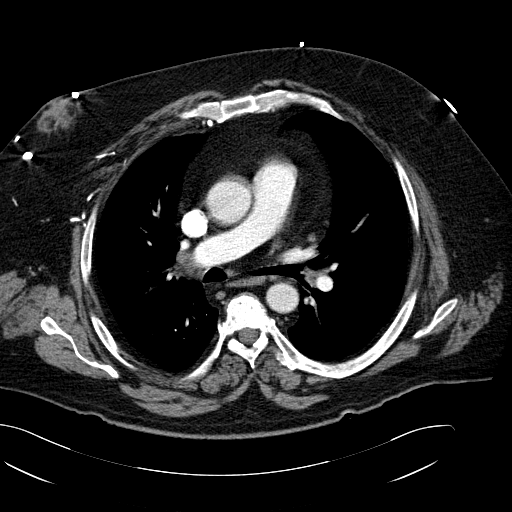
[im 79/112  lung]
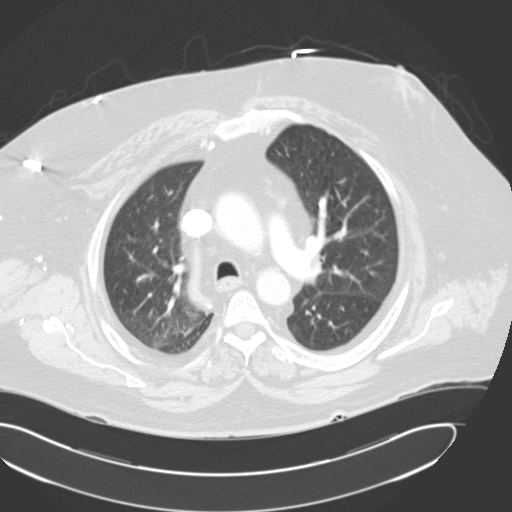
[im 87/112  mediastinal]
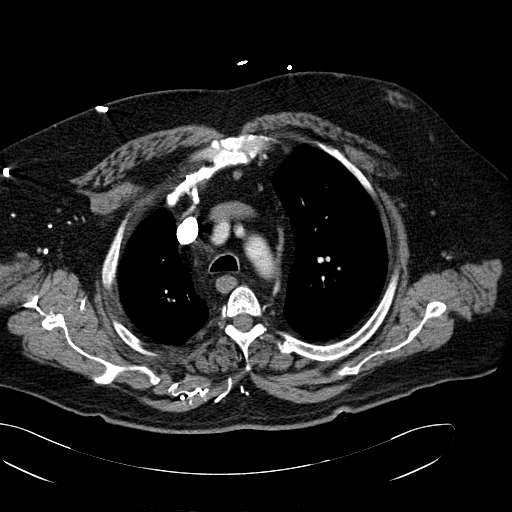
[im 89/112  lung]
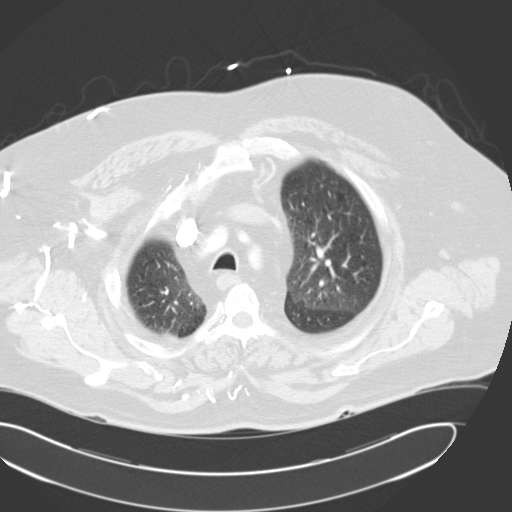
[im 95/112  mediastinal]
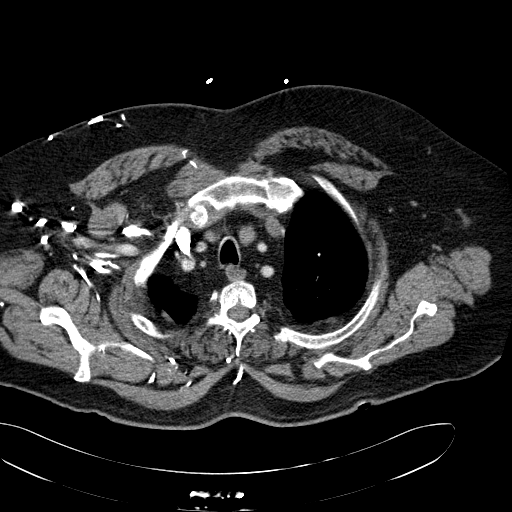
[im 99/112  lung]
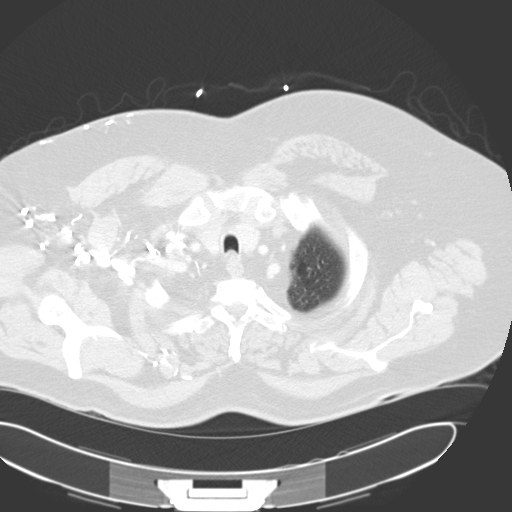
[im 107/112  mediastinal]
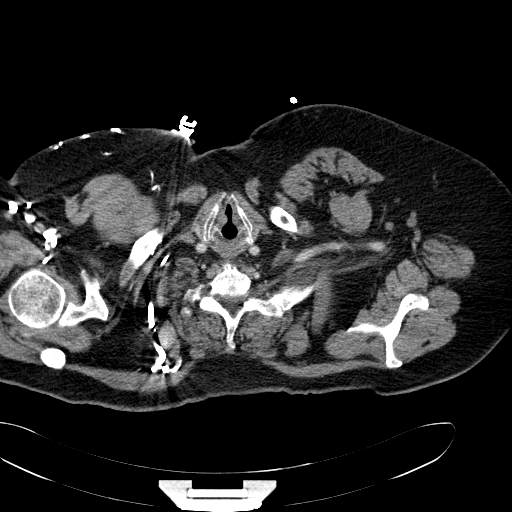

[20 of 33 positions shown; findings below may reference images not displayed]

FINDINGS: Standard contrast enhanced CT performed 100 cc of [id number].
Evaluation in 3 dimensions on separate workstation performed. Thoracic aorta
unremarkable. Coronary artery disease. Cardiomegaly. Pulmonary arteries are
normal. There are shotty mediastinal lymph nodes. Large airways patent. Mild
interstitial prominence noted. Pneumonitis and/or CHF could present in this
fashion. Adrenals are normal. Degenerative changes thoracic spine.
IMPRESSION: Cardiomegaly with mild interstitial prominence,mild CHF
cannot be excluded. Mild pneumonitis cannot be excluded. Coronary artery
disease. No PE.

## 2016-06-14 IMAGING — CR DG CHEST 1V PORT
1 series · 1 of 1 positions shown · non-contrast
Comparison: 03/30/2014

CLINICAL DATA: Hypoxia

EXAM:
PORTABLE CHEST - 1 VIEW

[ap]
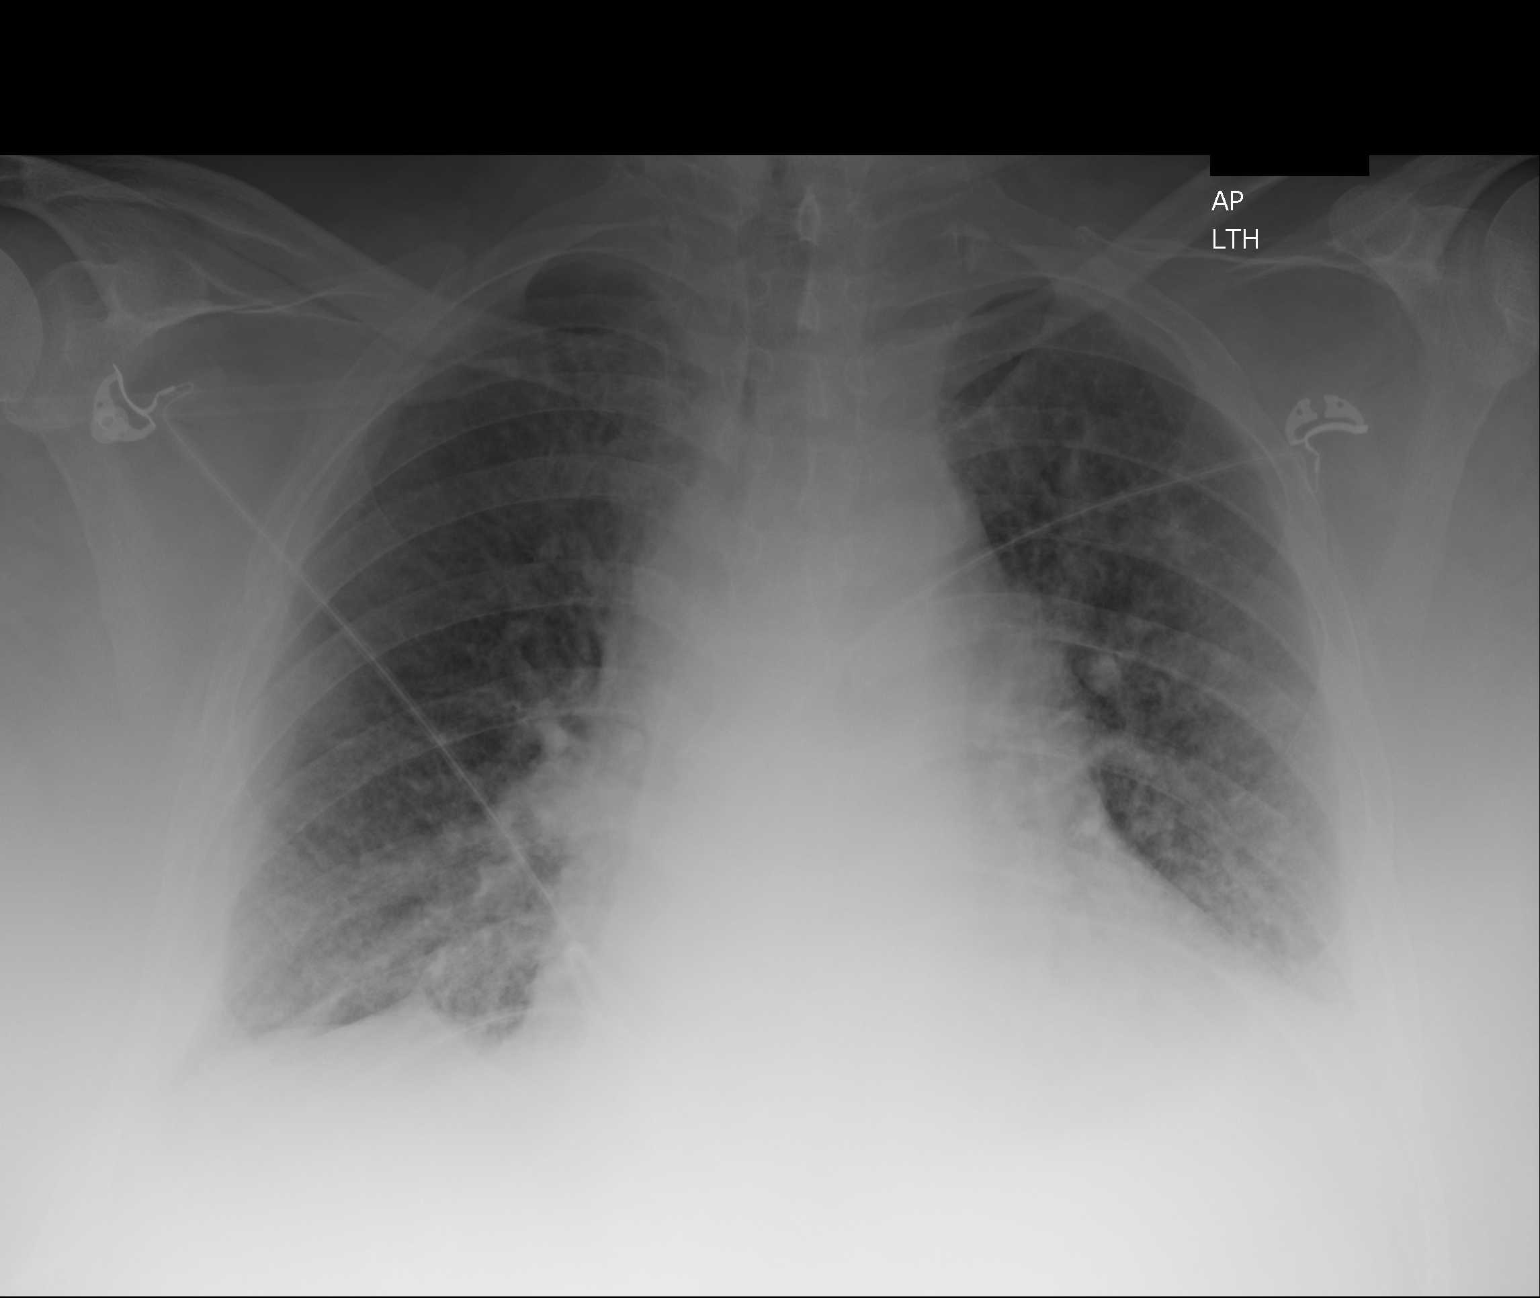

[1 of 1 positions shown; findings below may reference images not displayed]

FINDINGS: There is persistent cardiomegaly with central vascular prominence.
Small pleural effusions are stable. No significant change in diffuse
prominence of the interstitial markings without lobar opacity.
IMPRESSION: Stable cardiomegaly with small effusions and possible interstitial
edema.
